# Patient Record
Sex: Female | Born: 1940 | Race: Black or African American | Hispanic: No | Smoking: Never smoker
Health system: Southern US, Community
[De-identification: ages and names within clinical notes are randomized; demographics above are authoritative.]

## PROBLEM LIST (undated history)

## (undated) DIAGNOSIS — C801 Malignant (primary) neoplasm, unspecified: Secondary | ICD-10-CM

## (undated) DIAGNOSIS — H409 Unspecified glaucoma: Secondary | ICD-10-CM

## (undated) DIAGNOSIS — M8580 Other specified disorders of bone density and structure, unspecified site: Secondary | ICD-10-CM

## (undated) DIAGNOSIS — R011 Cardiac murmur, unspecified: Secondary | ICD-10-CM

## (undated) DIAGNOSIS — I6529 Occlusion and stenosis of unspecified carotid artery: Secondary | ICD-10-CM

## (undated) DIAGNOSIS — R519 Headache, unspecified: Secondary | ICD-10-CM

## (undated) DIAGNOSIS — H3530 Unspecified macular degeneration: Secondary | ICD-10-CM

## (undated) DIAGNOSIS — IMO0001 Reserved for inherently not codable concepts without codable children: Secondary | ICD-10-CM

## (undated) DIAGNOSIS — M542 Cervicalgia: Secondary | ICD-10-CM

## (undated) DIAGNOSIS — M1990 Unspecified osteoarthritis, unspecified site: Secondary | ICD-10-CM

## (undated) DIAGNOSIS — E785 Hyperlipidemia, unspecified: Secondary | ICD-10-CM

## (undated) DIAGNOSIS — G459 Transient cerebral ischemic attack, unspecified: Secondary | ICD-10-CM

## (undated) DIAGNOSIS — D869 Sarcoidosis, unspecified: Secondary | ICD-10-CM

## (undated) DIAGNOSIS — I499 Cardiac arrhythmia, unspecified: Secondary | ICD-10-CM

## (undated) DIAGNOSIS — K635 Polyp of colon: Secondary | ICD-10-CM

## (undated) DIAGNOSIS — I341 Nonrheumatic mitral (valve) prolapse: Secondary | ICD-10-CM

## (undated) DIAGNOSIS — E7800 Pure hypercholesterolemia, unspecified: Secondary | ICD-10-CM

## (undated) DIAGNOSIS — R2240 Localized swelling, mass and lump, unspecified lower limb: Secondary | ICD-10-CM

## (undated) DIAGNOSIS — R51 Headache: Secondary | ICD-10-CM

## (undated) DIAGNOSIS — M1710 Unilateral primary osteoarthritis, unspecified knee: Secondary | ICD-10-CM

## (undated) HISTORY — DX: Polyp of colon: K63.5

## (undated) HISTORY — PX: TONSILLECTOMY: SUR1361

## (undated) HISTORY — PX: OTHER SURGICAL HISTORY: SHX169

## (undated) HISTORY — PX: EYE SURGERY: SHX253

## (undated) HISTORY — DX: Transient cerebral ischemic attack, unspecified: G45.9

## (undated) HISTORY — DX: Nonrheumatic mitral (valve) prolapse: I34.1

## (undated) HISTORY — PX: DILATION AND CURETTAGE OF UTERUS: SHX78

## (undated) HISTORY — DX: Unspecified macular degeneration: H35.30

## (undated) HISTORY — PX: ABDOMINAL HYSTERECTOMY: SHX81

## (undated) HISTORY — DX: Malignant (primary) neoplasm, unspecified: C80.1

## (undated) HISTORY — DX: Other specified disorders of bone density and structure, unspecified site: M85.80

## (undated) HISTORY — DX: Sarcoidosis, unspecified: D86.9

## (undated) HISTORY — DX: Cardiac arrhythmia, unspecified: I49.9

---

## 1998-07-07 ENCOUNTER — Ambulatory Visit (HOSPITAL_COMMUNITY): Admission: RE | Admit: 1998-07-07 | Discharge: 1998-07-07 | Payer: Self-pay | Admitting: Endocrinology

## 1998-07-09 ENCOUNTER — Ambulatory Visit (HOSPITAL_COMMUNITY): Admission: RE | Admit: 1998-07-09 | Discharge: 1998-07-09 | Payer: Self-pay | Admitting: Endocrinology

## 1998-07-09 ENCOUNTER — Encounter: Payer: Self-pay | Admitting: Endocrinology

## 1998-11-25 ENCOUNTER — Ambulatory Visit: Admission: RE | Admit: 1998-11-25 | Discharge: 1998-11-25 | Payer: Self-pay | Admitting: Cardiology

## 1999-05-19 ENCOUNTER — Emergency Department (HOSPITAL_COMMUNITY): Admission: EM | Admit: 1999-05-19 | Discharge: 1999-05-19 | Payer: Self-pay | Admitting: Emergency Medicine

## 2000-02-25 ENCOUNTER — Encounter: Admission: RE | Admit: 2000-02-25 | Discharge: 2000-02-25 | Payer: Self-pay | Admitting: Endocrinology

## 2000-02-25 ENCOUNTER — Encounter: Payer: Self-pay | Admitting: Endocrinology

## 2000-03-09 ENCOUNTER — Encounter: Payer: Self-pay | Admitting: Endocrinology

## 2000-03-09 ENCOUNTER — Encounter: Admission: RE | Admit: 2000-03-09 | Discharge: 2000-03-09 | Payer: Self-pay | Admitting: Endocrinology

## 2000-03-13 ENCOUNTER — Encounter: Payer: Self-pay | Admitting: Endocrinology

## 2000-03-13 ENCOUNTER — Inpatient Hospital Stay (HOSPITAL_COMMUNITY): Admission: EM | Admit: 2000-03-13 | Discharge: 2000-03-18 | Payer: Self-pay | Admitting: Emergency Medicine

## 2000-06-13 ENCOUNTER — Emergency Department (HOSPITAL_COMMUNITY): Admission: EM | Admit: 2000-06-13 | Discharge: 2000-06-13 | Payer: Self-pay | Admitting: Emergency Medicine

## 2000-07-06 ENCOUNTER — Encounter: Admission: RE | Admit: 2000-07-06 | Discharge: 2000-07-06 | Payer: Self-pay | Admitting: Endocrinology

## 2000-07-06 ENCOUNTER — Encounter: Payer: Self-pay | Admitting: Endocrinology

## 2000-12-01 ENCOUNTER — Ambulatory Visit: Admission: RE | Admit: 2000-12-01 | Discharge: 2000-12-01 | Payer: Self-pay | Admitting: Internal Medicine

## 2000-12-12 ENCOUNTER — Ambulatory Visit: Admission: RE | Admit: 2000-12-12 | Discharge: 2000-12-12 | Payer: Self-pay | Admitting: Internal Medicine

## 2001-05-21 ENCOUNTER — Emergency Department (HOSPITAL_COMMUNITY): Admission: EM | Admit: 2001-05-21 | Discharge: 2001-05-21 | Payer: Self-pay | Admitting: Emergency Medicine

## 2001-05-21 ENCOUNTER — Encounter: Payer: Self-pay | Admitting: Emergency Medicine

## 2001-09-11 ENCOUNTER — Other Ambulatory Visit: Admission: RE | Admit: 2001-09-11 | Discharge: 2001-09-11 | Payer: Self-pay | Admitting: Gynecology

## 2001-10-09 ENCOUNTER — Emergency Department (HOSPITAL_COMMUNITY): Admission: EM | Admit: 2001-10-09 | Discharge: 2001-10-09 | Payer: Self-pay | Admitting: Emergency Medicine

## 2002-01-04 ENCOUNTER — Ambulatory Visit (HOSPITAL_COMMUNITY): Admission: RE | Admit: 2002-01-04 | Discharge: 2002-01-04 | Payer: Self-pay | Admitting: *Deleted

## 2002-11-09 ENCOUNTER — Encounter: Payer: Self-pay | Admitting: Endocrinology

## 2002-11-09 ENCOUNTER — Ambulatory Visit (HOSPITAL_COMMUNITY): Admission: RE | Admit: 2002-11-09 | Discharge: 2002-11-09 | Payer: Self-pay | Admitting: Endocrinology

## 2003-04-03 ENCOUNTER — Encounter: Payer: Self-pay | Admitting: Endocrinology

## 2003-04-03 ENCOUNTER — Encounter: Admission: RE | Admit: 2003-04-03 | Discharge: 2003-04-03 | Payer: Self-pay | Admitting: Endocrinology

## 2003-10-22 ENCOUNTER — Emergency Department (HOSPITAL_COMMUNITY): Admission: EM | Admit: 2003-10-22 | Discharge: 2003-10-22 | Payer: Self-pay | Admitting: Emergency Medicine

## 2004-04-23 ENCOUNTER — Encounter: Admission: RE | Admit: 2004-04-23 | Discharge: 2004-04-23 | Payer: Self-pay | Admitting: Endocrinology

## 2004-05-02 ENCOUNTER — Emergency Department (HOSPITAL_COMMUNITY): Admission: EM | Admit: 2004-05-02 | Discharge: 2004-05-02 | Payer: Self-pay | Admitting: Emergency Medicine

## 2004-09-25 ENCOUNTER — Ambulatory Visit: Payer: Self-pay | Admitting: Internal Medicine

## 2005-01-07 ENCOUNTER — Ambulatory Visit: Payer: Self-pay | Admitting: Internal Medicine

## 2005-02-10 ENCOUNTER — Ambulatory Visit: Payer: Self-pay | Admitting: Internal Medicine

## 2005-04-12 ENCOUNTER — Emergency Department (HOSPITAL_COMMUNITY): Admission: EM | Admit: 2005-04-12 | Discharge: 2005-04-12 | Payer: Self-pay | Admitting: *Deleted

## 2005-04-13 ENCOUNTER — Ambulatory Visit: Payer: Self-pay | Admitting: Internal Medicine

## 2005-04-28 ENCOUNTER — Ambulatory Visit: Payer: Self-pay | Admitting: Internal Medicine

## 2005-05-27 ENCOUNTER — Ambulatory Visit: Payer: Self-pay | Admitting: Internal Medicine

## 2005-07-08 ENCOUNTER — Ambulatory Visit: Payer: Self-pay | Admitting: Internal Medicine

## 2005-07-21 ENCOUNTER — Ambulatory Visit: Payer: Self-pay | Admitting: Pulmonary Disease

## 2005-07-27 ENCOUNTER — Ambulatory Visit: Payer: Self-pay | Admitting: Internal Medicine

## 2005-07-27 ENCOUNTER — Encounter: Admission: RE | Admit: 2005-07-27 | Discharge: 2005-07-27 | Payer: Self-pay | Admitting: Endocrinology

## 2005-08-10 ENCOUNTER — Ambulatory Visit: Payer: Self-pay | Admitting: Internal Medicine

## 2005-08-30 ENCOUNTER — Ambulatory Visit: Payer: Self-pay | Admitting: Pulmonary Disease

## 2005-09-23 ENCOUNTER — Ambulatory Visit: Payer: Self-pay | Admitting: Internal Medicine

## 2005-12-22 ENCOUNTER — Ambulatory Visit: Payer: Self-pay | Admitting: Internal Medicine

## 2006-01-17 ENCOUNTER — Ambulatory Visit: Payer: Self-pay | Admitting: Internal Medicine

## 2006-02-14 ENCOUNTER — Ambulatory Visit: Payer: Self-pay | Admitting: Internal Medicine

## 2006-03-23 ENCOUNTER — Emergency Department (HOSPITAL_COMMUNITY): Admission: EM | Admit: 2006-03-23 | Discharge: 2006-03-24 | Payer: Self-pay | Admitting: Emergency Medicine

## 2006-03-25 ENCOUNTER — Ambulatory Visit: Payer: Self-pay | Admitting: Internal Medicine

## 2006-04-05 ENCOUNTER — Ambulatory Visit: Payer: Self-pay | Admitting: Internal Medicine

## 2006-07-06 ENCOUNTER — Ambulatory Visit: Payer: Self-pay | Admitting: Critical Care Medicine

## 2006-07-13 ENCOUNTER — Ambulatory Visit: Payer: Self-pay | Admitting: Internal Medicine

## 2006-07-28 ENCOUNTER — Ambulatory Visit (HOSPITAL_COMMUNITY): Admission: RE | Admit: 2006-07-28 | Discharge: 2006-07-29 | Payer: Self-pay | Admitting: Ophthalmology

## 2006-08-31 ENCOUNTER — Ambulatory Visit: Payer: Self-pay | Admitting: Internal Medicine

## 2006-10-11 ENCOUNTER — Ambulatory Visit: Payer: Self-pay | Admitting: Internal Medicine

## 2007-01-23 ENCOUNTER — Ambulatory Visit: Payer: Self-pay | Admitting: Internal Medicine

## 2007-04-24 ENCOUNTER — Ambulatory Visit: Payer: Self-pay | Admitting: Internal Medicine

## 2007-05-15 ENCOUNTER — Ambulatory Visit: Payer: Self-pay | Admitting: Internal Medicine

## 2007-07-04 ENCOUNTER — Emergency Department (HOSPITAL_COMMUNITY): Admission: EM | Admit: 2007-07-04 | Discharge: 2007-07-04 | Payer: Self-pay | Admitting: Emergency Medicine

## 2007-07-21 ENCOUNTER — Ambulatory Visit: Payer: Self-pay | Admitting: Internal Medicine

## 2007-08-22 DIAGNOSIS — R05 Cough: Secondary | ICD-10-CM

## 2007-08-22 DIAGNOSIS — D869 Sarcoidosis, unspecified: Secondary | ICD-10-CM

## 2007-09-14 ENCOUNTER — Telehealth (INDEPENDENT_AMBULATORY_CARE_PROVIDER_SITE_OTHER): Payer: Self-pay | Admitting: *Deleted

## 2007-10-02 ENCOUNTER — Telehealth (INDEPENDENT_AMBULATORY_CARE_PROVIDER_SITE_OTHER): Payer: Self-pay | Admitting: *Deleted

## 2007-10-02 ENCOUNTER — Telehealth (INDEPENDENT_AMBULATORY_CARE_PROVIDER_SITE_OTHER): Payer: Self-pay

## 2007-11-30 ENCOUNTER — Ambulatory Visit: Payer: Self-pay | Admitting: Internal Medicine

## 2008-02-05 ENCOUNTER — Ambulatory Visit: Payer: Self-pay | Admitting: Internal Medicine

## 2008-05-13 ENCOUNTER — Telehealth (INDEPENDENT_AMBULATORY_CARE_PROVIDER_SITE_OTHER): Payer: Self-pay | Admitting: *Deleted

## 2008-08-06 ENCOUNTER — Ambulatory Visit: Payer: Self-pay | Admitting: Internal Medicine

## 2008-08-06 DIAGNOSIS — J984 Other disorders of lung: Secondary | ICD-10-CM

## 2008-11-17 ENCOUNTER — Emergency Department (HOSPITAL_COMMUNITY): Admission: EM | Admit: 2008-11-17 | Discharge: 2008-11-17 | Payer: Self-pay | Admitting: Emergency Medicine

## 2009-01-10 ENCOUNTER — Encounter: Payer: Self-pay | Admitting: Internal Medicine

## 2009-01-10 ENCOUNTER — Ambulatory Visit: Payer: Self-pay | Admitting: Internal Medicine

## 2009-02-05 ENCOUNTER — Ambulatory Visit: Payer: Self-pay | Admitting: Internal Medicine

## 2009-02-26 ENCOUNTER — Emergency Department (HOSPITAL_COMMUNITY): Admission: EM | Admit: 2009-02-26 | Discharge: 2009-02-26 | Payer: Self-pay | Admitting: Emergency Medicine

## 2009-04-04 ENCOUNTER — Ambulatory Visit: Payer: Self-pay | Admitting: Internal Medicine

## 2009-05-08 ENCOUNTER — Ambulatory Visit: Payer: Self-pay | Admitting: Internal Medicine

## 2009-07-20 ENCOUNTER — Ambulatory Visit: Payer: Self-pay | Admitting: Vascular Surgery

## 2009-07-20 ENCOUNTER — Ambulatory Visit: Payer: Self-pay | Admitting: Cardiology

## 2009-07-20 ENCOUNTER — Observation Stay (HOSPITAL_COMMUNITY): Admission: EM | Admit: 2009-07-20 | Discharge: 2009-07-22 | Payer: Self-pay | Admitting: Emergency Medicine

## 2009-07-21 ENCOUNTER — Encounter (INDEPENDENT_AMBULATORY_CARE_PROVIDER_SITE_OTHER): Payer: Self-pay | Admitting: Internal Medicine

## 2009-08-11 ENCOUNTER — Telehealth: Payer: Self-pay | Admitting: Internal Medicine

## 2009-08-18 HISTORY — PX: OTHER SURGICAL HISTORY: SHX169

## 2009-08-19 ENCOUNTER — Ambulatory Visit: Payer: Self-pay | Admitting: Internal Medicine

## 2009-09-02 ENCOUNTER — Ambulatory Visit: Payer: Self-pay | Admitting: Pulmonary Disease

## 2009-09-08 ENCOUNTER — Encounter: Payer: Self-pay | Admitting: Internal Medicine

## 2009-09-08 ENCOUNTER — Ambulatory Visit: Payer: Self-pay | Admitting: Internal Medicine

## 2009-11-07 ENCOUNTER — Ambulatory Visit: Payer: Self-pay | Admitting: Internal Medicine

## 2009-12-26 ENCOUNTER — Encounter: Payer: Self-pay | Admitting: Internal Medicine

## 2009-12-26 ENCOUNTER — Telehealth: Payer: Self-pay | Admitting: Internal Medicine

## 2009-12-30 ENCOUNTER — Ambulatory Visit (HOSPITAL_COMMUNITY): Admission: RE | Admit: 2009-12-30 | Discharge: 2009-12-31 | Payer: Self-pay | Admitting: Ophthalmology

## 2010-01-16 ENCOUNTER — Ambulatory Visit: Payer: Self-pay | Admitting: Internal Medicine

## 2010-01-17 ENCOUNTER — Emergency Department (HOSPITAL_COMMUNITY): Admission: EM | Admit: 2010-01-17 | Discharge: 2010-01-17 | Payer: Self-pay | Admitting: Emergency Medicine

## 2010-05-04 ENCOUNTER — Ambulatory Visit: Payer: Self-pay | Admitting: Internal Medicine

## 2010-05-15 ENCOUNTER — Emergency Department (HOSPITAL_COMMUNITY): Admission: EM | Admit: 2010-05-15 | Discharge: 2010-05-15 | Payer: Self-pay | Admitting: Emergency Medicine

## 2010-05-18 ENCOUNTER — Emergency Department (HOSPITAL_COMMUNITY): Admission: EM | Admit: 2010-05-18 | Discharge: 2010-05-18 | Payer: Self-pay | Admitting: Emergency Medicine

## 2010-05-20 ENCOUNTER — Ambulatory Visit: Payer: Self-pay | Admitting: Internal Medicine

## 2010-05-20 DIAGNOSIS — L272 Dermatitis due to ingested food: Secondary | ICD-10-CM | POA: Insufficient documentation

## 2010-05-21 ENCOUNTER — Encounter: Payer: Self-pay | Admitting: Internal Medicine

## 2010-06-03 ENCOUNTER — Ambulatory Visit: Payer: Self-pay | Admitting: Internal Medicine

## 2010-06-09 ENCOUNTER — Encounter: Payer: Self-pay | Admitting: Internal Medicine

## 2010-08-25 ENCOUNTER — Ambulatory Visit: Payer: Self-pay | Admitting: Internal Medicine

## 2010-09-10 ENCOUNTER — Emergency Department (HOSPITAL_COMMUNITY): Admission: EM | Admit: 2010-09-10 | Discharge: 2010-09-10 | Payer: Self-pay | Admitting: Emergency Medicine

## 2010-11-26 NOTE — Assessment & Plan Note (Signed)
Summary: Pulmonary/ f/u ov doing well   Primary Provider/Referring Provider:  Rosine Abe  CC:  Cough- "doing fine"..  History of Present Illness: 70 yobf  never smoker diagnosed with sarcoidosis with parenchymal and airway involvement in 1983 at [facility].  At one point she also had significant sinus involvement and had remained prednisone dependent since 1983 and stopped without flare  06/2008  August 06, 2008 ov no prednisone x 1 month(inadvertently stopped without adverse effects)  and no symptoms no am nausea or breathing no worse on servent and qvar   January 10, 2009 most of the time when she leaves off serevent can't feel the difference in terms of doe or cough.   rec dc serevent 2 days before PFT's  February 05, 2009 off serevent, using qvar 80 2 two times a day with no increase need for rescue vs while on serevent. rec leave off serevent   September 02, 2009-- follow up and med reivew Doing very well. Cough  resolved. We discussed that she should stay off bisphosphanates d/t potential for GI irritaiton . She will discuss alternative therapy with her primary  We disuccsed her medication and made a med calendar today.   November 07, 2009 2 month followup.  Pt states that she had some increased SOB a few wks ago.  She states that she relates this to the change in weather.  She states that she seems to be doing better now.  Denies any complaints today. did not think to use maxair even though it's listed clearly in action plan to do so for increased sob.   January 16, 2010 3 month followup.  Pt states no complaints today.  Breathing is doing well. no cough.   May 04, 2010 3 month followup.  Pt states that her breathing has been okay as long as she does not stay outside in the hot weather for too long.  She denies cough but c/o hoarsness x 1 wk.    rec consider off biphosphonates  May 20, 2010 Acute visit.  Pt c/o prod cough with yellow to green sputum x 4 days.  She states "coughs all night long".   She started biaxin 500 mg bid x 4 days ago with no improvement.   c/o mouth allergies from foods, lost list of food allergies supplied by [facility] and wants f/u here.    >>steroid taper  June 03, 2010 -Presents for follow up and med review. Seen last visit w/ persistent cough tx w/ steroid taper. Says her cough is improved. Has upcoming visit with Dr. Sharyn Lull to evaluate for possible food allergies. We reveiwed her meds and updated her med calendar. She is feeling better more toward her baseline. She remains off  bisphosphonates.  rec no change rx, follow med calendar  August 25, 2010 ov cc  cough better, breathing ok though not very active.  Pt denies any significant sore throat, dysphagia, itching, sneezing,  nasal congestion or excess secretions,  fever, chills, sweats, unintended wt loss, pleuritic or exertional cp, hempoptysis, change in activity tolerance  orthopnea pnd or leg swelling.  Pt also denies any obvious fluctuation in symptoms with weather or environmental change or other alleviating or aggravating factors.       Current Medications (verified): 1)  Centrum Silver   Tabs (Multiple Vitamins-Minerals) .... Once Daily 2)  Caltrate 600 1500 Mg  Tabs (Calcium Carbonate) .... 2 Tabs By Mouth Once Daily 3)  Qvar 80 Mcg/act  Aers (Beclomethasone Dipropionate) .... Two  Puffs Twice Daily 4)  Prilosec Otc 20 Mg Tbec (Omeprazole Magnesium) .... Take One 30-60 Min Before First Meal of The Day 5)  Klor-Con M20 20 Meq Cr-Tabs (Potassium Chloride Crys Cr) .Marland Kitchen.. 1 Tab  Every Morning and 1 At Bedtime 6)  Vitamin D 1000 Unit Tabs (Cholecalciferol) .Marland Kitchen.. 1 Once Daily 7)  Mucinex Dm 30-600 Mg Xr12h-Tab (Dextromethorphan-Guaifenesin) .Marland Kitchen.. 1 -2 Every 12 Hours As Needed For Cough or Congestion 8)  Maxair Autohaler 200 Mcg/inh Aerb (Pirbuterol Acetate) .... 2 Puffs Every 4 Hours As Needed  Allergies (verified): 1)  ! Asa 2)  ! 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 Lucas Mallow Osteopenia..     - DEXA 09/08/2009  T spine 1.2,   L Fem Neck -1.6,  RFem neck -1.9 Food allergies dx at [facility]      - Referred to [facility] for food allergies May 20, 2010   Vital Signs:  Patient profile:   70 year old female Weight:      158.38 pounds O2 Sat:      94 % on Room air Temp:     97.5 degrees F oral Pulse rate:   91 / minute BP sitting:   132 / 72  (left arm)  Vitals Entered By: Vernie Murders (August 25, 2010 9:07 AM)  O2 Flow:  Room air  Physical Exam  Additional Exam:  anxious but pleasant amb bf nad  wt  160 August 19, 2009 > 170 January 16, 2010 > 162 May 04, 2010 > 158 May 20, 2010 >>157 06/03/10 > 158 August 26, 2010  HEENT mild turbinate edema.  Oropharynx no thrush or excess pnd or cobblestoning.  No JVD or cervical adenopathy. Mild accessory muscle hypertrophy. Trachea midline, nl thryroid. Chest was hyperinflated by percussion with diminished breath sounds and moderate increased exp time without wheeze. Hoover sign positive at mid inspiration. Regular rate and rhythm without murmur gallop or rub or increase P2 or edema.  Abd: no hsm, nl excursion. Ext warm without cyanosis or clubbing.     Impression & Recommendations:  Problem # 1:  SARCOIDOSIS (ICD-135) Parenchymal and airway involvement apparent, doing very well on present rx.   Each maintenance medication was reviewed in detail including most importantly the difference between maintenance and as needed and under what circumstances the prns are to be used. This was done in the context of a  medication calendar review which provided the patient with a user-friendly unambiguous mechanism for medication administration and reconciliation and provides an action plan for all active problems. It is critical that this be shown to every doctor  for modification during the office visit if necessary so the patient can use it as a working document.   Other Orders: Est. Patient Level III (04540)  Patient Instructions: 1)  See calendar for specific medication instructions and bring it back for each and every office visit for every healthcare provider you see.  Without it,  you may not receive the best quality medical care that we feel you deserve.  2)  Return to office in 3 months, sooner if needed  3)  Copy sent to: Kirby Funk

## 2010-11-26 NOTE — Letter (Signed)
Summary: [facility]  [facility]   Imported By: Sherian Rein 05/28/2010 10:10:41  _____________________________________________________________________  External Attachment:    Type:   Image     Comment:   External Document

## 2010-11-26 NOTE — Consult Note (Signed)
Summary: [facility]  [facility]   Imported By: Sherian Rein 06/30/2010 08:28:32  _____________________________________________________________________  External Attachment:    Type:   Image     Comment:   External Document

## 2010-11-26 NOTE — Assessment & Plan Note (Signed)
Summary: Pulmonary/ f/u sarcoidosis   Primary Provider/Referring Provider:  Lynnae Sandhoff   CC:  3 month followup.  Pt states no complaints today.  Breathing is doing well.Lisa [name]  History of Present Illness: 70  yobf  never smoker diagnosed with sarcoidosis with parenchymal and airway involvement in 1983 at [facility].  At one point she also had significant sinus involvement and had remained prednisone dependent since 1983.    August 06, 2008 ov no prednisone x 1 month(inadvertently stopped without adverse effects)  and no symptoms no am nausea or breathing no worse on servent and qvar   January 10, 2009 most of the time when she leaves off serevent can't feel the difference in terms of doe or cough.   rec dc serevent 2 days before PFT's  February 05, 2009 off serevent, using qvar 80 2 two times a day with no increase need for rescue vs while on serevent. rec leave off serevent  April 04, 2009 ov no increase sob off steroids since 9/009 nor nausea, fatigue occular or articular c/o's rash or cough. rec no change in rx  May 08, 2009 here for f/u all smiles, no cough or limiting sob.   August 19, 2009 ov p hosp late september for low bp/ dehydration secondary to dehydrated.3 month followup.  Pt c/o "a cold" x 2 wks.  She has prod cough with greyish yellow sputum and states that "breathing is heavy".  rx with biaxin finished 10/23 some better.   September 02, 2009-- follow up and med reivew Doing very well. Cough  resolved. We discussed that she should stay off bisphosphanates d/t potential for GI irritaiton . She will discuss alternative therapy with her primary  We disuccsed her medication and made a med calendar today. November 07, 2009 2 month followup.  Pt states that she had some increased SOB a few wks ago.  She states that she relates this to the change in weather.  She states that she seems to be doing better now.  Denies any complaints today. did not think to use maxair even though it's listed clearly in  action plan to do so for increased sob.   January 16, 2010 3 month followup.  Pt states no complaints today.  Breathing is doing well. no cough. Pt denies any significant sore throat, dysphagia, itching, sneezing,  nasal congestion or excess secretions,  fever, chills, sweats, unintended wt loss, pleuritic or exertional cp, hempoptysis, change in activity tolerance  orthopnea pnd or leg swelling Pt also denies any obvious fluctuation in symptoms with weather or environmental change or other alleviating or aggravating factors.     Pt denies any increase in rescue therapy over baseline, denies waking up needing it or having early am exacerbations of coughing/wheezing/ or dyspnea   Current Medications (verified): 1)  Centrum Silver   Tabs (Multiple Vitamins-Minerals) .... Once Daily 2)  Caltrate 600 1500 Mg  Tabs (Calcium Carbonate) .... 2 Tabs By Mouth Once Daily 3)  Qvar 80 Mcg/act  Aers (Beclomethasone Dipropionate) .... Two  Puffs Twice Daily 4)  Prilosec Otc 20 Mg Tbec (Omeprazole Magnesium) .... Take One 30-60 Min Before First Meal of The Day 5)  Mucinex Dm 30-600 Mg Xr12h-Tab (Dextromethorphan-Guaifenesin) .Lisa [name].. 1 -2 Every 12 Hours As Needed For Cough or Congestion 6)  Drixoral Cold/allergy 6-120 Mg Xr12h-Tab (Dexbrompheniramine-Pseudoeph) .... As Directed When Needed 7)  Tramadol Hcl 50 Mg  Tabs (Tramadol Hcl) .Lisa [name].. 1 By Mouth Every 4 Hours As Needed For Cough or Severe  Pain 8)  Lidoderm 5 % Ptch (Lidocaine) .... Apply 1 Patch Daily For 12 Hours As Needed Neck/back Pain 9)  Klor-Con M20 20 Meq Cr-Tabs (Potassium Chloride Crys Cr) .... As Directed When Needed 10)  Maxair Autohaler 200 Mcg/inh Aerb (Pirbuterol Acetate) .... 2 Puffs Every 4 Hours As Needed 11)  Vitamin D 1000 Unit Tabs (Cholecalciferol) .Lisa [name].. 1 Once Daily 12)  Alendronate Sodium 70 Mg Tabs (Alendronate Sodium) .Lisa [name].. 1 Every Week  Allergies (verified): 1)  ! Asa 2)  ! 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 Lucas Mallow Osteopenia     - DEXA 09/08/2009  T spine 1.2,   L Fem Neck -1.6,  RFem neck -1.9  Vital Signs:  Patient profile:   70 year old female Weight:      170 pounds O2 Sat:      96 % on Room air Temp:     98.1 degrees F oral Pulse rate:   68 / minute BP sitting:   122 / 68  (left arm)  Vitals Entered By: Vernie Murders (January 16, 2010 9:02 AM)  O2 Flow:  Room air  Physical Exam  Additional Exam:  anxious but pleasant amb bf nad   wt 167 > 160 August 19, 2009 >>170 September 02, 2009 > 168 November 08, 2009 > 170 January 16, 2010  HEENT mild turbinate edema.  Oropharynx no thrush or excess pnd or cobblestoning.  No JVD or cervical adenopathy. Mild accessory muscle hypertrophy. Trachea midline, nl thryroid. Chest was hyperinflated by percussion with diminished breath sounds and moderate increased exp time without wheeze. Hoover sign positive at mid inspiration. Regular rate and rhythm without murmur gallop or rub or increase P2 or edema.  Abd: no hsm, nl excursion. Ext warm without cyanosis or clubbing.     Impression & Recommendations:  Problem # 1:  SARCOIDOSIS (ICD-135) may have airway involvement or primary asthma and needs to follow her written action plan but having trouble keeping up with med calendar provided.  Luckily her meds are quite straightforward.  All goals of asthma met including optimal function and elimination of symptoms with minimum need for rescue therapy. Contingencies discussed today including the rule of two's.    Each  maintenance medication was reviewed in detail including most importantly the difference between maintenance and as needed and under what circumstances the prns are to be used. See instructions for specific recommendations   Medications Added to Medication List This Visit: 1)  Vitamin D 1000 Unit Tabs (Cholecalciferol) .Lisa [name].. 1 once daily 2)  Alendronate Sodium 70 Mg Tabs (Alendronate sodium) .Lisa [name].. 1 every week  Other Orders: Est. Patient Level III (16109)  Patient Instructions: 1)  Return to office in 3 months, sooner if needed

## 2010-11-26 NOTE — Assessment & Plan Note (Signed)
Summary: Pulmonary/ f/u sarcoidosis   Primary Provider/Referring Provider:  Lynnae Sandhoff   CC:  3 month followup.  Pt states that her breathing has been okay as long as she does not stay outside in the hot weather for too long.  She denies cough but c/o hoarsness x 1 wk.  .  History of Present Illness: 58  yobf  never smoker diagnosed with sarcoidosis with parenchymal and airway involvement in 1983 at [facility].  At one point she also had significant sinus involvement and had remained prednisone dependent since 1983.    August 06, 2008 ov no prednisone x 1 month(inadvertently stopped without adverse effects)  and no symptoms no am nausea or breathing no worse on servent and qvar   January 10, 2009 most of the time when she leaves off serevent can't feel the difference in terms of doe or cough.   rec dc serevent 2 days before PFT's  February 05, 2009 off serevent, using qvar 80 2 two times a day with no increase need for rescue vs while on serevent. rec leave off serevent  April 04, 2009 ov no increase sob off steroids since 9/009 nor nausea, fatigue occular or articular c/o's rash or cough. rec no change in rx  May 08, 2009 here for f/u all smiles, no cough or limiting sob.   August 19, 2009 ov p hosp late september for low bp/ dehydration secondary to dehydrated.3 month followup.  Pt c/o "a cold" x 2 wks.  She has prod cough with greyish yellow sputum and states that "breathing is heavy".  rx with biaxin finished 10/23 some better.   September 02, 2009-- follow up and med reivew Doing very well. Cough  resolved. We discussed that she should stay off bisphosphanates d/t potential for GI irritaiton . She will discuss alternative therapy with her primary  We disuccsed her medication and made a med calendar today. November 07, 2009 2 month followup.  Pt states that she had some increased SOB a few wks ago.  She states that she relates this to the change in weather.  She states that she seems to be doing  better now.  Denies any complaints today. did not think to use maxair even though it's listed clearly in action plan to do so for increased sob.   January 16, 2010 3 month followup.  Pt states no complaints today.  Breathing is doing well. no cough.   May 04, 2010 3 month followup.  Pt states that her breathing has been okay as long as she does not stay outside in the hot weather for too long.  She denies cough but c/o hoarsness x 1 wk.   Pt denies any significant sore throat, dysphagia, itching, sneezing,  nasal congestion or excess secretions,  fever, chills, sweats, unintended wt loss, pleuritic or exertional cp, hempoptysis, change in activity tolerance  orthopnea pnd or leg swelling   Current Medications (verified): 1)  Centrum Silver   Tabs (Multiple Vitamins-Minerals) .... Once Daily 2)  Caltrate 600 1500 Mg  Tabs (Calcium Carbonate) .... 2 Tabs By Mouth Once Daily 3)  Qvar 80 Mcg/act  Aers (Beclomethasone Dipropionate) .... Two  Puffs Twice Daily 4)  Prilosec Otc 20 Mg Tbec (Omeprazole Magnesium) .... Take One 30-60 Min Before First Meal of The Day 5)  Mucinex Dm 30-600 Mg Xr12h-Tab (Dextromethorphan-Guaifenesin) .Marland Kitchen.. 1 -2 Every 12 Hours As Needed For Cough or Congestion 6)  Drixoral Cold/allergy 6-120 Mg Xr12h-Tab (Dexbrompheniramine-Pseudoeph) .... As Directed When Needed  7)  Tramadol Hcl 50 Mg  Tabs (Tramadol Hcl) .Marland Kitchen.. 1 By Mouth Every 4 Hours As Needed For Cough or Severe Pain 8)  Lidoderm 5 % Ptch (Lidocaine) .... Apply 1 Patch Daily For 12 Hours As Needed Neck/back Pain 9)  Klor-Con M20 20 Meq Cr-Tabs (Potassium Chloride Crys Cr) .... As Directed When Needed 10)  Maxair Autohaler 200 Mcg/inh Aerb (Pirbuterol Acetate) .... 2 Puffs Every 4 Hours As Needed 11)  Vitamin D 1000 Unit Tabs (Cholecalciferol) .Marland Kitchen.. 1 Once Daily 12)  Alendronate Sodium 70 Mg Tabs (Alendronate Sodium) .Marland Kitchen.. 1 Every Week  Allergies (verified): 1)  ! Asa 2)  ! Valium  Past History:  Past Medical  History: SARCOIDOSIS (ICD-135)     - On prednisone   1983 ([facility]) > off 9/09 ([place])     - PFT's 04/24/07 FEV1 1.62 (72%) ratio 63, no resp to Bronchodilators, DLC0 61% (on prednisone)     - PFT's 02/05/09 FEV1 1.59 (72%) ratio 66  no resp to bronchodilators DLC0 63 (off prednisone)     - HFA technique 100% February 05, 2009 with autohaler MVP syndrome.............................................................................................Marland Kitchen Dr. Lucas Mallow Osteopenia     - DEXA 09/08/2009  T spine 1.2,   L Fem Neck -1.6,  RFem neck -1.9  Vital Signs:  Patient profile:   70 year old female Weight:      162 pounds O2 Sat:      99 % on Room air Temp:     98.1 degrees F oral Pulse rate:   89 / minute BP sitting:   144 / 78  (left arm)  Vitals Entered By: Vernie Murders (May 04, 2010 9:49 AM)  O2 Flow:  Room air  Physical Exam  Additional Exam:  anxious but pleasant amb bf nad   wt  160 August 19, 2009 >>170 September 02, 2009 > 168 November 08, 2009 > 170 January 16, 2010 > 162 May 04, 2010  HEENT mild turbinate edema.  Oropharynx no thrush or excess pnd or cobblestoning.  No JVD or cervical adenopathy. Mild accessory muscle hypertrophy. Trachea midline, nl thryroid. Chest was hyperinflated by percussion with diminished breath sounds and moderate increased exp time without wheeze. Hoover sign positive at mid inspiration. Regular rate and rhythm without murmur gallop or rub or increase P2 or edema.  Abd: no hsm, nl excursion. Ext warm without cyanosis or clubbing.     Impression & Recommendations:  Problem # 1:  SARCOIDOSIS (ICD-135) no  evidence of dz activity at this point with airway control on qvar.  Each maintenance medication was reviewed in detail including most importantly the difference between maintenance prns and under what circumstances the prns are to be used.  In addition, these two groups (for which the patient should keep up with refills) were distinguished from a third group :   meds that are used only short term with the intent to complete a course of therapy and then not refill them.  The med list was then fully reconciled and reorganized to reflect this important distinction.   Medications Added to Medication List This Visit: 1)  Klor-con M20 20 Meq Cr-tabs (Potassium chloride crys cr) .... 2 daily  Other Orders: Est. Patient Level III (16109)  Patient Instructions: 1)  If hoarseness persists or you have worsening respiratory symptoms or need for Maxair  may need to consider the IV substitiute for alendronate = Reclast once yearly 2)  Return to office in 3 months, sooner if needed

## 2010-11-26 NOTE — Progress Notes (Signed)
Summary: form  Phone Note Call from Patient   Caller: Patient Call For: Sheera Illingworth Summary of Call: pt need form filled out and faxed gave to leslie Initial call taken by: Rickard Patience,  December 26, 2009 11:30 AM  Follow-up for Phone Call        gave form to leslie. Carron Curie CMA  December 26, 2009 11:49 AM done Follow-up by: Nyoka Cowden MD,  December 26, 2009 3:02 PM  Additional Follow-up for Phone Call Additional follow up Details #1::        form faxed. placed in scan folder. Carron Curie CMA  December 26, 2009 3:26 PM

## 2010-11-26 NOTE — Assessment & Plan Note (Signed)
Summary: Pulmonary/ f/u sarcoidosis   Primary Provider/Referring Provider:  Lynnae Sandhoff   CC:  2 month followup.  Pt states that she had some increased SOB a few wks ago.  She states that she relates this to the change in weather.  She states that she seems to be doing better now.  Denies any complaints today.Lisa [name]  History of Present Illness: 70 yobfbf  never smoker diagnosed with sarcoidosis with parenchymal and airway involvement in 1983 at [facility].  At one point she also had significant sinus involvement and had remained prednisone dependent since 1983.    August 06, 2008 ov no prednisone x 1 month(inadvertently stopped without adverse effects)  and no symptoms no am nausea or breathing no worse on servent and qvar   January 10, 2009 most of the time when she leaves off serevent can't feel the difference in terms of doe or cough.   rec dc serevent 2 days before PFT's  February 05, 2009 off serevent, using qvar 80 2 two times a day with no increase need for rescue vs while on serevent. rec leave off serevent  April 04, 2009 ov no increase sob off steroids since 9/009 nor nausea, fatigue occular or articular c/o's rash or cough. rec no change in rx  May 08, 2009 here for f/u all smiles, no cough or limiting sob.   August 19, 2009 ov p hosp late september for low bp/ dehydration secondary to dehydrated.3 month followup.  Pt c/o "a cold" x 2 wks.  She has prod cough with greyish yellow sputum and states that "breathing is heavy".  rx with biaxin finished 10/23 some better.   September 02, 2009-- follow up and med reivew Doing very well. Cough  resolved. We discussed that she should stay off bisphosphanates d/t potential for GI irritaiton . She will discuss alternative therapy with her primary  We disuccsed her medication and made a med calendar today. November 07, 2009 2 month followup. Pt states that she had some increased SOB a few wks ago.  Pt states that she had some increased SOB a few wks ago.  She states that she relates this to the change in weather.   She states that she seems to be doing better now.  Denies any complaints today. did not think to use maxair even though it's listed clearly in action plan to do so for increased sob.  Pt denies any significant sore throat, dysphagia, itching, sneezing,  nasal congestion or excess secretions,  fever, chills, sweats, unintended wt loss, pleuritic or exertional cp, hempoptysis, change in activity tolerance  orthopnea pnd or leg swelling   Current Medications (verified): 1)  Centrum Silver   Tabs (Multiple Vitamins-Minerals) .... Once Daily 2)  Caltrate 600 1500 Mg  Tabs (Calcium Carbonate) .... 2 Tabs By Mouth Once Daily 3)  Qvar 80 Mcg/act  Aers (Beclomethasone Dipropionate) .... Two  Puffs Twice Daily 4)  Prilosec Otc 20 Mg Tbec (Omeprazole Magnesium) .... Take One 30-60 Min Before First Meal of The Day 5)  Mucinex Dm 30-600 Mg Xr12h-Tab (Dextromethorphan-Guaifenesin) .Lisa [name].. 1 -2 Every 12 Hours As Needed For Cough or Congestion 6)  Drixoral Cold/allergy 6-120 Mg Xr12h-Tab (Dexbrompheniramine-Pseudoeph) .... As Directed When Needed 7)  Tramadol Hcl 50 Mg  Tabs (Tramadol Hcl) .Lisa [name].. 1 By Mouth Every 4 Hours As Needed For Cough or Severe Pain 8)  Lidoderm 5 % Ptch (Lidocaine) .... Apply 1 Patch Daily For 12 Hours As Needed Neck/back Pain 9)  Klor-Con M20 20 Meq Cr-Tabs (Potassium Chloride Crys Cr) .... As Directed When Needed  Allergies (verified): 1)  ! Asa 2)  ! Valium 3)  Valium 4)  Asa  Past History:  Past Medical History: SARCOIDOSIS (ICD-135)     - On prednisone   1983 ([facility]) > off 9/09 ([facility])     - PFT's 04/24/07 FEV1 1.62 (72%) ratio 63, no resp to Bronchodilators, DLC0 61% (on prednisone)     - PFT's 02/05/09 FEV1 1.59 (72%) ratio 66  no resp to bronchodilators DLC0 63 (off prednisone)     - HFA technique 100% February 05, 2009 with autohaler MVP syndrome.........................................................................................Lisa [name] Dr. Lucas Mallow Osteopenia     - DEXA 09/08/2009  T  spine 1.2,   L Fem Neck -1.6,  RFem neck -1.9  Vital Signs:  Patient profile:   70 year old female Weight:      168 pounds O2 Sat:      99 % on Room air Temp:     97.2 degrees F oral Pulse rate:   80 / minute BP sitting:   140 / 80  (left arm)  Vitals Entered By: Vernie Murders (November 07, 2009 4:09 PM)  O2 Flow:  Room air  Physical Exam  Additional Exam:  anxious but pleasant amb bf nad  wt 167 > 160 August 19, 2009 >>170 September 02, 2009 > 168 November 08, 2009  HEENT mild turbinate edema.  Oropharynx no thrush or excess pnd or cobblestoning.  No JVD or cervical adenopathy. Mild accessory muscle hypertrophy. Trachea midline, nl thryroid. Chest was hyperinflated by percussion with diminished breath sounds and moderate increased exp time without wheeze. Hoover sign positive at mid inspiration. Regular rate and rhythm without murmur gallop or rub or increase P2 or edema.  Abd: no hsm, nl excursion. Ext warm without cyanosis or clubbing.     Impression & Recommendations:  Problem # 1:  SARCOIDOSIS (ICD-135) may have airway involvement or primary asthma and needs to follow her written action plan.  I emphasized to the patient that just like Dorothy in [employer], she is already wearing "ruby slippers" she can click anytime she wants:  the answer to all her recurrent symptoms  is already  literally at her fingertips:   all she has to do is refer to the medicine calendar we provided her  and her problems  are each addressed in a user-friendly format, reading from left to right for each symptoms she's likely to develop between office visits. .  See instructions for specific recommendations   Medications Added to Medication List This Visit: 1)  Maxair Autohaler 200 Mcg/inh Aerb (Pirbuterol acetate) .... 2 puffs every 4 hours as needed  Complete Medication List: 1)  Centrum Silver Tabs (Multiple vitamins-minerals) .... Once daily 2)  Caltrate 600 1500 Mg Tabs (Calcium carbonate)  .... 2 tabs by mouth once daily 3)  Qvar 80 Mcg/act Aers (Beclomethasone dipropionate) .... Two  puffs twice daily 4)  Prilosec Otc 20 Mg Tbec (Omeprazole magnesium) .... Take one 30-60 min before first meal of the day 5)  Mucinex Dm 30-600 Mg Xr12h-tab (Dextromethorphan-guaifenesin) .Lisa [name].. 1 -2 every 12 hours as needed for cough or congestion 6)  Drixoral Cold/allergy 6-120 Mg Xr12h-tab (Dexbrompheniramine-pseudoeph) .... As directed when needed 7)  Tramadol Hcl 50 Mg Tabs (Tramadol hcl) .Lisa [name].. 1 by mouth every 4 hours as needed for cough or severe pain 8)  Lidoderm 5 % Ptch (Lidocaine) .... Apply 1 patch daily for 12 hours as needed neck/back pain 9)  Klor-con M20 20 Meq Cr-tabs (Potassium chloride  crys cr) .... As directed when needed 10)  Maxair Autohaler 200 Mcg/inh Aerb (Pirbuterol acetate) .... 2 puffs every 4 hours as needed  Other Orders: Est. Patient Level III (16109)  Patient Instructions: 1)  See calendar for specific medication instructions and bring it back for each and every office visit for every healthcare provider you see.  Without it,  you may not receive the best quality medical care that we feel you deserve.  2)  Return to office in 3 months, sooner if needed  Prescriptions: QVAR 80 MCG/ACT  AERS (BECLOMETHASONE DIPROPIONATE) Two  puffs twice daily  #3 x 3   Entered and Authorized by:   Nyoka Cowden MD   Signed by:   Nyoka Cowden MD on 11/07/2009   Method used:   Print then Give to Patient   RxID:   [id number]

## 2010-11-26 NOTE — Assessment & Plan Note (Signed)
Summary: NP follow up - med calendar   Primary Provider/Referring Provider:  Lynnae Sandhoff   CC:  est med calendar - pt brought all meds with her today.  no new complaints..  History of Present Illness: 70 yobf  never smoker diagnosed with sarcoidosis with parenchymal and airway involvement in 1983 at [facility].  At one point she also had significant sinus involvement and had remained prednisone dependent since 1983 and stopped without flare  06/2008  August 06, 2008 ov no prednisone x 1 month(inadvertently stopped without adverse effects)  and no symptoms no am nausea or breathing no worse on servent and qvar   January 10, 2009 most of the time when she leaves off serevent can't feel the difference in terms of doe or cough.   rec dc serevent 2 days before PFT's  February 05, 2009 off serevent, using qvar 80 2 two times a day with no increase need for rescue vs while on serevent. rec leave off serevent   September 02, 2009-- follow up and med reivew Doing very well. Cough  resolved. We discussed that she should stay off bisphosphanates d/t potential for GI irritaiton . She will discuss alternative therapy with her primary  We disuccsed her medication and made a med calendar today.   November 07, 2009 2 month followup.  Pt states that she had some increased SOB a few wks ago.  She states that she relates this to the change in weather.  She states that she seems to be doing better now.  Denies any complaints today. did not think to use maxair even though it's listed clearly in action plan to do so for increased sob.   January 16, 2010 3 month followup.  Pt states no complaints today.  Breathing is doing well. no cough.   May 04, 2010 3 month followup.  Pt states that her breathing has been okay as long as she does not stay outside in the hot weather for too long.  She denies cough but c/o hoarsness x 1 wk.    rec consider off biphosphonates  May 20, 2010 Acute visit.  Pt c/o prod cough with yellow to green  sputum x 4 days.  She states "coughs all night long".  She started biaxin 500 mg bid x 4 days ago with no improvement.   c/o mouth allergies from foods, lost list of food allergies supplied by [facility] and wants f/u here.    >>steroid taper  June 03, 2010 -Presents for follow up and med review. Seen last visit w/ persistent cough tx w/ steroid taper. Says her cough is improved. Has upcoming visit with Dr. Sharyn Lull to evaluate for possible food allergies. We reveiwed her meds and updated her med calendar. She is feeling better more toward her baseline. She remains of bisphosphonates. Denies chest pain, orthopnea, hemoptysis, fever, n/v/d, edema, headache.    Medications Prior to Update: 1)  Centrum Silver   Tabs (Multiple Vitamins-Minerals) .... Once Daily 2)  Caltrate 600 1500 Mg  Tabs (Calcium Carbonate) .... 2 Tabs By Mouth Once Daily 3)  Qvar 80 Mcg/act  Aers (Beclomethasone Dipropionate) .... Two  Puffs Twice Daily 4)  Prilosec Otc 20 Mg Tbec (Omeprazole Magnesium) .... Take One 30-60 Min Before First Meal of The Day 5)  Klor-Con M20 20 Meq Cr-Tabs (Potassium Chloride Crys Cr) .... 2 Daily 6)  Vitamin D 1000 Unit Tabs (Cholecalciferol) .Marland Kitchen.. 1 Once Daily 7)  Mucinex Dm 30-600 Mg Xr12h-Tab (Dextromethorphan-Guaifenesin) .Marland Kitchen.. 1 -2 Every  12 Hours As Needed For Cough or Congestion 8)  Maxair Autohaler 200 Mcg/inh Aerb (Pirbuterol Acetate) .... 2 Puffs Every 4 Hours As Needed 9)  Alendronate Sodium 70 Mg Tabs (Alendronate Sodium) .... Try Off This 7/27 Thru 11/1 10)  Drixoral Cold/allergy 6-120 Mg Xr12h-Tab (Dexbrompheniramine-Pseudoeph) .... As Directed When Needed 11)  Tramadol Hcl 50 Mg  Tabs (Tramadol Hcl) .Marland Kitchen.. 1 By Mouth Every 4 Hours As Needed For Cough or Severe Pain 12)  Lidoderm 5 % Ptch (Lidocaine) .... Apply 1 Patch Daily For 12 Hours As Needed Neck/back Pain 13)  Prednisone 10 Mg  Tabs (Prednisone) .... 4 Each Am X 2days, 2x2days, 1x2days and Stop  Current Medications (verified): 1)   Centrum Silver   Tabs (Multiple Vitamins-Minerals) .... Once Daily 2)  Caltrate 600 1500 Mg  Tabs (Calcium Carbonate) .... 2 Tabs By Mouth Once Daily 3)  Qvar 80 Mcg/act  Aers (Beclomethasone Dipropionate) .... Two  Puffs Twice Daily 4)  Prilosec Otc 20 Mg Tbec (Omeprazole Magnesium) .... Take One 30-60 Min Before First Meal of The Day 5)  Klor-Con M20 20 Meq Cr-Tabs (Potassium Chloride Crys Cr) .Marland Kitchen.. 1 Tab  Every Morning and 1 At Bedtime 6)  Vitamin D 1000 Unit Tabs (Cholecalciferol) .Marland Kitchen.. 1 Once Daily 7)  Mucinex Dm 30-600 Mg Xr12h-Tab (Dextromethorphan-Guaifenesin) .Marland Kitchen.. 1 -2 Every 12 Hours As Needed For Cough or Congestion 8)  Maxair Autohaler 200 Mcg/inh Aerb (Pirbuterol Acetate) .... 2 Puffs Every 4 Hours As Needed  Allergies (verified): 1)  ! Asa 2)  ! Valium  Past History:  Past Medical History: Last updated: 05/20/2010 SARCOIDOSIS (ICD-135)     - On prednisone   1983 ([facility]) > off 9/09 ([place])     - PFT's 04/24/07 FEV1 1.62 (72%) ratio 63, no resp to Bronchodilators, DLC0 61% (on prednisone)     - PFT's 02/05/09 FEV1 1.59 (72%) ratio 66  no resp to bronchodilators DLC0 63 (off prednisone)     - HFA technique 100% February 05, 2009 with autohaler MVP syndrome..............................................................................................Marland Kitchen Dr. Lucas Mallow Osteopenia     - DEXA 09/08/2009  T spine 1.2,   L Fem Neck -1.6,  RFem neck -1.9 Food allergies dx at [facility]      - Referred to [facility] for food allergies May 20, 2010   Past Surgical History: Last updated: 08/19/2009 status post hysterectomy with ovaries intact Cataract surgery- left eye 08/18/09  Family History: Last updated: 06/03/2010 neg resp dz, sarcoid  rheumatism - mother cancer - brother with prostate  Social History: Last updated: 06/03/2010 never smoker widow 1 child no alcohol retired from [employer]  Risk Factors: Smoking Status: never (11/30/2007)  Family History: neg resp dz,  sarcoid  rheumatism - mother cancer - brother with prostate  Social History: never smoker widow 1 child no alcohol retired from [employer]  Review of Systems      See HPI  Vital Signs:  Patient profile:   70 year old female Height:      67 inches Weight:      157.38 pounds BMI:     24.74 O2 Sat:      97 % on Room air Pulse rate:   64 / minute BP sitting:   140 / 78  (left arm) Cuff size:   regular  Vitals Entered By: Boone Master CNA/MA (June 03, 2010 9:05 AM)  O2 Flow:  Room air CC: est med calendar - pt brought all meds with her today.  no new complaints. Is Patient Diabetic? No  Comments Medications reviewed with patient Daytime contact number verified with patient. Boone Master CNA/MA  June 03, 2010 9:05 AM    Physical Exam  Additional Exam:  anxious but pleasant amb bf nad who failed to answer a single question asked in a straightforward manner, tending to go off on tangents or answer questions with ambiguous medical terms or diagnoses and seemed  somewhat perplexed/ hesitant  when asked the same question more than once for clarification.  wt  160 August 19, 2009 >>170 September 02, 2009 > 168 November 08, 2009 > 170 January 16, 2010 > 162 May 04, 2010 > 158 May 20, 2010 >>157 06/03/10 HEENT mild turbinate edema.  Oropharynx no thrush or excess pnd or cobblestoning.  No JVD or cervical adenopathy. Mild accessory muscle hypertrophy. Trachea midline, nl thryroid. Chest was hyperinflated by percussion with diminished breath sounds and moderate increased exp time without wheeze. Hoover sign positive at mid inspiration. Regular rate and rhythm without murmur gallop or rub or increase P2 or edema.  Abd: no hsm, nl excursion. Ext warm without cyanosis or clubbing.     Impression & Recommendations:  Problem # 1:  SARCOIDOSIS (ICD-135) Flare recently w/ upper airway cough now improved w/ steroid taper.   Problem # 2:  Hx of COUGH (ICD-786.2)  Improved w/ steroids, GERD  prevention.  Meds reviewed with pt education and computerized med calendar   Orders: Est. Patient Level III (04540)  Medications Added to Medication List This Visit: 1)  Klor-con M20 20 Meq Cr-tabs (Potassium chloride crys cr) .Marland Kitchen.. 1 tab  every morning and 1 at bedtime  Complete Medication List: 1)  Centrum Silver Tabs (Multiple vitamins-minerals) .... Once daily 2)  Caltrate 600 1500 Mg Tabs (Calcium carbonate) .... 2 tabs by mouth once daily 3)  Qvar 80 Mcg/act Aers (Beclomethasone dipropionate) .... Two  puffs twice daily 4)  Prilosec Otc 20 Mg Tbec (Omeprazole magnesium) .... Take one 30-60 min before first meal of the day 5)  Klor-con M20 20 Meq Cr-tabs (Potassium chloride crys cr) .Marland Kitchen.. 1 tab  every morning and 1 at bedtime 6)  Vitamin D 1000 Unit Tabs (Cholecalciferol) .Marland Kitchen.. 1 once daily 7)  Mucinex Dm 30-600 Mg Xr12h-tab (Dextromethorphan-guaifenesin) .Marland Kitchen.. 1 -2 every 12 hours as needed for cough or congestion 8)  Maxair Autohaler 200 Mcg/inh Aerb (Pirbuterol acetate) .... 2 puffs every 4 hours as needed  Patient Instructions: 1)  Continue on same regimen.  2)  Follow med calendar closely and bring to each visit.  3)  follow up Dr. Sherene Sires in 3 months  and as needed

## 2010-11-26 NOTE — Letter (Signed)
Summary: Medical Clearance / [facility]  Medical Clearance / [facility]   Imported By: Lennie Odor 12/30/2009 14:22:01  _____________________________________________________________________  External Attachment:    Type:   Image     Comment:   External Document

## 2010-11-26 NOTE — Assessment & Plan Note (Signed)
Summary: Pulmonary/ acute ext ov   Primary Provider/Referring Provider:  Lynnae Sandhoff   CC:  Acute visit.  Pt c/o prod cough with yellow to green sputum x 4 days.  She states "coughs all night long".  She started biaxin 500 mg bid x 4 days ago with no improvement.  .  History of Present Illness: 55 yobf  never smoker diagnosed with sarcoidosis with parenchymal and airway involvement in 1983 at [facility].  At one point she also had significant sinus involvement and had remained prednisone dependent since 1983 and stopped without flare  06/2008  August 06, 2008 ov no prednisone x 1 month(inadvertently stopped without adverse effects)  and no symptoms no am nausea or breathing no worse on servent and qvar   January 10, 2009 most of the time when she leaves off serevent can't feel the difference in terms of doe or cough.   rec dc serevent 2 days before PFT's  February 05, 2009 off serevent, using qvar 80 2 two times a day with no increase need for rescue vs while on serevent. rec leave off serevent   September 02, 2009-- follow up and med reivew Doing very well. Cough  resolved. We discussed that she should stay off bisphosphanates d/t potential for GI irritaiton . She will discuss alternative therapy with her primary  We disuccsed her medication and made a med calendar today.   May 20, 2010 Acute visit.  Pt c/o prod cough with yellow to green sputum x 4 days.  She states "coughs all night long".  She started biaxin 500 mg bid x 4 days ago with no improvement.   c/o mouth allergies from foods, lost list of food allergies supplied by [facility] and wants f/u here.  Pt denies any significant sore throat, dysphagia, itching, sneezing,  nasal congestion or excess/purulent nasal secretions,  fever, chills, sweats, unintended wt loss, pleuritic or exertional cp, hempoptysis, change in activity tolerance  orthopnea pnd or leg swelling. Pt also denies any obvious fluctuation in symptoms with weather or environmental change  or other alleviating or aggravating factors.     November 07, 2009 2 month followup.  Pt states that she had some increased SOB a few wks ago.  She states that she relates this to the change in weather.  She states that she seems to be doing better now.  Denies any complaints today. did not think to use maxair even though it's listed clearly in action plan to do so for increased sob.   January 16, 2010 3 month followup.  Pt states no complaints today.  Breathing is doing well. no cough.   May 04, 2010 3 month followup.  Pt states that her breathing has been okay as long as she does not stay outside in the hot weather for too long.  She denies cough but c/o hoarsness x 1 wk.    rec consider off biphosphonates  May 20, 2010 ov   Current Medications (verified): 1)  Centrum Silver   Tabs (Multiple Vitamins-Minerals) .... Once Daily 2)  Caltrate 600 1500 Mg  Tabs (Calcium Carbonate) .... 2 Tabs By Mouth Once Daily 3)  Qvar 80 Mcg/act  Aers (Beclomethasone Dipropionate) .... Two  Puffs Twice Daily 4)  Prilosec Otc 20 Mg Tbec (Omeprazole Magnesium) .... Take One 30-60 Min Before First Meal of The Day 5)  Vitamin D 1000 Unit Tabs (Cholecalciferol) .Marland Kitchen.. 1 Once Daily 6)  Alendronate Sodium 70 Mg Tabs (Alendronate Sodium) .Marland Kitchen.. 1 Every Week 7)  Klor-Con M20 20 Meq Cr-Tabs (Potassium Chloride Crys Cr) .... 2 Daily 8)  Mucinex Dm 30-600 Mg Xr12h-Tab (Dextromethorphan-Guaifenesin) .Marland Kitchen.. 1 -2 Every 12 Hours As Needed For Cough or Congestion 9)  Drixoral Cold/allergy 6-120 Mg Xr12h-Tab (Dexbrompheniramine-Pseudoeph) .... As Directed When Needed 10)  Tramadol Hcl 50 Mg  Tabs (Tramadol Hcl) .Marland Kitchen.. 1 By Mouth Every 4 Hours As Needed For Cough or Severe Pain 11)  Lidoderm 5 % Ptch (Lidocaine) .... Apply 1 Patch Daily For 12 Hours As Needed Neck/back Pain 12)  Maxair Autohaler 200 Mcg/inh Aerb (Pirbuterol Acetate) .... 2 Puffs Every 4 Hours As Needed  Allergies (verified): 1)  ! Asa 2)  ! Valium  Past  History:  Past Medical History: SARCOIDOSIS (ICD-135)     - On prednisone   1983 ([facility]) > off 9/09 ([place])     - PFT's 04/24/07 FEV1 1.62 (72%) ratio 63, no resp to Bronchodilators, DLC0 61% (on prednisone)     - PFT's 02/05/09 FEV1 1.59 (72%) ratio 66  no resp to bronchodilators DLC0 63 (off prednisone)     - HFA technique 100% February 05, 2009 with autohaler MVP syndrome..............................................................................................Marland Kitchen Dr. Lucas Mallow Osteopenia     - DEXA 09/08/2009  T spine 1.2,   L Fem Neck -1.6,  RFem neck -1.9 Food allergies dx at [facility]      - Referred to [facility] for food allergies May 20, 2010   Vital Signs:  Patient profile:   70 year old female Weight:      158 pounds O2 Sat:      95 % on Room air Temp:     98.3 degrees F oral Pulse rate:   87 / minute BP sitting:   110 / 80  (left arm)  Vitals Entered By: Vernie Murders (May 20, 2010 11:37 AM)  O2 Flow:  Room air  Physical Exam  Additional Exam:  anxious but pleasant amb bf nad who failed to answer a single question asked in a straightforward manner, tending to go off on tangents or answer questions with ambiguous medical terms or diagnoses and seemed  somewhat perplexed/ hesitant  when asked the same question more than once for clarification.  wt  160 August 19, 2009 >>170 September 02, 2009 > 168 November 08, 2009 > 170 January 16, 2010 > 162 May 04, 2010 > 158 May 20, 2010  HEENT mild turbinate edema.  Oropharynx no thrush or excess pnd or cobblestoning. Mild mucositis changes bucchal mucosa.  No JVD or cervical adenopathy. Mild accessory muscle hypertrophy. Trachea midline, nl thryroid. Chest was hyperinflated by percussion with diminished breath sounds and moderate increased exp time without wheeze. Hoover sign positive at mid inspiration. Regular rate and rhythm without murmur gallop or rub or increase P2 or edema.  Abd: no hsm, nl excursion. Ext warm without cyanosis or clubbing.      Impression & Recommendations:  Problem # 1:  URI, ACUTE (ICD-465.9)  Her updated medication list for this problem includes:    Mucinex Dm 30-600 Mg Xr12h-tab (Dextromethorphan-guaifenesin) .Marland Kitchen... 1 -2 every 12 hours as needed for cough or congestion    Drixoral Cold/allergy 6-120 Mg Xr12h-tab (Dexbrompheniramine-pseudoeph) .Marland Kitchen... As directed when needed   Add short course prednisone (x6days) and f/u if not back to basline  Orders: Est. Patient Level IV (81191)  Problem # 2:  SARCOIDOSIS (ICD-135) fixed airflow obstruction by previous pft's likely the result of chronic airways involvment from sarcoidosis but no def flare off systemic steroids since 2009 while  maintained on qvar  - in fact many of her symptoms are more c/w   Upper airway cough syndrome, so named because it's frequently impossible to sort out how much is  CR/sinusitis with freq throat clearing (which can be related to primary GERD)   vs  causing  secondary extra esophageal GERD from wide swings in gastric pressure that occur with throat clearing, promoting self use of mint and menthol lozenges that reduce the lower esophageal sphincter tone and exacerbate the problem further. These are the same pts who not infrequently have failed to tolerate ace inhibitors,  dry powder inhalers or biphosphonates or report having reflux symptoms that don't respond to standard doses of PPI   For now stop biphosphonates and regroup after repeat med reconciliation process successfully  Problem # 3:  ALLERGY, FOOD (ICD-693.1)  Orders: Misc. Referral (Misc. Ref) Est. Patient Level IV (04540)  Was followed at [facility], requests f/u here   Each maintenance medication was reviewed in detail including most importantly the difference between maintenance and as needed and under what circumstances the prns are to be used. med reconciliation has been a major issue here.  To keep things simple, I have asked the patient to first separate medicines that are  perceived as maintenance, that is to be taken daily "no matter what", from those medicines that are taken on only on an as-needed basis and I have given the patient examples of both, and then return to see our NP to generate a  detailed  medication calendar which should be followed until the next physician sees the patient and updates it.   Once we're sure that we're all reading from the same page in terms of medication admiistration, she needs to be scheduled to follow up with me   Medications Added to Medication List This Visit: 1)  Alendronate Sodium 70 Mg Tabs (Alendronate sodium) .... Try off this 7/27 thru 11/1 2)  Prednisone 10 Mg Tabs (Prednisone) .... 4 each am x 2days, 2x2days, 1x2days and stop  Patient Instructions: 1)  Prednisone 4 each am x 2days, 2x2days, 1x2days and stop  2)  See Tammy NP w/in 2 weeks with all your medications, even over the counter meds, separated in two separate bags, the ones you take no matter what vs the ones you stop once you feel better and take only as needed.  She will generate for you a new user friendly medication calendar that will put [place] all on the same page re: your medication use.   3)  See Patient Care Coordinator before leaving for food allergy eval  Prescriptions: PREDNISONE 10 MG  TABS (PREDNISONE) 4 each am x 2days, 2x2days, 1x2days and stop  #14 x 0   Entered and Authorized by:   Nyoka Cowden MD   Signed by:   Nyoka Cowden MD on 05/20/2010   Method used:   Electronically to        [facility] # [phone]* (retail)       [address]       [place], [place]  [address]       Ph: [phone]       Fax: [phone]   RxID:   [id number]   Appended Document: Pulmonary/ acute ext ov cleared for Shoulder Surgery  Appended Document: Pulmonary/ acute ext ov called spoke with patient to inform her that MW has cleared her for shoulder surgery.  per pt this has not been scheduled yet.  will fax note and MW's  append to [facility].

## 2010-12-07 ENCOUNTER — Ambulatory Visit (INDEPENDENT_AMBULATORY_CARE_PROVIDER_SITE_OTHER)
Admission: RE | Admit: 2010-12-07 | Discharge: 2010-12-07 | Disposition: A | Discharge status: Home / Self Care | Payer: Medicare Other | Source: Ambulatory Visit | Attending: Internal Medicine | Admitting: Internal Medicine

## 2010-12-07 ENCOUNTER — Encounter: Payer: Self-pay | Admitting: Internal Medicine

## 2010-12-07 ENCOUNTER — Ambulatory Visit (INDEPENDENT_AMBULATORY_CARE_PROVIDER_SITE_OTHER): Payer: Medicare Other | Admitting: Internal Medicine

## 2010-12-07 ENCOUNTER — Other Ambulatory Visit: Payer: Self-pay | Admitting: Internal Medicine

## 2010-12-07 DIAGNOSIS — D869 Sarcoidosis, unspecified: Secondary | ICD-10-CM

## 2010-12-16 NOTE — Assessment & Plan Note (Signed)
Summary: Pulmonary/ ext ov for new sob with walking sats = ok x 3 laps   Primary Provider/Referring Provider:  Rosine Abe  CC:  DOE x 2 months.  History of Present Illness: 49 yobf  never smoker diagnosed with sarcoidosis with parenchymal and airway involvement in 1983 at [facility].  At one point she also had significant sinus involvement and had remained prednisone dependent since 1983 and stopped without flare  06/2008  August 06, 2008 ov no prednisone x 1 month(inadvertently stopped without adverse effects)  and no symptoms no am nausea or breathing no worse on servent and qvar   January 10, 2009 most of the time when she leaves off serevent can't feel the difference in terms of doe or cough.   rec dc serevent 2 days before PFT's  February 05, 2009 off serevent, using qvar 80 2 two times a day with no increase need for rescue vs while on serevent. rec leave off serevent   September 02, 2009-- follow up and med reivew Doing very well. Cough  resolved. We discussed that she should stay off bisphosphanates d/t potential for GI irritaiton . She will discuss alternative therapy with her primary  We disuccsed her medication and made a med calendar today.   November 07, 2009 2 month followup.  Pt states that she had some increased SOB a few wks ago.  She states that she relates this to the change in weather.  She states that she seems to be doing better now.  Denies any complaints today. did not think to use maxair even though it's listed clearly in action plan to do so for increased sob.   January 16, 2010 3 month followup.  Pt states no complaints today.  Breathing is doing well. no cough.   May 04, 2010 3 month followup.  Pt states that her breathing has been okay as long as she does not stay outside in the hot weather for too long.  She denies cough but c/o hoarsness x 1 wk.    rec consider off biphosphonates  May 20, 2010 Acute visit.  Pt c/o prod cough with yellow to green sputum x 4 days.  She states  "coughs all night long".  She started biaxin 500 mg bid x 4 days ago with no improvement.   c/o mouth allergies from foods, lost list of food allergies supplied by [facility] and wants f/u here.    >>steroid taper  June 03, 2010 -Presents for follow up and med review. Seen last visit w/ persistent cough tx w/ steroid taper. Says her cough is improved. Has upcoming visit with Dr. Sharyn Lull to evaluate for possible food allergies. We reveiwed her meds and updated her med calendar. She is feeling better more toward her baseline. She remains off  bisphosphonates.  rec no change rx, follow med calendar  August 25, 2010 ov cc  cough better, breathing ok though not very active.  no change in rx  December 07, 2010 ov cc sob x fast walk x sev hundred feet. cough is ok, has not tried saba to see if helps cough because finds avoiding activities that provoke it or resting afterwards does fine. no noct spells. Pt denies any significant sore throat, dysphagia, itching, sneezing,  nasal congestion or excess secretions,  fever, chills, sweats, unintended wt loss, pleuritic or exertional cp, hempoptysis, variability  in activity tolerance  orthopnea pnd or leg swelling      Current Medications (verified): 1)  Centrum Silver  Tabs (Multiple Vitamins-Minerals) .... Once Daily 2)  Caltrate 600 1500 Mg  Tabs (Calcium Carbonate) .... 2 Tabs By Mouth Once Daily 3)  Qvar 80 Mcg/act  Aers (Beclomethasone Dipropionate) .... Two  Puffs Twice Daily 4)  Prilosec Otc 20 Mg Tbec (Omeprazole Magnesium) .... Take One 30-60 Min Before First Meal of The Day 5)  Klor-Con M20 20 Meq Cr-Tabs (Potassium Chloride Crys Cr) .Marland Kitchen.. 1 Tab  Every Morning and 1 At Bedtime 6)  Vitamin D 1000 Unit Tabs (Cholecalciferol) .Marland Kitchen.. 1 Once Daily 7)  Mucinex Dm 30-600 Mg Xr12h-Tab (Dextromethorphan-Guaifenesin) .Marland Kitchen.. 1 -2 Every 12 Hours As Needed For Cough or Congestion 8)  Maxair Autohaler 200 Mcg/inh Aerb (Pirbuterol Acetate) .... 2 Puffs Every 4 Hours As  Needed  Allergies (verified): 1)  ! Asa 2)  ! Valium  Vital Signs:  Patient profile:   70 year old female Weight:      155 pounds O2 Sat:      97 % on Room air Temp:     98.4 degrees F oral Pulse rate:   83 / minute BP sitting:   122 / 68  (left arm)  Vitals Entered By: Vernie Murders (December 07, 2010 10:51 AM)  O2 Flow:  Room air  Serial Vital Signs/Assessments:  Comments: 11:14 AM Ambulatory Pulse Oximetry  Resting; HR__80___    02 Sat__98%ra___  Lap1 (185 feet)   HR__100___   02 Sat__93%ra___ Lap2 (185 feet)   HR__119___   02 Sat___95%ra__    Lap3 (185 feet)   HR__133___   02 Sat__92%ra___  _x__Test Completed without Difficulty ___Test Stopped due to:   By: Vernie Murders    Physical Exam  Additional Exam:  anxious but pleasant amb bf nad  wt  160 August 19, 2009  > 158 August 26, 2010 > 156 December 07, 2010  HEENT mild turbinate edema.  Oropharynx no thrush or excess pnd or cobblestoning.  No JVD or cervical adenopathy. Mild accessory muscle hypertrophy. Trachea midline, nl thryroid. Chest was hyperinflated by percussion with diminished breath sounds and moderate increased exp time without wheeze. Hoover sign positive at mid inspiration. Regular rate and rhythm without murmur gallop or rub or increase P2 or edema.  Abd: no hsm, nl excursion. Ext warm without cyanosis or clubbing.     Impression & Recommendations:  Problem # 1:  SARCOIDOSIS (ICD-135) The goal with a chronic steroid dependent illness is always arriving at the lowest effective dose that controls the disease/symptoms and not accepting a set "formula" which is based on statistics that don't take into accound individual variability or the natural hx of the dz in every individual patient, which may well vary over time. still no evidence of active dz, keep off prednisone.  I spent extra time with the patient today explaining optimal mdi  technique.  This improved from  75-90% p coaching   Each  maintenance medication was reviewed in detail including most importantly the difference between maintenance and as needed and under what circumstances the prns are to be used.  To keep things simple, I have asked the patient to first separate medicines that are perceived as maintenance, that is to be taken daily "no matter what", from those medicines that are taken on only on an as-needed basis and I have given the patient examples of both, and then return to see our NP to generate a  detailed  medication calendar which should be followed until the next physician sees the patient and updates it.  Once we're sure that we're all reading from the same page in terms of medication admiistration, she needs to be scheduled to follow up with me with pft's if not better.  Medications Added to Medication List This Visit: 1)  Qvar 80 Mcg/act Aers (Beclomethasone dipropionate) .... 2 puffs first thing  in am and 2 puffs again in pm about 12 hours later 2)  Klor-con M20 20 Meq Cr-tabs (Potassium chloride crys cr) .Marland Kitchen.. 1 twice daily with meals 3)  Maxair Autohaler 200 Mcg/inh Aerb (Pirbuterol acetate) .... 2 puffs every 4 hours as needed  Other Orders: T-2 View CXR (71020TC) Est. Patient Level III (98119) Pulse Oximetry, Ambulatory ([address])  Patient Instructions: 1)  Try using maxair before desired activity to see if makes your breathing easier 2)  See Tammy NP w/in 4 weeks with all your medications, even over the counter meds, separated in two separate bags, the ones you take no matter what vs the ones you stop once you feel better and take only as needed.  She will generate for you a new user friendly medication calendar that will put [place] all on the same page re: your medication use.  Prescriptions: QVAR 80 MCG/ACT  AERS (BECLOMETHASONE DIPROPIONATE) 2 puffs first thing  in am and 2 puffs again in pm about 12 hours later  #1 x 11   Entered and Authorized by:   Nyoka Cowden MD   Signed by:   Nyoka Cowden MD  on 12/07/2010   Method used:   Print then Give to Patient   RxID:   [id number] MAXAIR AUTOHALER 200 MCG/INH AERB (PIRBUTEROL ACETATE) 2 puffs every 4 hours as needed  #1 x 11   Entered and Authorized by:   Nyoka Cowden MD   Signed by:   Nyoka Cowden MD on 12/07/2010   Method used:   Print then Give to Patient   RxID:   [id number]

## 2011-01-04 ENCOUNTER — Encounter (INDEPENDENT_AMBULATORY_CARE_PROVIDER_SITE_OTHER): Payer: Medicare Other | Admitting: Adult Health

## 2011-01-04 ENCOUNTER — Encounter: Payer: Self-pay | Admitting: Adult Health

## 2011-01-04 DIAGNOSIS — D869 Sarcoidosis, unspecified: Secondary | ICD-10-CM

## 2011-01-05 LAB — URINALYSIS, ROUTINE W REFLEX MICROSCOPIC
Bilirubin Urine: NEGATIVE
Glucose, UA: NEGATIVE mg/dL
Hgb urine dipstick: NEGATIVE
Ketones, ur: NEGATIVE mg/dL
Nitrite: NEGATIVE
Protein, ur: NEGATIVE mg/dL
Specific Gravity, Urine: 1.019 (ref 1.005–1.030)
Urobilinogen, UA: 0.2 mg/dL (ref 0.0–1.0)
pH: 7.5 (ref 5.0–8.0)

## 2011-01-05 LAB — POCT I-STAT, CHEM 8
BUN: 21 mg/dL (ref 6–23)
Calcium, Ion: 1.02 mmol/L — ABNORMAL LOW (ref 1.12–1.32)
Chloride: 104 mEq/L (ref 96–112)
Creatinine, Ser: 0.8 mg/dL (ref 0.4–1.2)
Glucose, Bld: 82 mg/dL (ref 70–99)
HCT: 41 % (ref 36.0–46.0)
Hemoglobin: 13.9 g/dL (ref 12.0–15.0)
Potassium: 5 mEq/L (ref 3.5–5.1)
Sodium: 137 mEq/L (ref 135–145)
TCO2: 30 mmol/L (ref 0–100)

## 2011-01-05 LAB — CBC
HCT: 37.3 % (ref 36.0–46.0)
Hemoglobin: 12.6 g/dL (ref 12.0–15.0)
MCH: 31.9 pg (ref 26.0–34.0)
MCHC: 33.8 g/dL (ref 30.0–36.0)
MCV: 94.6 fL (ref 78.0–100.0)
Platelets: 321 10*3/uL (ref 150–400)
RBC: 3.94 MIL/uL (ref 3.87–5.11)
RDW: 13.9 % (ref 11.5–15.5)
WBC: 6.6 10*3/uL (ref 4.0–10.5)

## 2011-01-05 LAB — DIFFERENTIAL
Basophils Absolute: 0 10*3/uL (ref 0.0–0.1)
Basophils Relative: 1 % (ref 0–1)
Eosinophils Absolute: 0 10*3/uL (ref 0.0–0.7)
Eosinophils Relative: 0 % (ref 0–5)
Lymphocytes Relative: 16 % (ref 12–46)
Lymphs Abs: 1 10*3/uL (ref 0.7–4.0)
Monocytes Absolute: 0.6 10*3/uL (ref 0.1–1.0)
Monocytes Relative: 9 % (ref 3–12)
Neutro Abs: 4.9 10*3/uL (ref 1.7–7.7)
Neutrophils Relative %: 74 % (ref 43–77)

## 2011-01-09 LAB — URINALYSIS, ROUTINE W REFLEX MICROSCOPIC
Glucose, UA: NEGATIVE mg/dL
Hgb urine dipstick: NEGATIVE
Ketones, ur: NEGATIVE mg/dL
Protein, ur: NEGATIVE mg/dL
Protein, ur: NEGATIVE mg/dL
Urobilinogen, UA: 0.2 mg/dL (ref 0.0–1.0)
Urobilinogen, UA: 0.2 mg/dL (ref 0.0–1.0)

## 2011-01-09 LAB — BASIC METABOLIC PANEL
BUN: 6 mg/dL (ref 6–23)
BUN: 9 mg/dL (ref 6–23)
CO2: 27 mEq/L (ref 19–32)
CO2: 30 mEq/L (ref 19–32)
Calcium: 9.1 mg/dL (ref 8.4–10.5)
Calcium: 9.4 mg/dL (ref 8.4–10.5)
Chloride: 101 mEq/L (ref 96–112)
Creatinine, Ser: 0.64 mg/dL (ref 0.4–1.2)
Creatinine, Ser: 0.65 mg/dL (ref 0.4–1.2)
GFR calc Af Amer: >60 mL/min (ref >60)
GFR calc non Af Amer: >60 mL/min (ref >60)
Glucose, Bld: 105 mg/dL — ABNORMAL HIGH (ref 70–99)
Glucose, Bld: 112 mg/dL — ABNORMAL HIGH (ref 70–99)
Potassium: 3.5 mEq/L (ref 3.5–5.1)
Sodium: 137 mEq/L (ref 135–145)

## 2011-01-09 LAB — URINE MICROSCOPIC-ADD ON

## 2011-01-09 LAB — CBC
HCT: 38.9 % (ref 36.0–46.0)
Hemoglobin: 13.2 g/dL (ref 12.0–15.0)
MCH: 32 pg (ref 26.0–34.0)
MCH: 32.2 pg (ref 26.0–34.0)
MCHC: 34 g/dL (ref 30.0–36.0)
MCHC: 34 g/dL (ref 30.0–36.0)
MCV: 94.3 fL (ref 78.0–100.0)
Platelets: 396 10*3/uL (ref 150–400)
Platelets: 459 10*3/uL — ABNORMAL HIGH (ref 150–400)
RBC: 4.13 MIL/uL (ref 3.87–5.11)
RDW: 13.9 % (ref 11.5–15.5)
WBC: 10.3 10*3/uL (ref 4.0–10.5)

## 2011-01-09 LAB — DIFFERENTIAL
Basophils Relative: 0 % (ref 0–1)
Eosinophils Absolute: 0 10*3/uL (ref 0.0–0.7)
Eosinophils Absolute: 0 10*3/uL (ref 0.0–0.7)
Lymphs Abs: 0.8 10*3/uL (ref 0.7–4.0)
Neutrophils Relative %: 81 % — ABNORMAL HIGH (ref 43–77)
Neutrophils Relative %: 82 % — ABNORMAL HIGH (ref 43–77)

## 2011-01-12 NOTE — Assessment & Plan Note (Signed)
Summary: NP follow up - med calendar    Vital Signs:  Patient profile:   70 year old female Height:      67 inches Weight:      154.25 pounds BMI:     24.25 O2 Sat:      96 % on Room air Temp:     99.8 degrees F oral Pulse rate:   74 / minute BP sitting:   122 / 70  (right arm) Cuff size:   regular  Vitals Entered By: Boone Master CNA/MA (January 04, 2011 9:02 AM)  O2 Flow:  Room air  Primary Provider/Referring Provider:  Rosine Abe  CC:  est med calendar - pt brought all meds with her today.  no new complaints..  History of Present Illness: 67 yobf  never smoker diagnosed with sarcoidosis with parenchymal and airway involvement in 1983 at [facility].  At one point she also had significant sinus involvement and had remained prednisone dependent since 1983 and stopped without flare  06/2008  August 06, 2008 ov no prednisone x 1 month(inadvertently stopped without adverse effects)  and no symptoms no am nausea or breathing no worse on servent and qvar   January 10, 2009 most of the time when she leaves off serevent can't feel the difference in terms of doe or cough.   rec dc serevent 2 days before PFT's  February 05, 2009 off serevent, using qvar 80 2 two times a day with no increase need for rescue vs while on serevent. rec leave off serevent   September 02, 2009-- follow up and med reivew Doing very well. Cough  resolved. We discussed that she should stay off bisphosphanates d/t potential for GI irritaiton . She will discuss alternative therapy with her primary  We disuccsed her medication and made a med calendar today.   November 07, 2009 2 month followup.  Pt states that she had some increased SOB a few wks ago.  She states that she relates this to the change in weather.  She states that she seems to be doing better now.  Denies any complaints today. did not think to use maxair even though it's listed clearly in action plan to do so for increased sob.   January 16, 2010 3 month followup.  Pt  states no complaints today.  Breathing is doing well. no cough.   May 04, 2010 3 month followup.  Pt states that her breathing has been okay as long as she does not stay outside in the hot weather for too long.  She denies cough but c/o hoarsness x 1 wk.    rec consider off biphosphonates  May 20, 2010 Acute visit.  Pt c/o prod cough with yellow to green sputum x 4 days.  She states "coughs all night long".  She started biaxin 500 mg bid x 4 days ago with no improvement.   c/o mouth allergies from foods, lost list of food allergies supplied by [facility] and wants f/u here.    >>steroid taper  June 03, 2010 -Presents for follow up and med review. Seen last visit w/ persistent cough tx w/ steroid taper. Says her cough is improved. Has upcoming visit with Dr. Sharyn Lull to evaluate for possible food allergies. We reveiwed her meds and updated her med calendar. She is feeling better more toward her baseline. She remains off  bisphosphonates.  rec no change rx, follow med calendar  August 25, 2010 ov cc  cough better, breathing ok though not  very active.  no change in rx   December 07, 2010 ov cc sob x fast walk x sev hundred feet. cough is ok, has not tried saba to see if helps cough because finds avoiding activities that provoke it or resting afterwards does fine. no noct spells. >>cxr -chronic changes  January 04, 2011 --Returns for follow up and med review. Doing well. Does have some DOE , did not try Maxair as recommended last ov, but does not feel this is a problem. Eating healthy. Does not feel she needs Prilosec. Denies gerd, or cough. Updated her med calendar. Denies chest pain, orthopnea, hemoptysis, fever, n/v/d, edema, headache.   CC: est med calendar - pt brought all meds with her today.  no new complaints. Is Patient Diabetic? No Comments Medications reviewed with patient Daytime contact number verified with patient. Boone Master CNA/MA  January 04, 2011 9:02 AM    Medications Prior to  Update: 1)  Centrum Silver   Tabs (Multiple Vitamins-Minerals) .... Once Daily 2)  Caltrate 600 1500 Mg  Tabs (Calcium Carbonate) .... 2 Tabs By Mouth Once Daily 3)  Qvar 80 Mcg/act  Aers (Beclomethasone Dipropionate) .... 2 Puffs First Thing  in Am and 2 Puffs Again in Pm About 12 Hours Later 4)  Prilosec Otc 20 Mg Tbec (Omeprazole Magnesium) .... Take One 30-60 Min Before First Meal of The Day 5)  Klor-Con M20 20 Meq Cr-Tabs (Potassium Chloride Crys Cr) .Marland Kitchen.. 1 Twice Daily With Meals 6)  Vitamin D 1000 Unit Tabs (Cholecalciferol) .Marland Kitchen.. 1 Once Daily 7)  Mucinex Dm 30-600 Mg Xr12h-Tab (Dextromethorphan-Guaifenesin) .Marland Kitchen.. 1 -2 Every 12 Hours As Needed For Cough or Congestion 8)  Maxair Autohaler 200 Mcg/inh Aerb (Pirbuterol Acetate) .... 2 Puffs Every 4 Hours As Needed  Allergies (verified): 1)  ! Asa 2)  ! Valium  Patient Instructions: 1)  Follow med calendar closely and bring to each visit.  2)  follow up Dr. Sherene Sires in 3-4 months and as needed    Review of Systems      See HPI   Physical Exam  Additional Exam:  anxious but pleasant amb bf nad  wt  160 August 19, 2009  > 158 August 26, 2010 > 156 December 07, 2010 >154 January 04, 2011  HEENT mild turbinate edema.  Oropharynx no thrush or excess pnd or cobblestoning.  No JVD or cervical adenopathy. Mild accessory muscle hypertrophy. Trachea midline, nl thryroid. Chest was hyperinflated by percussion with diminished breath sounds and moderate increased exp time without wheeze. . Regular rate and rhythm without murmur gallop or rub or increase P2 or edema.  Abd: no hsm, nl excursion. Ext warm without cyanosis or clubbing.     Impression & Recommendations:  Problem # 1:  SARCOIDOSIS (ICD-135) no active issues.  cxr last ov with chronic changes.  preserved lung fxn on last spirometry.  reamins off steroids.  Meds reviewed with pt education and computerized med calendar completed/adjusted.  no cough or gerd, d/c prilosec    follow up  3-4 months and as needed   Other Orders: Est. Patient Level III (60454)   Past History:  Past Surgical History: Last updated: 08/19/2009 status post hysterectomy with ovaries intact Cataract surgery- left eye 08/18/09  Family History: Last updated: 06/03/2010 neg resp dz, sarcoid  rheumatism - mother cancer - brother with prostate  Social History: Last updated: 06/03/2010 never smoker widow 1 child no alcohol retired from [employer]  Risk Factors: Smoking Status: never (11/30/2007)  Past Medical History: SARCOIDOSIS (ICD-135)     - On prednisone   1983 ([facility]) > off 9/09 ([facility])     - PFT's 04/24/07 FEV1 1.62 (72%) ratio 63, no resp to Bronchodilators, DLC0 61% (on prednisone)     - PFT's 02/05/09 FEV1 1.59 (72%) ratio 66  no resp to bronchodilators DLC0 63 (off prednisone)     - HFA technique 100% February 05, 2009 with autohaler MVP syndrome...............................................................................................Marland Kitchen Dr. Lucas Mallow Osteopenia..     - DEXA 09/08/2009  T spine 1.2,   L Fem Neck -1.6,  RFem neck -1.9 Food allergies dx at [facility]      - Referred to [facility] for food allergies May 20, 2010  COMPLEX MED REGIMEN --Meds reviewed with pt education and computerized med calendar adjusted January 04, 2011    Orders Added: 1)  Est. Patient Level III [16109]

## 2011-01-18 LAB — CBC
MCV: 95.4 fL (ref 78.0–100.0)
Platelets: 303 10*3/uL (ref 150–400)
RDW: 13.8 % (ref 11.5–15.5)
WBC: 6.8 10*3/uL (ref 4.0–10.5)

## 2011-01-29 LAB — CBC
Hemoglobin: 12 g/dL (ref 12.0–15.0)
MCHC: 33.2 g/dL (ref 30.0–36.0)
MCHC: 33.5 g/dL (ref 30.0–36.0)
Platelets: 315 10*3/uL (ref 150–400)
Platelets: 355 10*3/uL (ref 150–400)
RBC: 3.77 MIL/uL — ABNORMAL LOW (ref 3.87–5.11)
RBC: 3.85 MIL/uL — ABNORMAL LOW (ref 3.87–5.11)
RDW: 13.5 % (ref 11.5–15.5)
RDW: 13.6 % (ref 11.5–15.5)
WBC: 10.1 10*3/uL (ref 4.0–10.5)

## 2011-01-29 LAB — COMPREHENSIVE METABOLIC PANEL
ALT: 15 U/L (ref 0–35)
AST: 15 U/L (ref 0–37)
Alkaline Phosphatase: 67 U/L (ref 39–117)
CO2: 30 mEq/L (ref 19–32)
Calcium: 8.1 mg/dL — ABNORMAL LOW (ref 8.4–10.5)
GFR calc Af Amer: >60 mL/min (ref >60)
Glucose, Bld: 93 mg/dL (ref 70–99)
Potassium: 3.9 mEq/L (ref 3.5–5.1)
Sodium: 140 mEq/L (ref 135–145)
Total Protein: 6.3 g/dL (ref 6.0–8.3)

## 2011-01-29 LAB — CORTISOL: Cortisol, Plasma: 14.2 ug/dL

## 2011-01-29 LAB — BASIC METABOLIC PANEL
BUN: 15 mg/dL (ref 6–23)
BUN: 9 mg/dL (ref 6–23)
CO2: 34 mEq/L — ABNORMAL HIGH (ref 19–32)
Calcium: 8.7 mg/dL (ref 8.4–10.5)
Calcium: 9.4 mg/dL (ref 8.4–10.5)
Creatinine, Ser: 0.65 mg/dL (ref 0.4–1.2)
Creatinine, Ser: 0.78 mg/dL (ref 0.4–1.2)
GFR calc Af Amer: >60 mL/min (ref >60)
GFR calc non Af Amer: >60 mL/min (ref >60)
GFR calc non Af Amer: >60 mL/min (ref >60)
Glucose, Bld: 114 mg/dL — ABNORMAL HIGH (ref 70–99)
Sodium: 140 mEq/L (ref 135–145)

## 2011-01-29 LAB — CK TOTAL AND CKMB (NOT AT ARMC)
CK, MB: 1.4 ng/mL (ref 0.3–4.0)
Relative Index: INVALID (ref 0.0–2.5)

## 2011-01-29 LAB — CARDIAC PANEL(CRET KIN+CKTOT+MB+TROPI)
CK, MB: 1.2 ng/mL (ref 0.3–4.0)
CK, MB: 1.3 ng/mL (ref 0.3–4.0)
Relative Index: INVALID (ref 0.0–2.5)
Total CK: 69 U/L (ref 7–177)
Total CK: 70 U/L (ref 7–177)

## 2011-01-29 LAB — DIFFERENTIAL
Basophils Absolute: 0 10*3/uL (ref 0.0–0.1)
Basophils Relative: 0 % (ref 0–1)
Lymphocytes Relative: 11 % — ABNORMAL LOW (ref 12–46)
Neutro Abs: 6.8 10*3/uL (ref 1.7–7.7)
Neutrophils Relative %: 80 % — ABNORMAL HIGH (ref 43–77)

## 2011-01-29 LAB — URINALYSIS, ROUTINE W REFLEX MICROSCOPIC
Bilirubin Urine: NEGATIVE
Nitrite: NEGATIVE
Protein, ur: NEGATIVE mg/dL
Specific Gravity, Urine: 1.013 (ref 1.005–1.030)
Urobilinogen, UA: 0.2 mg/dL (ref 0.0–1.0)

## 2011-01-29 LAB — TROPONIN I: Troponin I: <0.01 ng/mL (ref 0.00–0.06)

## 2011-01-29 LAB — PREALBUMIN: Prealbumin: 14.4 mg/dL — ABNORMAL LOW (ref 18.0–45.0)

## 2011-01-29 LAB — FOLATE: Folate: 14.8 ng/mL

## 2011-01-29 LAB — GLUCOSE, CAPILLARY: Glucose-Capillary: 133 mg/dL — ABNORMAL HIGH (ref 70–99)

## 2011-01-29 LAB — MAGNESIUM: Magnesium: 1.8 mg/dL (ref 1.5–2.5)

## 2011-03-09 NOTE — Assessment & Plan Note (Signed)
[facility]                             PULMONARY OFFICE NOTE   NAME:Lisa [name], Lisa [name]                       MRN:          [id number]  DATE:04/24/2007                            DOB:          [birth year]    HISTORY:  A 70 year old black female with sarcoid dating back to 1983  with parenchymal and airway involvement and prednisone dependent  chronically since her original diagnoses. She has tapered prednisone  down to 5 mg tablets one alternating with one half daily. She has used  an usual cycle of antibiotics for frequent colds, purulent sputum, and  chest congestion for which she uses Ketek cycles and has done so  without any adverse affect to date. She denies any fevers, chills,  sweats, unintended weight loss, orthopnea, PND, or leg swelling,  myalgias, arthralgias, or ocular complaints. I reviewed all her  medications with her in detail in the column dated April 24, 2007,  correct as listed.   On physical examination, she is a pleasant ambulatory black female who  does not appear cushingoid. She is afebrile, stable vital signs.  HEENT: Unremarkable. Pharynx clear.  LUNG FIELDS: Reveal slightly diminished breath sounds but no wheezing.  There is a regular rate and rhythm without murmur, gallop, or rub.  ABDOMEN: Soft, benign.  EXTREMITIES: Warm without calf tenderness, cyanosis, clubbing, or edema.   PFTs were performed and compared to 6 months ago and are unchanged with  evidence of mild air flow obstruction.   IMPRESSION:  Sarcoid with parenchymal and mild air flow obstruction  without reversibility.   Sarcoid with parenchymal and airway involvement now maintained on Qvar  80 two puffs b.i.d. plus Serevent discus one puff b.i.d. with excellent  control, and at this level should be able to taper down to 2.5 mg a day  which is near physiologic. She is very anxious about reducing the  prednisone to this level but I believe given the excellent  control of  her symptoms with Qvar and penetration to the target tissue, she should  be able to tolerate this fine.   Follow up can be every 3 months, sooner if p.r.n.     Charlaine Dalton. Sherene Sires, MD, [facility]  Electronically Signed    MBW/MedQ  DD: 04/24/2007  DT: 04/24/2007  Job #: [id number]   cc:   Brooke Bonito, M.D.

## 2011-03-09 NOTE — Assessment & Plan Note (Signed)
[facility]                             PULMONARY OFFICE NOTE   NAME:Lisa [name], Lisa [name]                       MRN:          [id number]  DATE:07/21/2007                            DOB:          [birth year]    PULMONARY/FOLLOWUP OFFICE VISIT   HISTORY:  A 70 year old black female with long-standing steroid-  dependent sarcoid with apparent airway involvement maintained now at 5  mg per day with occasional exacerbations of purulent tracheobronchitis,  for which she has previously been on Ketek, but now takes Biaxin.  She  states she has good control of her symptoms on a maintenance program,  which includes 5 mg prednisone per day and Serevent 1 puff b.i.d., as  well as Qvar 80 two puffs b.i.d.  She denies any ocular or articular  complaints, fevers, chills, sweats, excessive coughing, or rash.   For full inventory of medications, please see face sheet dated July 21, 2007.   PHYSICAL EXAMINATION:  She is a thin, ambulatory black female in no  acute distress.  VITAL SIGNS:  Stable vital signs.  HEENT:  Unremarkable.  Oropharynx is clear.  LUNGS:  The lung fields reveal diminished breath sounds bilaterally  without wheezing.  CARDIAC:  Regular rate and rhythm without murmur, gallop, or rub.  ABDOMEN:  Soft and benign.  EXTREMITIES:  Warm without calf tenderness, cyanosis, clubbing, or  edema.   IMPRESSION:  Steroid-dependent sarcoid with airway and parenchymal  involvement in the past.  Not actually sure we are still treating  active sarcoid here, but since she has been on prednisone since 1983,  she most likely has enough adrenal insufficiency to warrant chronic  steroid therapy for the rest of her life anyway.   I have, therefore, recommended no change in therapy.  Did review her  regimen with her in writing, and recommended a flu shot today, which she  received.  Followup will be every 3 months, sooner if needed.     Charlaine Dalton. Sherene Sires, MD,  [facility]  Electronically Signed    MBW/MedQ  DD: 07/21/2007  DT: 07/21/2007  Job #: [id number]   cc:   Brooke Bonito, M.D.

## 2011-03-09 NOTE — Assessment & Plan Note (Signed)
[facility]                             PULMONARY OFFICE NOTE   NAME:[name], [name]                       MRN:          [id number]  DATE:05/15/2007                            DOB:          Jun 09, 1941    HISTORY OF PRESENT ILLNESS:  This is a very complicated 70 year old  black female who has been chronically steroid dependent for sarcoid  since the 1980s but with minimum evidence of activity when I saw her on  June 30 and recommended she try 2.5 mg daily.  Within two weeks, she  noticed increasing dyspnea with activity, associated with sweating,  especially if she does not walk in cool weather.  Three days ago, she  noticed acute worsening cough with yellow sputum production and as  instructed has started Ketek cycle which has been effective in the past  and well tolerated.  Presently, she denies any nausea, pleuritic pain,  purulent sputum, fever, chills, sweats, articular complaints.  She  denies any nocturnal symptoms.   MEDICATIONS:  For full inventory of medications, please see column dated  May 15, 2007.   PHYSICAL EXAMINATION:  GENERAL:  She is an anxious but ambulatory black  female in no acute distress.  VITAL SIGNS:  Stable.  HEENT:  Unremarkable.  Pharynx is clear.  LUNGS:  Lung fields revealed diminished breath sounds,  but there is no  wheezing.  HEART:  Regular rhythm without murmurs, gallops, rubs.  No increase in  P2.  ABDOMEN:  Soft, benign. without palpable organomegaly, masses or  tenderness.  EXTREMITIES:  Warm without calf tenderness, cyanosis, clubbing or edema.   STUDIES:  Room air saturation 98% on room air.   IMPRESSION:  1. Worsening dyspnea associated over the last several days with      purulent tracheal bronchitis, already addressed with Ketek.  There      are several concerns here.  One is that the patient has somewhat of      a psychological dependency on prednisone and this may be causing      her to be more  anxious as the does is reduced which in term makes      her short of breath with activity.  I had hoped that by giving      prednisone in the form of Qvar 80 two puffs b.i.d., that we would      be able to taper the systemic dose to more of a physiologic dose.      However, at this point, having been on the medication for so long,      there is probably not much difference between the 2.5 and 5.  So, I      am going to recommend she resume the 5 mg daily.  2. I note also that some of these symptoms have occurred since she      eliminated Zegerid from her regimen.  Since her symptoms are so      atypical, they very well could represent occult reflux and reviewed      with her a diet with the option to  add Prilosec back one daily,      taken 30 minutes before breakfast (over-the-counter) if her      symptoms do not improve on the prednisone daily.  3. Finally, she appears to be tolerating the Ketek well, and she uses      cycles of this for purulent tracheal bronchitis effectively, so I      am going to ask her to continue this.   Extended discussion with this patient lasted 15-25 minutes.     Charlaine Dalton. Sherene Sires, MD, [facility]  Electronically Signed    MBW/MedQ  DD: 05/15/2007  DT: 05/15/2007  Job #: [id number]   cc:   Brooke Bonito, M.D.

## 2011-03-12 NOTE — Discharge Summary (Signed)
[facility]  Patient:    Lisa [name], Lisa [name]                       MRN: [id number] Adm. Date:  [id number] Disc. Date: [id number] Attending:  Avie Echevaria CC:         Bernadene Person, M.D.                           Discharge Summary  FINAL DIAGNOSES: 1. Acute tracheobronchitis with possible right lower lobe pneumonia in the    setting of sinusitis, acute and chronic. 2. Steroid-dependent sarcoid, with both airway and parenchymal involvement. 3. Osteoporosis.  HISTORY:  Please see dictated H&P.  This is a very nice 70 year old black female, never smoker, who has been steroid dependent for many year, initially followed at [facility] and most recently by me in the office with course complicated by osteoporosis.  She had worsened for two weeks prior to admission with worsening sinus congestion and over the last 48 hours prior to admission, had low-grade fever.  HOSPITAL COURSE:  On admission by Dr. Barbaraann Share, she had evidence of possible early right lower lobe pneumonia.  Workup revealed evidence of right maxillary sinusitis and she proved to have refractory wheezing which required high-dose IV steroids.  At the time of discharge, however, she has now been on five days of Levaquin and has no purulent sputum, much improved cough mechanics with a flutter valve and is ambulating on the floor without dyspnea.  She has not required oxygen, has maintained saturations throughout the hospitalization and no fever documented throughout the hospitalization.  She is therefore felt to be in improved condition for hospital discharge on the following medications:  DISCHARGE MEDICATIONS: 1. Premarin 1.25 mg q.d. 2. Prilosec 40 mg b.i.d. before meals. 3. Serevent 1 and Pulmicort 4 twice daily (or Advair 500/50 mcg, which ever    she has available one twice daily). 4. Guaifenesin DM two b.i.d. 5. Afrin 12-hour nasal spray for five more days, followed by Nasarel two  puffs    b.i.d.  She should stop the Afrin after five days and continue the Nasarel    two puffs twice daily. 6. Levaquin 500 mg per day for another five days, to complete 10 days of    therapy. 7. Fosamax 70 mg weekly. 8. Caltrate one b.i.d. 9. Prednisone 40 mg per day, to be tapered down to 10 mg per day by the time    she returns to my office in seven days.  DIET:  Her diet is to be regular.  ACTIVITY:  Activity is to be increased as tolerated. DD:  03/18/00 TD:  03/21/00 Job: [id number]

## 2011-03-12 NOTE — H&P (Signed)
[facility]  Patient:    Lisa [name], Lisa [name]                       MRN: [id number] Adm. Date:  [id number] Attending:  Avie Echevaria CC:         Charlaine Dalton. Sherene Sires, M.D. [facility]                         History and Physical  HISTORY OF PRESENT ILLNESS:  The patient is a 70 year old black female with sarcoidosis and asthma, who is followed by Dr. Sherene Sires, who presents with a two to three week history of increasing congestion, purulent mucus now for the last two weeks, and also increasing shortness of breath.  She has also had sharp chest pain associated with coughing.  She was apparently seen last week in the office by Dr. Sherene Sires, and her asthma medicine was changed to _____. Despite this, she has had worsening pulmonary symptoms and presented to the emergency room, where she continued to have dyspnea and a lot of congestion despite multiple nebulizer treatments as well as Solu-Medrol.  Of note, the patient was on 5 mg of prednisone a day chronically.  PAST MEDICAL HISTORY:  Sarcoidosis, history of asthma.  She denies any hypertension, diabetes, or heart disease.  ALLERGIES:  ASPIRIN, VALIUM.  MEDICATIONS:  _____ of unknown strength one puff twice a day, Fosamax q.d., prednisone 5 mg q.d., Maxzide of unknown dose q.d., Premarin of unknown dose per day, Humibid LA two p.o. b.i.d., multiple vitamins of unknown dose.  SOCIAL HISTORY:  She is accompanied by a family member tonight.  She has no history of tobacco use.  She does not drink alcohol.  She does not work outside the home currently.  She did, however, work for [employer] for 32 years.  FAMILY HISTORY:  Noncontributory.  REVIEW OF SYSTEMS:  As per history of present illness.  PHYSICAL EXAMINATION:  GENERAL:  She is a well-developed black female who clearly feels ill but is in no respiratory distress.  She is congested with a prominent cough and rattling.  VITAL SIGNS:  Blood pressure is 120/65,  pulse 111, respiratory rate 28, temperature 99.3.  HEENT:  Pupils equal, round, reactive to light and accommodation, extraocular muscles were intact.  Ears had no discharge.  Oropharynx was clear.  NECK:  Supple without JVD or lymphadenopathy.  There is no palpable thyromegaly.  CHEST:  Diffuse crackles and rhonchi throughout but no true wheezes.  There was very prominent pseudo-wheezing heard over her laryngeal area.  CARDIAC:  Tachycardic but regular rhythm with no murmurs, rubs, or gallops.  ABDOMEN:  Soft, nontender, with good bowel sounds.  _____/BREAST EXAM:  Not done, not indicated.  EXTREMITIES:  Lower extremities revealed trace edema with good pulses distally.  There is no calf tenderness.  NEUROLOGIC:  She is alert and oriented x 3 with no obvious motor deficits.  LABORATORY DATA:  Chest x-ray shows probable chronic scarring throughout both lung fields with apical volume loss and cavitation.  I would wonder whether she may have a right lower lobe infiltrate, but again it is very difficult to tell with no films for comparison.  EKG shows a normal sinus rhythm with potentially very subtle changes inferiorly and laterally.  Her CBC does show a white count of 31,000 with a hemoglobin of 12.4 and platelet count 328,000. Her electrolytes were pending at the time of dictation.  IMPRESSION:  Probable pneumonia versus tracheobronchitis superimposed over asthma and sarcoidosis.  Very difficult to tell from the chest x-ray with no comparison whether she has bronchial pneumonia or not.  Certainly she does have low-grade fever, an elevated white blood cell count, and purulent mucus with increasing congestion and respiratory distress.  I think at this point in time because of her multiple pulmonary conditions and the fact that she failed ER treatment, that she should be admitted for further care.  The patient is agreeable to this.  PLAN: 1. Admit for IV antibiotics,  bronchodilators, and steroids. 2. Follow up cultures and various laboratory tests. 3. Will notify Dr. Sherene Sires of the patients admission on Monday morning. DD:  03/13/00 TD:  03/14/00 Job: [id number]

## 2011-03-12 NOTE — Procedures (Signed)
[facility]  Patient:    Lisa [name], Lisa [name] Visit Number: [id number] MRN: [id number]          Service Type: END Location: ENDO Attending Physician:  Sabino Gasser Dictated by:   Sabino Gasser, M.D. Proc. Date: 01/04/02 Admit Date:  01/04/2002                             Procedure Report  PROCEDURE:  Colonoscopy.  ANESTHESIA:  Demerol 20, Versed 2 mg.  DESCRIPTION OF PROCEDURE:  With the patient mildly sedated in the left lateral decubitus position, the Olympus videoscopic colonoscope was inserted into the rectum and passed under direct vision to the cecum, identified by the ileocecal valve and appendiceal orifice.  The cecum unable to be totally cleared because of vegetable material.  We did roll the patient to view as much of the cecum as possible, but no gross lesions were seen.  From this point, the colonoscope was slowly withdrawn, taking circumferential views of the entire colonic mucosa visualized, until we reached the rectum which appeared normal on direct and retroflexed view.  The colonoscope was straightened and withdrawn.  The patients vital signs and pulse oximeter remained stable.  The patient tolerated the procedure well without apparent complications.  FINDINGS:  Essentially negative colonoscopic examination to the cecum.  Two grams of ampicillin, 80 mg of gentamicin were also given for both procedures.  PLAN:  I will have patient follow up with me as an outpatient to determine if further if necessary evaluation. Dictated by:   Sabino Gasser, M.D. Attending Physician:  Sabino Gasser DD:  01/04/02 TD:  01/04/02 Job: [id number]

## 2011-03-12 NOTE — Op Note (Signed)
Lisa [name], Lisa [name]                ACCOUNT NO.:  [id number]   MEDICAL RECORD NO.:  [id number]          PATIENT TYPE:  OIB   LOCATION:  5733                         FACILITY:  [facility]   PHYSICIAN:  John D. Ashley Royalty, M.D. DATE OF BIRTH:  02/24/1941   DATE OF PROCEDURE:  07/28/2006  DATE OF DISCHARGE:  07/29/2006                                 OPERATIVE REPORT   ADMISSION DIAGNOSIS:  Preretinal fibrosis and retinal break, right eye.   PROCEDURES:  Pars plana vitrectomy, membrane peel, intravitreal Kenalog  injection, retinal photocoagulation around retinal break in the right eye.   SURGEON:  Beulah Gandy. Ashley Royalty, M.D.   ASSISTANT:  Bryan Lemma. Lundquist, P.A.   ANESTHESIA:  General.   DETAILS:  Usual prep and drape.  The indirect ophthalmoscope laser moved  into place, 529 burns were placed around the retinal periphery with a power  of 440 milliwatts, 1000 microns each and 0.1 seconds each.  The retinal  break was surrounded at 4 o'clock region.  Attention was carried to the pars  plana area for peritomies at 8, 10 and 2 o'clock.  A 5 mm infusion port at 8  o'clock.  Contact lens ring at 6 and 12 o'clock.  The cutter and the light  pipe were placed at 10 and 2 o'clock, respectively.  Pars plana vitrectomy  with removal of vitreous membranes was performed.  Provisc was placed on the  corneal surface and the flat contact lens placed.  The macular region was  inspected and found to be thrown into folds.  The vitrectomy was carried  into the far periphery where all vitreous was removed down to the vitreous  base.  A piece of retina which had come from the retinal break at 4 o'clock  was removed.  The magnifying contact lens was placed and attention was  carried to the macular region.  The 27-gauge Eagle pick, the diamond-dusted  membrane scraper, the lighted pick and vitreous forceps were used to engage  the membrane and peel it from its attachment to the macular region.  Silicone tip suction  line was used to remove particles from this region.  The entire macula was uncovered from the membrane.  The membrane was removed  from the eye.  The periphery was inspected and no breaks were seen.  The  intravitreal Kenalog 0.1 mL was injected into the vitreous cavity.  The  instruments were removed from the eye.  A 9-0 nylon was used to close the  sclerotomy sites.  They were tested and found to be tight.  The conjunctiva  was closed with wet-field cautery.  Polymyxin and gentamicin were irrigated  into tenon's space.  Atropine solution was applied.  Marcaine was injected  around the globe for postop pain.  TobraDex ophthalmic ointment, a patch and  shield were placed.  Decadron 10 mg was injected to the lower  subconjunctival space.  The closing pressure was 10 with a Baer keratometer.  Complications none.  Duration 1 hour.  The patient was awakened and taken to  recovery in satisfactory condition.  Beulah Gandy. Ashley Royalty, M.D.  Electronically Signed     JDM/MEDQ  D:  07/28/2006  T:  07/29/2006  Job:  [id number]

## 2011-03-12 NOTE — Assessment & Plan Note (Signed)
[facility]                               PULMONARY OFFICE NOTE   NAME:[name], Lisa MCNEW                       MRN:          [id number]  DATE:07/13/2006                            DOB:          June 01, 1941    HISTORY:  A 70 year old black female with steroid-dependent sarcoid since  1983 having tapered prednisone down to 5 mg alternating with 2.5 mg per day  with her main complaint being one of chest congestion with minimal mucus  production.  No fever or chills, sweats, orthopnea, PND, or leg swelling.  She denies any overt signs of reflux symptoms, ocular or articular  complaints.   She is scheduled for eye surgery within the next several weeks and has  already been cleared for surgery from Dr. Marylen Ponto perspective.   PHYSICAL EXAMINATION:  She is a pleasant ambulatory black female in no acute  distress.  VITAL SIGNS:  Stable with no change in weight, 172 pounds.  HEENT:  Unremarkable.  Oropharynx clear.  LUNGS:  Fields reveal diminished breath sounds bilaterally.  No wheezing.  CARDIAC:  Regular rate and rhythm without murmur, gallop, or rub.  ABDOMEN:  Soft, benign.  EXTREMITIES:  Warm without calf tenderness, cyanosis, clubbing, edema.   Hemoglobin saturation 97% on room air.   IMPRESSION:  Sarcoid steroid dependent with no evidence of active  interstitial or airways inflammation on her present complex medical regimen  that was reviewed with her in detail and writing in the form of a medication  calendar.   After surgery I might be tempted to try to reduce the floor of prednisone  down to 1/2 tablet daily, but would ask her to come back for a set of PFTs  and a chest x-ray first.                                   Casimiro Needle B. Sherene Sires, MD, [facility]   MBW/MedQ  DD:  07/13/2006  DT:  07/14/2006  Job #:  [id number]   cc:   Brooke Bonito, M.D.

## 2011-03-12 NOTE — Assessment & Plan Note (Signed)
[facility]                             PULMONARY OFFICE NOTE   NAME:Calandro, DOMENIQUE QUEST                       MRN:          [id number]  DATE:01/23/2007                            DOB:          1941-05-20    HISTORY:  A 70 year old black female with steroid-dependent sarcoid  since 1983 who has now tapered down to prednisone 5 mg one-half daily  while maintaining on Qvar 80 two puffs b.i.d., Serevent DPI two puffs  b.i.d., and generally doing quite well.  She denies any exertional chest  pain, dyspnea, orthopnea, PND or leg swelling, fevers, chills, sweats,  ocular or articular complaints.   For full inventory of medication, please see face sheet column dated  January 23, 2007, noting the patient now carries her own medication  calendar that correlates nicely with our medication face sheet since she  uses the same format as the original calendar.   She states she is taking virtually no p.r.n.'s at this point.   PHYSICAL EXAMINATION:  GENERAL:  She is an anxious but quite pleasant  ambulatory black female in no acute distress.  VITAL SIGNS:  She is afebrile with normal vital signs.  HEENT:  Unremarkable, oropharynx clear.  LUNG FIELDS:  Reveal diminished breath sounds bilaterally, no wheezing.  HEART:  There is regular rate and rhythm without murmur, gallop or rub.  ABDOMEN:  Soft, benign.  EXTREMITIES:  Warm without calf tenderness, cyanosis, clubbing, or  edema.   IMPRESSION:  No evidence of active parenchymal, systemic or airway  sarcoid on her present regimen consisting of basically physiologic doses  of prednisone at 5 mg one-half daily, Qvar 80 two puffs b.i.d. to  control airways inflammation, and Serevent two puffs b.i.d. to control  the bronchospastic component.   By trial and error we have found that she cannot taper off the Serevent  so I have asked her to continue it, reasoning that the prednisone should  cover the possibility of Serevent  tachyphylaxis from developing and  because she is going to be on systemic steroids the rest of her life,  having been on them for 25 years now.   Followup will be every 3 months, sooner if needed.  On her next visit, a  set of PFTs and chest x-ray are due.     Charlaine Dalton. Sherene Sires, MD, [facility]  Electronically Signed    MBW/MedQ  DD: 01/23/2007  DT: 01/23/2007  Job #: [id number]

## 2011-03-12 NOTE — Assessment & Plan Note (Signed)
[facility]                               PULMONARY OFFICE NOTE   NAME:[name], [name]                       MRN:          [id number]  DATE:07/06/2006                            DOB:          08-11-1941    HISTORY OF PRESENT ILLNESS:  The patient is a 70 year old African American  female patient of Dr. 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  The patient presents today for a two week onset of nasal congestion,  post nasal drip and cough.  The patient has taken a Ketac pack with the last  dose being today.  She denies any chest pain, shortness of breath,  orthopnea, PND or leg swelling.  The patient does report she has been having  some visual difficulties recently and is being evaluated by her  ophthalmologist.   PHYSICAL EXAMINATION:  GENERAL:  The patient is a pleasant female in no  acute distress.  VITAL SIGNS:  She is afebrile with stable vital signs.  O2 saturation is 97%  on room air.  HEENT:  Nasal mucosa __________  NECK:  Supple without adenopathy.  LUNGS:  Sounds reveal few expiratory wheezes.  CARDIAC:  Regular rate and rhythm.  ABDOMEN:  Soft, nontender.  EXTREMITIES:  Warm without any edema.   IMPRESSION/PLAN:  Acute tracheal bronchitis flare.  The patient has been  given a prednisone pack over the next week.  Add in Mucinex DM b.i.d.  Patient to finish Ketac as schedule.  Patient will return back with Dr. Sherene Sires  in two weeks or sooner if needed.                                   Rubye Oaks, NP                                Charlaine Dalton. Sherene Sires, MD, [facility]   TP/MedQ  DD:  07/07/2006  DT:  07/08/2006  Job #:  [id number]

## 2011-03-12 NOTE — Procedures (Signed)
[facility]  Patient:    Lisa [name], Lisa [name] Visit Number: [id number] MRN: [id number]          Service Type: END Location: ENDO Attending Physician:  Sabino Gasser Dictated by:   Sabino Gasser, M.D. Proc. Date: 01/04/02 Admit Date:  01/04/2002                             Procedure Report  PROCEDURE:  Upper endoscopy.  INDICATION:  GERD, weight loss.  ANESTHESIA:  Demerol 50, Versed 5 mg.  DESCRIPTION OF PROCEDURE:  With patient mildly sedated in the left lateral decubitus position, the Olympus videoscopic endoscope was inserted into the mouth and passed under direct vision through the esophagus, which appeared normal, into the stomach; fundus, body, antrum, duodenal bulb, and second portion of duodenum all appeared normal.  From this point, the endoscope was slowly withdrawn, taking circumferential views of the entire duodenal mucosa until the endoscope then pulled back into the stomach and placed in retroflexion to view the stomach from below.  The endoscope was then straightened and withdrawn, taking circumferential views of the entire gastric and esophageal mucosa.  The patients vital signs and pulse oximeter remained stable.  The patient tolerated the procedure well without apparent complications.  FINDINGS:  Negative examination.  PLAN:  Proceed to colonoscopy. Dictated by:   Sabino Gasser, M.D. Attending Physician:  Sabino Gasser DD:  01/04/02 TD:  01/04/02 Job: [id number]

## 2011-08-05 NOTE — Progress Notes (Signed)
Subjective:     Patient ID: Lisa [name], female   DOB: [birth year], 70 y.o.   MRN: [id number]  HPI  28 yobf never smoker diagnosed with sarcoidosis with parenchymal and airway involvement in 1983 at [facility]. At one point she also had significant sinus involvement and had remained prednisone dependent since 1983 and finally  Stopped daily rx  without flare 06/2008   August 06, 2008 ov no prednisone x 1 month(inadvertently stopped without adverse effects) and no symptoms no am nausea or breathing no worse on servent and qvar    February 05, 2009 off serevent, using qvar 80 2 two times a day with no increase need for rescue vs while on serevent. rec leave off serevent    May 04, 2010 3 month followup. Pt states that her breathing has been okay as long as she does not stay outside in the hot weather for too long. She denies cough but c/o hoarsness x 1 wk. rec consider off biphosphonates   May 20, 2010 Acute visit. Pt c/o prod cough with yellow to green sputum x 4 days. She states "coughs all night long". She started biaxin 500 mg bid x 4 days ago with no improvement. c/o mouth allergies from foods, lost list of food allergies supplied by [facility] and wants f/u here.    June 03, 2010 -Presents for follow up and med review. Seen last visit w/ persistent cough tx w/ steroid taper. Says her cough is improved. Has upcoming visit with Dr. Sharyn Lull to evaluate for possible food allergies. We reveiwed her meds and updated her med calendar. She is feeling better more toward her baseline. She remains off bisphosphonates. rec no change rx, follow med calendar    08/06/11 Avonelle Viveros/ov no longer following med calendar  cc sob only  x steps, flat surface slt slower but goes anywhere she wants. No cough.   Sleeping ok without nocturnal  or early am exacerbation  of respiratory  c/o's or need for noct saba. Also denies any obvious fluctuation of symptoms with weather or environmental changes or other aggravating or alleviating  factors except as outlined above    Pt denies any significant sore throat, dysphagia, itching, sneezing, nasal congestion or excess secretions, fever, chills, sweats, unintended wt loss, pleuritic or exertional cp, hempoptysis, change in activity tolerance orthopnea pnd or leg swelling.   Allergies 1) ! Asa  2) ! Valium   Past Medical History:  SARCOIDOSIS (ICD-135)  - On prednisone 1983 ([facility]) > off 9/09 ([facility])  - PFT's 04/24/07 FEV1 1.62 (72%) ratio 63, no resp to Bronchodilators, DLC0 61% (on prednisone)  - PFT's 02/05/09 FEV1 1.59 (72%) ratio 66 no resp to bronchodilators DLC0 63 (off prednisone)  - HFA technique 100% February 05, 2009 with autohaler,  100% 08/06/2011  MVP syndrome...............................................................................................Marland Kitchen Dr. Lucas Mallow  Osteopenia..  - DEXA 09/08/2009 T spine 1.2, L Fem Neck -1.6, RFem neck -1.9  Food allergies dx at [facility]  - Referred to [facility] for food allergies May 20, 2010     Review of Systems     Objective:   Physical Exam  anxious but pleasant amb bf nad  wt 160 August 19, 2009 > 170 January 16, 2010 > 162 May 04, 2010 >> 158 August 26, 2010 > 165 08/06/2011  HEENT mild turbinate edema. Oropharynx no thrush or excess pnd or cobblestoning. No JVD or cervical adenopathy. Mild accessory muscle hypertrophy. Trachea midline, nl thryroid. Chest was hyperinflated by percussion with diminished breath sounds and moderate increased  exp time without wheeze. Hoover sign positive at mid inspiration. Regular rate and rhythm without murmur gallop or rub or increase P2 or edema. Abd: no hsm, nl excursion. Ext warm without cyanosis or clubbing.     Assessment:         Plan:

## 2011-08-06 ENCOUNTER — Ambulatory Visit (INDEPENDENT_AMBULATORY_CARE_PROVIDER_SITE_OTHER): Payer: Medicare Other | Admitting: Internal Medicine

## 2011-08-06 ENCOUNTER — Encounter: Payer: Self-pay | Admitting: Internal Medicine

## 2011-08-06 VITALS — BP 114/62 | HR 67 | Temp 98.1°F | Ht 67.0 in | Wt 165.2 lb

## 2011-08-06 DIAGNOSIS — D869 Sarcoidosis, unspecified: Secondary | ICD-10-CM

## 2011-08-06 DIAGNOSIS — Z23 Encounter for immunization: Secondary | ICD-10-CM

## 2011-08-06 MED ORDER — BECLOMETHASONE DIPROPIONATE 80 MCG/ACT IN AERS: 2.0000 | INHALATION_SPRAY | Freq: Two times a day (BID) | RESPIRATORY_TRACT | Status: DC

## 2011-08-06 NOTE — Assessment & Plan Note (Signed)
The goal with a chronic steroid dependent illness is always arriving at the lowest effective dose that controls the disease/symptoms and not accepting a set "formula" which is based on statistics or guidelines that don't always take into account patient  variability or the natural hx of the dz in every individual patient, which may well vary over time.  For now therefore I recommend the patient maintain  Off systemic steroids and just maintained on xolair  The proper method of use, as well as anticipated side effects, of this metered-dose inhaler are discussed and demonstrated to the patient. Imporved to 100% with coaching.

## 2011-08-06 NOTE — Patient Instructions (Signed)
Return in 6 months for follow up CXR

## 2011-08-07 ENCOUNTER — Encounter: Payer: Self-pay | Admitting: Internal Medicine

## 2011-08-09 ENCOUNTER — Other Ambulatory Visit: Payer: Self-pay | Admitting: *Deleted

## 2011-08-09 MED ORDER — BECLOMETHASONE DIPROPIONATE 80 MCG/ACT IN AERS: 2.0000 | INHALATION_SPRAY | Freq: Two times a day (BID) | RESPIRATORY_TRACT | Status: DC

## 2011-09-27 ENCOUNTER — Ambulatory Visit (INDEPENDENT_AMBULATORY_CARE_PROVIDER_SITE_OTHER): Payer: Medicare Other | Admitting: Ophthalmology

## 2011-09-27 DIAGNOSIS — H3530 Unspecified macular degeneration: Secondary | ICD-10-CM

## 2011-09-27 DIAGNOSIS — H35379 Puckering of macula, unspecified eye: Secondary | ICD-10-CM

## 2011-11-14 ENCOUNTER — Other Ambulatory Visit: Payer: Self-pay

## 2011-11-14 ENCOUNTER — Emergency Department (HOSPITAL_COMMUNITY): Payer: Medicare Other

## 2011-11-14 ENCOUNTER — Encounter (HOSPITAL_COMMUNITY): Payer: Self-pay | Admitting: Emergency Medicine

## 2011-11-14 ENCOUNTER — Emergency Department (HOSPITAL_COMMUNITY)
Admission: EM | Admit: 2011-11-14 | Discharge: 2011-11-14 | Disposition: A | Discharge status: Home / Self Care | Payer: Medicare Other | Attending: Emergency Medicine | Admitting: Emergency Medicine

## 2011-11-14 DIAGNOSIS — R079 Chest pain, unspecified: Secondary | ICD-10-CM | POA: Insufficient documentation

## 2011-11-14 DIAGNOSIS — D869 Sarcoidosis, unspecified: Secondary | ICD-10-CM | POA: Insufficient documentation

## 2011-11-14 DIAGNOSIS — R062 Wheezing: Secondary | ICD-10-CM | POA: Insufficient documentation

## 2011-11-14 DIAGNOSIS — M940 Chondrocostal junction syndrome [Tietze]: Secondary | ICD-10-CM | POA: Insufficient documentation

## 2011-11-14 LAB — BASIC METABOLIC PANEL
Chloride: 98 mEq/L (ref 96–112)
GFR calc Af Amer: >90 mL/min (ref >90)
GFR calc non Af Amer: 88 mL/min — ABNORMAL LOW (ref >90)
Potassium: 4.1 mEq/L (ref 3.5–5.1)
Sodium: 137 mEq/L (ref 135–145)

## 2011-11-14 LAB — CBC
Hemoglobin: 12.3 g/dL (ref 12.0–15.0)
MCHC: 33.3 g/dL (ref 30.0–36.0)
Platelets: 310 10*3/uL (ref 150–400)
RDW: 13 % (ref 11.5–15.5)

## 2011-11-14 LAB — DIFFERENTIAL
Basophils Absolute: 0.1 10*3/uL (ref 0.0–0.1)
Basophils Relative: 1 % (ref 0–1)
Eosinophils Absolute: 0.1 10*3/uL (ref 0.0–0.7)
Monocytes Relative: 12 % (ref 3–12)
Neutro Abs: 3.8 10*3/uL (ref 1.7–7.7)
Neutrophils Relative %: 66 % (ref 43–77)

## 2011-11-14 LAB — POCT I-STAT TROPONIN I: Troponin i, poc: 0 ng/mL (ref 0.00–0.08)

## 2011-11-14 LAB — D-DIMER, QUANTITATIVE: D-Dimer, Quant: 0.23 ug/mL-FEU (ref 0.00–0.48)

## 2011-11-14 MED ORDER — ALBUTEROL SULFATE HFA 108 (90 BASE) MCG/ACT IN AERS
2.0000 | INHALATION_SPRAY | RESPIRATORY_TRACT | Status: DC | PRN
  Administered 2011-11-14: 2 via RESPIRATORY_TRACT
  Filled 2011-11-14: qty 6.7

## 2011-11-14 MED ORDER — NAPROXEN 500 MG PO TABS: 500.0000 mg | ORAL_TABLET | Freq: Two times a day (BID) | ORAL | Status: DC

## 2011-11-14 MED ORDER — OXYCODONE-ACETAMINOPHEN 5-325 MG PO TABS
2.0000 | ORAL_TABLET | Freq: Once | ORAL | Status: AC
  Administered 2011-11-14: 2 via ORAL
  Filled 2011-11-14: qty 2

## 2011-11-14 MED ORDER — OXYCODONE-ACETAMINOPHEN 5-325 MG PO TABS: 1.0000 | ORAL_TABLET | Freq: Four times a day (QID) | ORAL | Status: AC | PRN

## 2011-11-14 MED ORDER — IBUPROFEN 800 MG PO TABS
800.0000 mg | ORAL_TABLET | Freq: Once | ORAL | Status: AC
  Administered 2011-11-14: 800 mg via ORAL
  Filled 2011-11-14: qty 1

## 2011-11-14 NOTE — ED Provider Notes (Signed)
History     CSN: [id number]  Arrival date & time 11/14/11  1418   First MD Initiated Contact with Patient 11/14/11 1532      Chief Complaint  Patient presents with  . Chest Pain  . Arm Pain    (Consider location/radiation/quality/duration/timing/severity/associated sxs/prior treatment) HPI Comments: Has had uri for the past week.  Pain is worse with movement and has been constant  Patient is a 71 y.o. female presenting with chest pain. The history is provided by the patient. No language interpreter was used.  Chest Pain The chest pain began 5 - 7 days ago. Chest pain occurs constantly. The chest pain is worsening. The pain is associated with breathing. The severity of the pain is moderate. The quality of the pain is described as aching. The pain does not radiate. Chest pain is worsened by deep breathing. Primary symptoms include wheezing. Pertinent negatives for primary symptoms include no fever, no fatigue, no cough, no palpitations, no nausea, no vomiting and no dizziness.  Pertinent negatives for associated symptoms include no diaphoresis, no lower extremity edema, no near-syncope, no numbness, no orthopnea, no paroxysmal nocturnal dyspnea and no weakness. She tried nothing for the symptoms. Risk factors include being elderly.     Past Medical History  Diagnosis Date  . Sarcoidosis   . Asthma     Past Surgical History  Procedure Date  . Hysterectomy with ovaries intact   . Cataract surgery 08/18/09    left eye    History reviewed. No pertinent family history.  History  Substance Use Topics  . Smoking status: Never Smoker   . Smokeless tobacco: Not on file  . Alcohol Use: No    OB History    Grav Para Term Preterm Abortions TAB SAB Ect Mult Living                  Review of Systems  Constitutional: Negative for fever, diaphoresis, activity change, appetite change and fatigue.  HENT: Positive for rhinorrhea. Negative for congestion, sore throat, neck pain and  neck stiffness.   Respiratory: Positive for wheezing. Negative for cough.   Cardiovascular: Negative for palpitations, orthopnea and near-syncope.  Gastrointestinal: Negative for nausea and vomiting.  Genitourinary: Negative for dysuria, urgency, frequency and flank pain.  Musculoskeletal: Negative for myalgias, back pain and arthralgias.  Neurological: Negative for dizziness, weakness, light-headedness and numbness.  All other systems reviewed and are negative.    Allergies  Aspirin and Diazepam  Home Medications   Current Outpatient Rx  Name Route Sig Dispense Refill  . BECLOMETHASONE DIPROPIONATE 80 MCG/ACT IN AERS Inhalation Inhale 2 puffs into the lungs 2 (two) times daily. 3 Inhaler 3  . VITAMIN D 1000 UNITS PO TABS Oral Take 1,000 Units by mouth daily.      Marland Kitchen DM-GUAIFENESIN ER 30-600 MG PO TB12 Oral Take 2 tablets by mouth every 12 (twelve) hours.      . CENTRUM SILVER PO Oral Take 1 tablet by mouth daily.      Marland Kitchen OMEPRAZOLE 20 MG PO CPDR Oral Take 20 mg by mouth daily. 30 minutes prior to the first meal of the day     . POTASSIUM CHLORIDE CRYS ER 20 MEQ PO TBCR Oral Take 20 mEq by mouth 2 (two) times daily. With meals      . NAPROXEN 500 MG PO TABS Oral Take 1 tablet (500 mg total) by mouth 2 (two) times daily. 30 tablet 0  . OXYCODONE-ACETAMINOPHEN 5-325 MG PO TABS Oral  Take 1-2 tablets by mouth every 6 (six) hours as needed for pain. 20 tablet 0    BP 120/78  Pulse 79  Temp(Src) 98.8 F (37.1 C) (Oral)  Resp 21  Wt 155 lb (70.308 kg)  SpO2 100%  Physical Exam  Nursing note and vitals reviewed. Constitutional: She is oriented to person, place, and time. She appears well-developed and well-nourished.       Appears uncomfortable  HENT:  Head: Normocephalic and atraumatic.  Mouth/Throat: Oropharynx is clear and moist. No oropharyngeal exudate.  Eyes: Conjunctivae and EOM are normal. Pupils are equal, round, and reactive to light.  Neck: Normal range of motion. Neck  supple.  Cardiovascular: Normal rate, regular rhythm, normal heart sounds and intact distal pulses.  Exam reveals no gallop and no friction rub.   No murmur heard. Pulmonary/Chest: Effort normal. No respiratory distress. She has wheezes. She exhibits tenderness.  Abdominal: Soft. Bowel sounds are normal. There is no tenderness.  Musculoskeletal: Normal range of motion. She exhibits no tenderness.  Neurological: She is alert and oriented to person, place, and time. No cranial nerve deficit.  Skin: Skin is warm and dry.    ED Course  Procedures (including critical care time)   Date: 11/14/2011  Rate: 68  Rhythm: normal sinus rhythm  QRS Axis: normal  Intervals: normal  ST/T Wave abnormalities: normal  Conduction Disutrbances:none  Narrative Interpretation:   Old EKG Reviewed: none available  Labs Reviewed  BASIC METABOLIC PANEL - Abnormal; Notable for the following:    GFR calc non Af Amer 88 (*)    All other components within normal limits  CBC  DIFFERENTIAL  POCT I-STAT TROPONIN I  D-DIMER, QUANTITATIVE  I-STAT TROPONIN I   Dg Chest 2 View  11/14/2011  *RADIOLOGY REPORT*  Clinical Data: Chest pain  CHEST - 2 VIEW  Comparison: 12/07/2010  Findings: Calcified lymph nodes are again noted.  Heart size is at the limits of normal.  Trace right pleural thickening or effusion persists.  Bilateral upper lobe scarring and granulomas are again noted.  No new focal pulmonary mass opacity.  No acute osseous finding.  IMPRESSION: No change in evidence of granulomatous disease without focal acute finding.  Original Report Authenticated By: Harrel Lemon, M.D.     1. Costochondritis, acute       MDM  Imaging negative. D-dimer is negative patient low risk for pulmonary embolus. Patient is a constant chest pain for 1 week. A single troponin was performed and negative. I feel this is adequate to rule out ACS as the cause of her symptoms. I feel her symptoms are secondary to  costochondritis. She received Naprosyn and Percocet in emergency department with a negative chest x-ray. These findings the patient at this time she'll be discharged home with instructions to followup with her primary care physician.        Dayton Bailiff, MD 11/14/11 [phone]

## 2011-11-14 NOTE — ED Notes (Signed)
States that she has right chest wall pain that is reproduced with palpation and when raising her right arm. Denies injury or lifting anything. States that she has had this pain for 1 week.

## 2011-11-15 ENCOUNTER — Telehealth: Payer: Self-pay | Admitting: Internal Medicine

## 2011-11-15 ENCOUNTER — Ambulatory Visit (INDEPENDENT_AMBULATORY_CARE_PROVIDER_SITE_OTHER): Payer: Medicare Other | Admitting: Emergency Medicine

## 2011-11-15 ENCOUNTER — Encounter: Payer: Self-pay | Admitting: Emergency Medicine

## 2011-11-15 DIAGNOSIS — J069 Acute upper respiratory infection, unspecified: Secondary | ICD-10-CM | POA: Insufficient documentation

## 2011-11-15 DIAGNOSIS — D869 Sarcoidosis, unspecified: Secondary | ICD-10-CM

## 2011-11-15 NOTE — Assessment & Plan Note (Signed)
-   discussed expected duration and that she should call us if cough or wheeze evolve

## 2011-11-15 NOTE — Assessment & Plan Note (Signed)
CXR stable. Do not believe she is flaring with this URI.  - stop the scheduled SABA, use prn

## 2011-11-15 NOTE — Progress Notes (Signed)
  Subjective:    Patient ID: Lisa [name], female    DOB: 09-06-1941, 71 y.o.   MRN: [id number]  HPI 84 yobf never smoker diagnosed with sarcoidosis with parenchymal and airway involvement in 1983 at [facility]. At one point she also had significant sinus involvement and had remained prednisone dependent since 1983 and finally 2009. Followed by Dr Sherene Sires for this as well as episodic persistent cough. She has been maintained on QVAR bid. She developed URI symptoms, clear rhinorrhea, developed some R upper chest pain, some lower flank pain. She hasn't developed cough. She had CXR that showed no new findings. She was given percocet with improvement, now her biggest complaint is the rhinorrhea. Has been using Mucinex D for a week. They also gave her ventolin to take q4h.      Review of Systems  Constitutional: Negative.  Negative for fever and unexpected weight change.  HENT: Negative.  Negative for ear pain, nosebleeds, congestion, sore throat, rhinorrhea, sneezing, trouble swallowing, dental problem, postnasal drip and sinus pressure.   Eyes: Negative.  Negative for redness and itching.  Respiratory: Positive for wheezing. Negative for cough, chest tightness and shortness of breath.   Cardiovascular: Positive for chest pain. Negative for palpitations and leg swelling.  Gastrointestinal: Negative.  Negative for nausea and vomiting.  Genitourinary: Negative.  Negative for dysuria.  Musculoskeletal: Negative.  Negative for joint swelling.  Skin: Negative.  Negative for rash.  Neurological: Negative.  Negative for headaches.  Hematological: Negative.  Does not bruise/bleed easily.  Psychiatric/Behavioral: Negative.  Negative for dysphoric mood. The patient is not nervous/anxious.    CXR 11/14/11:  Comparison: 12/07/2010  Findings: Calcified lymph nodes are again noted. Heart size is at  the limits of normal. Trace right pleural thickening or effusion  persists. Bilateral upper lobe scarring and  granulomas are again  noted. No new focal pulmonary mass opacity. No acute osseous  finding.  IMPRESSION:  No change in evidence of granulomatous disease without focal acute  finding.      Objective:   Physical Exam  Gen: Pleasant, well-nourished, in no distress,  normal affect  ENT: No lesions,  mouth clear,  oropharynx clear, no postnasal drip  Neck: No JVD, no TMG, no carotid bruits  Lungs: No use of accessory muscles, no dullness to percussion, clear without rales or rhonchi  Cardiovascular: RRR, heart sounds normal, no murmur or gallops, no peripheral edema  Musculoskeletal: No deformities, no cyanosis or clubbing  Neuro: alert, non focal  Skin: Warm, no lesions or rashes     Assessment & Plan:  Sarcoidosis CXR stable. Do not believe she is flaring with this URI.  - stop the scheduled SABA, use prn  URI (upper respiratory infection) - discussed expected duration and that she should call us if cough or wheeze evolve

## 2011-11-15 NOTE — Patient Instructions (Signed)
Stop using Ventolin on a schedule. He may use this 2 puffs up to every 4 hours if you hear wheezing or have shortness of breath Try taking vitamin C, Echinacea, decongestants including chlorpheniramine or brompheniramine (in the ingredients).  Call our office if you develop wheezing or cough Follow up with Dr. Sherene Sires as already scheduled

## 2011-11-15 NOTE — Telephone Encounter (Signed)
Note: i called pt back and offered her an appt w/ RB this afternoon at 2. She will take this appt. Lisa [name]

## 2012-02-14 ENCOUNTER — Encounter: Payer: Self-pay | Admitting: Internal Medicine

## 2012-02-14 ENCOUNTER — Ambulatory Visit (INDEPENDENT_AMBULATORY_CARE_PROVIDER_SITE_OTHER): Payer: Medicare Other | Admitting: Internal Medicine

## 2012-02-14 DIAGNOSIS — D869 Sarcoidosis, unspecified: Secondary | ICD-10-CM

## 2012-02-14 DIAGNOSIS — J45909 Unspecified asthma, uncomplicated: Secondary | ICD-10-CM

## 2012-02-14 DIAGNOSIS — J4530 Mild persistent asthma, uncomplicated: Secondary | ICD-10-CM | POA: Insufficient documentation

## 2012-02-14 MED ORDER — PIRBUTEROL ACETATE 200 MCG/INH IN AERB: 2.0000 | INHALATION_SPRAY | Freq: Four times a day (QID) | RESPIRATORY_TRACT | Status: DC

## 2012-02-14 NOTE — Progress Notes (Signed)
Subjective:     Patient ID: Lisa [name], female   DOB: [birth year], 71 y.o.   MRN: [id number]  HPI  72 yobf never smoker diagnosed with sarcoidosis with parenchymal and airway involvement in 1983 at [facility]. At one point she also had significant sinus involvement and had remained prednisone dependent since 1983 and finally  Stopped daily rx  without flare 06/2008   August 06, 2008 ov no prednisone x 1 month(inadvertently stopped without adverse effects) and no symptoms no am nausea or breathing no worse on servent and qvar    February 05, 2009 off serevent, using qvar 80 2 two times a day with no increase need for rescue vs while on serevent. rec leave off serevent    May 04, 2010 3 month followup. Pt states that her breathing has been okay as long as she does not stay outside in the hot weather for too long. She denies cough but c/o hoarsness x 1 wk. rec consider off biphosphonates   May 20, 2010 Acute visit. Pt c/o prod cough with yellow to green sputum x 4 days. She states "coughs all night long". She started biaxin 500 mg bid x 4 days ago with no improvement. c/o mouth allergies from foods, lost list of food allergies supplied by [facility] and wants f/u here.    June 03, 2010 -Presents for follow up and med review. Seen last visit w/ persistent cough tx w/ steroid taper. Says her cough is improved. Has upcoming visit with Dr. Sharyn Lull to evaluate for possible food allergies. We reveiwed her meds and updated her med calendar. She is feeling better more toward her baseline. She remains off bisphosphonates. rec no change rx, follow med calendar    08/06/11 Lisa [name]/ov no longer following med calendar  cc sob only  x steps, flat surface slt slower but goes anywhere she wants. No cough.  rec no change rx  11/15/11 Lisa [name] OV > overusing ventolin, asked to just take q 4 h prn and use otc's for pnds   02/14/2012 f/u ov/Lisa [name] cc all better, rarely needing maxair, given ventolin in ER ? Why if already had maxair,  misunderstood instructions though has med calendar she say she showed the er staff.   No cough ,  Breathing better p drinking cold but rarely limiting sob with exertion.  Sleeping ok without nocturnal  or early am exacerbation  of respiratory  c/o's or need for noct saba. Also denies any obvious fluctuation of symptoms with weather or environmental changes or other aggravating or alleviating factors except as outlined above    ROS  At present neg for  any significant sore throat, dysphagia, dental problems, itching, sneezing,  nasal congestion or excess/ purulent secretions, ear ache,   fever, chills, sweats, unintended wt loss, pleuritic or exertional cp, hemoptysis, palpitations, orthopnea pnd or leg swelling.  Also denies presyncope, palpitations, heartburn, abdominal pain, anorexia, nausea, vomiting, diarrhea  or change in bowel or urinary habits, change in stools or urine, dysuria,hematuria,  rash, arthralgias, visual complaints, headache, numbness weakness or ataxia or problems with walking or coordination. No noted change in mood/affect or memory.        Allergies 1) ! Asa  2) ! Valium   Past Medical History:  SARCOIDOSIS (ICD-135)  - On prednisone 1983 ([facility]) > off 9/09 (Lisa [name])  - PFT's 04/24/07 FEV1 1.62 (72%) ratio 63, no resp to Bronchodilators, DLC0 61% (on prednisone)  - PFT's 02/05/09 FEV1 1.59 (72%) ratio 66 no resp to bronchodilators DLC0  63 (off prednisone)  - HFA technique 100% February 05, 2009 with autohaler,  100% 08/06/2011  MVP syndrome...............................................................................................Marland Kitchen Dr. Lucas Mallow  Osteopenia..  - DEXA 09/08/2009 T spine 1.2, L Fem Neck -1.6, RFem neck -1.9  Food allergies dx at [facility]  - Referred to [facility] for food allergies May 20, 2010         Objective:   Physical Exam  anxious but pleasant amb bf nad  wt 160 August 19, 2009 > 170 January 16, 2010 > 165 08/06/2011 > 02/14/2012  173 HEENT mild turbinate  edema. Oropharynx no thrush or excess pnd or cobblestoning. No JVD or cervical adenopathy. Mild accessory muscle hypertrophy. Trachea midline, nl thryroid. Chest was hyperinflated by percussion with diminished breath sounds and moderate increased exp time without wheeze. Hoover sign positive at mid inspiration. Regular rate and rhythm without murmur gallop or rub or increase P2 or edema. Abd: no hsm, nl excursion. Ext warm without cyanosis or clubbing.   11/14/11 No change in evidence of granulomatous disease without focal acute  finding.     Assessment:         Plan:

## 2012-02-14 NOTE — Assessment & Plan Note (Signed)
CXR  from 11/14/11 > no evidence active sarcoid

## 2012-02-14 NOTE — Assessment & Plan Note (Addendum)
-   PFT's 02/05/09  FEV1  1.59 (72%) ratio 66 and DLCO 63 corrects to 109  I had an extended discussion with the patient today lasting 15 to 20 minutes of a 25 minute visit on the following issues:    All goals of chronic asthma control met including optimal (though not nl) function and elimination of symptoms with minimal need for rescue therapy.  Contingencies discussed in full including contacting this office immediately if not controlling the symptoms using the rule of two's.      Each maintenance medication was reviewed in detail including most importantly the difference between maintenance and as needed and under what circumstances the prns are to be used. This was done in the context of a medication calendar review which provided the patient with a user-friendly unambiguous mechanism for medication administration and reconciliation and provides an action plan for all active problems. It is critical that this be shown to every doctor  for modification during the office visit if necessary so the patient can use it as a working document.      In this context it's not clear why she was given ventolin in the ER and understood she should take it every 4 hours when she already had prn maxair listed on the calendar and in her purse.  I apologized for the confusion and corrected our mar to reflect this recommendation.

## 2012-02-14 NOTE — Patient Instructions (Addendum)
See calendar for specific medication instructions and bring it back for each and every office visit for every healthcare provider you see.  Without it,  you may not receive the best quality medical care that we feel you deserve.  You will note that the calendar groups together  your maintenance  medications that are timed at particular times of the day.  Think of this as your checklist for what your doctor has instructed you to do until your next evaluation to see what benefit  there is  to staying on a consistent group of medications intended to keep you well.  The other group at the bottom is entirely up to you to use as you see fit  for specific symptoms that may arise between visits that require you to treat them on an as needed basis.  Think of this as your action plan or "what if" list.   Separating the top medications from the bottom group is fundamental to providing you adequate care going forward.    Only use your maxair as a rescue medication to be used if you can't catch your breath by resting or doing a relaxed purse lip breathing pattern. The less you use it, the better it will work when you need it.  If you find you start needing the maxair more for any reason, contact me ASAP for evaluation.  Please schedule a follow up visit in 3 months but call sooner if needed   Add: Needs pft's on return

## 2012-02-17 DIAGNOSIS — M1991 Primary osteoarthritis, unspecified site: Secondary | ICD-10-CM | POA: Insufficient documentation

## 2012-05-17 ENCOUNTER — Ambulatory Visit: Payer: Medicare Other | Admitting: Internal Medicine

## 2012-06-07 ENCOUNTER — Ambulatory Visit: Payer: Medicare Other | Admitting: Internal Medicine

## 2012-06-21 ENCOUNTER — Telehealth: Payer: Self-pay | Admitting: Internal Medicine

## 2012-06-21 NOTE — Telephone Encounter (Signed)
This is a dr. Sherene Sires pt and will give to leslie to hold for pt appt on 06/28/12

## 2012-06-21 NOTE — Telephone Encounter (Signed)
Spoke with patient-aware to keep her OV with MW on 06-28-12; she can discuss her breathing changes then. Aware that she doesn't qualify from lung perspective.

## 2012-06-21 NOTE — Telephone Encounter (Signed)
Does not qualify from a lung perspective, if breathing's not good enough to walk across a parking lot,  Ov needed to figure out why

## 2012-06-27 ENCOUNTER — Ambulatory Visit (INDEPENDENT_AMBULATORY_CARE_PROVIDER_SITE_OTHER): Payer: Medicare Other | Admitting: Ophthalmology

## 2012-06-28 ENCOUNTER — Ambulatory Visit (INDEPENDENT_AMBULATORY_CARE_PROVIDER_SITE_OTHER): Payer: Medicare Other | Admitting: Internal Medicine

## 2012-06-28 ENCOUNTER — Other Ambulatory Visit: Payer: Self-pay | Admitting: Internal Medicine

## 2012-06-28 ENCOUNTER — Encounter: Payer: Self-pay | Admitting: Internal Medicine

## 2012-06-28 VITALS — BP 130/70 | HR 83 | Temp 98.1°F | Ht 73.0 in | Wt 164.0 lb

## 2012-06-28 DIAGNOSIS — G459 Transient cerebral ischemic attack, unspecified: Secondary | ICD-10-CM

## 2012-06-28 DIAGNOSIS — J45909 Unspecified asthma, uncomplicated: Secondary | ICD-10-CM

## 2012-06-28 DIAGNOSIS — D869 Sarcoidosis, unspecified: Secondary | ICD-10-CM

## 2012-06-28 LAB — PULMONARY FUNCTION TEST

## 2012-06-28 MED ORDER — PIRBUTEROL ACETATE 200 MCG/INH IN AERB: INHALATION_SPRAY | RESPIRATORY_TRACT | Status: DC

## 2012-06-28 MED ORDER — MOMETASONE FURO-FORMOTEROL FUM 200-5 MCG/ACT IN AERO: INHALATION_SPRAY | RESPIRATORY_TRACT | Status: DC

## 2012-06-28 NOTE — Assessment & Plan Note (Addendum)
-   PFT's 02/05/09  FEV1  1.59 (72%) ratio 66 and DLCO 63 corrects to 109    - PFT's 06/28/2012 FEV1  1.30 (61%) ratio 59 and DLCO 62 corrects to 113  Moderate airflow obstruction with partial reversibility is likely due to airway involvement with sarcoid so rec change qvar 80 to dulera 200 2bid x 3 months to see if helps ex tolerance.    Each maintenance medication was reviewed in detail including most importantly the difference between maintenance and as needed and under what circumstances the prns are to be used.  Please see instructions for details which were reviewed in writing and the patient given a copy.

## 2012-06-28 NOTE — Progress Notes (Signed)
Subjective:     Patient ID: Lisa [name], female   DOB: 10-May-1941    MRN: [id number]  HPI  12 yobf never smoker diagnosed with sarcoidosis with parenchymal and airway involvement in 1983 at [facility]. At one point she also had significant sinus involvement and had remained prednisone dependent since 1983 and finally  Stopped daily rx  without flare 06/2008   August 06, 2008 ov no prednisone x 1 month(inadvertently stopped without adverse effects) and no symptoms no am nausea or breathing no worse on servent and qvar    February 05, 2009 off serevent, using qvar 80 2 two times a day with no increase need for rescue vs while on serevent. rec leave off serevent    May 04, 2010 3 month followup. Pt states that her breathing has been okay as long as she does not stay outside in the hot weather for too long. She denies cough but c/o hoarsness x 1 wk. rec consider off biphosphonates   May 20, 2010 Acute visit. Pt c/o prod cough with yellow to green sputum x 4 days. She states "coughs all night long". She started biaxin 500 mg bid x 4 days ago with no improvement. c/o mouth allergies from foods, lost list of food allergies supplied by [facility] and wants f/u here.    June 03, 2010 -Presents for follow up and med review. Seen last visit w/ persistent cough tx w/ steroid taper. Says her cough is improved. Has upcoming visit with Dr. Sharyn Lull to evaluate for possible food allergies. We reveiwed her meds and updated her med calendar. She is feeling better more toward her baseline. She remains off bisphosphonates. rec no change rx, follow med calendar    08/06/11 [facility]/ov no longer following med calendar  cc sob only  x steps, flat surface slt slower but goes anywhere she wants. No cough.  rec no change rx  11/15/11 [place] OV > overusing ventolin, asked to just take q 4 h prn and use otc's for pnds   02/14/2012 f/u ov/[facility] cc all better, rarely needing maxair, given ventolin in ER ? Why if already had maxair,  misunderstood instructions though has med calendar she say she showed the er staff.   No cough ,  Breathing better p drinking cold but rarely limiting sob with exertion. rec See calendar for specific medication instructions and bring it back Only use your maxair as a rescue medication       06/28/2012 f/u ov/[facility] no calendar cc doe x across the parking lot and requesting hc parking on qvar 2 bid- No obvious daytime variabilty or assoc chronic cough or cp or chest tightness, subjective wheeze overt sinus or hb symptoms. No unusual exp hx    Sleeping ok without nocturnal  or early am exacerbation  of respiratory  c/o's or need for noct saba. Also denies any obvious fluctuation of symptoms with weather or environmental changes or other aggravating or alleviating factors except as outlined above    ROS  At present neg for  any significant sore throat, dysphagia, dental problems, itching, sneezing,  nasal congestion or excess/ purulent secretions, ear ache,   fever, chills, sweats, unintended wt loss, pleuritic or exertional cp, hemoptysis, palpitations, orthopnea pnd or leg swelling.  Also denies presyncope, palpitations, heartburn, abdominal pain, anorexia, nausea, vomiting, diarrhea  or change in bowel or urinary habits, change in stools or urine, dysuria,hematuria,  rash, arthralgias, visual complaints, headache, numbness weakness or ataxia x 10 min 9/1 resolved p taking  asa or problems with walking or coordination. No noted change in mood/affect or memory.        Allergies 1) ! Asa  2) ! Valium   Past Medical History:  SARCOIDOSIS (ICD-135)  - On prednisone 1983 ([facility]) > off 06/2008 ([facility])  - PFT's 04/24/07 FEV1 1.62 (72%) ratio 63, no resp to Bronchodilators, DLC0 61% (on prednisone)  - PFT's 02/05/09 FEV1 1.59 (72%) ratio 66 no resp to bronchodilators DLC0 63 (off prednisone)  - HFA technique 100% February 05, 2009 with autohaler,  100% 08/06/2011  MVP  syndrome...............................................................................................Marland Kitchen Dr. Lucas Mallow  Osteopenia..  - DEXA 09/08/2009 T spine 1.2, L Fem Neck -1.6, RFem neck -1.9  Food allergies dx at [facility]  - Referred to [facility] for food allergies May 20, 2010         Objective:   Physical Exam  anxious but pleasant amb bf nad  wt 160 August 19, 2009 > 170 January 16, 2010 > 165 08/06/2011 > 02/14/2012  173 >  06/28/2012 164  HEENT mild turbinate edema. Oropharynx no thrush or excess pnd or cobblestoning. No JVD or cervical adenopathy. Mild accessory muscle hypertrophy. Trachea midline, nl thryroid. Chest was hyperinflated by percussion with diminished breath sounds and moderate increased exp time without wheeze. Hoover sign positive at mid inspiration. Regular rate and rhythm without murmur gallop or rub or increase P2 or edema. Abd: no hsm, nl excursion. Ext warm without cyanosis or clubbing.   11/14/11 No change in evidence of granulomatous disease without focal acute  finding.     Assessment:         Plan:

## 2012-06-28 NOTE — Assessment & Plan Note (Addendum)
-   On prednisone 1983 ([facility]) > off 06/2008 [facility])   The goal with a chronic steroid dependent illness is always arriving at the lowest effective dose that controls the disease/symptoms and not accepting a set "formula" which is based on statistics or guidelines that don't always take into account patient  variability or the natural hx of the dz in every individual patient, which may well vary over time.  For now therefore I recommend the patient maintain  Off systemic rx because other than reversible airways dz there is no evidence of dz activity at this point>  rx with topicals only

## 2012-06-28 NOTE — Patient Instructions (Addendum)
Stop qvar  Start dulera 200 Take 2 puffs first thing in am and then another 2 puffs about 12 hours later.   Only use your albuterol (maxair) as a rescue medication to be used if you can't catch your breath by resting or doing a relaxed purse lip breathing pattern. The less you use it, the better it will work when you need it.   Please schedule a follow up visit in 3 months but call sooner if needed

## 2012-06-28 NOTE — Progress Notes (Signed)
PFT done today. 

## 2012-07-03 ENCOUNTER — Other Ambulatory Visit: Payer: Medicare Other

## 2012-07-05 ENCOUNTER — Ambulatory Visit (INDEPENDENT_AMBULATORY_CARE_PROVIDER_SITE_OTHER): Payer: Medicare Other | Admitting: Ophthalmology

## 2012-07-05 DIAGNOSIS — H33309 Unspecified retinal break, unspecified eye: Secondary | ICD-10-CM

## 2012-07-05 DIAGNOSIS — H43819 Vitreous degeneration, unspecified eye: Secondary | ICD-10-CM

## 2012-07-05 DIAGNOSIS — H3530 Unspecified macular degeneration: Secondary | ICD-10-CM

## 2012-07-05 DIAGNOSIS — H35379 Puckering of macula, unspecified eye: Secondary | ICD-10-CM

## 2012-07-06 ENCOUNTER — Other Ambulatory Visit: Payer: Self-pay | Admitting: Internal Medicine

## 2012-07-06 DIAGNOSIS — G459 Transient cerebral ischemic attack, unspecified: Secondary | ICD-10-CM

## 2012-07-12 ENCOUNTER — Ambulatory Visit
Admission: RE | Admit: 2012-07-12 | Discharge: 2012-07-12 | Disposition: A | Discharge status: Home / Self Care | Payer: Medicare Other | Source: Ambulatory Visit | Attending: Internal Medicine | Admitting: Internal Medicine

## 2012-07-12 DIAGNOSIS — G459 Transient cerebral ischemic attack, unspecified: Secondary | ICD-10-CM

## 2012-07-20 ENCOUNTER — Encounter (HOSPITAL_COMMUNITY): Payer: Self-pay | Admitting: *Deleted

## 2012-07-20 ENCOUNTER — Emergency Department (HOSPITAL_COMMUNITY)
Admission: EM | Admit: 2012-07-20 | Discharge: 2012-07-20 | Disposition: A | Discharge status: Home / Self Care | Payer: Medicare Other | Attending: Emergency Medicine | Admitting: Emergency Medicine

## 2012-07-20 ENCOUNTER — Encounter: Payer: Self-pay | Admitting: Internal Medicine

## 2012-07-20 DIAGNOSIS — J45909 Unspecified asthma, uncomplicated: Secondary | ICD-10-CM | POA: Insufficient documentation

## 2012-07-20 DIAGNOSIS — S139XXA Sprain of joints and ligaments of unspecified parts of neck, initial encounter: Secondary | ICD-10-CM | POA: Insufficient documentation

## 2012-07-20 DIAGNOSIS — D869 Sarcoidosis, unspecified: Secondary | ICD-10-CM | POA: Insufficient documentation

## 2012-07-20 DIAGNOSIS — S161XXA Strain of muscle, fascia and tendon at neck level, initial encounter: Secondary | ICD-10-CM

## 2012-07-20 DIAGNOSIS — X58XXXA Exposure to other specified factors, initial encounter: Secondary | ICD-10-CM | POA: Insufficient documentation

## 2012-07-20 MED ORDER — KETOROLAC TROMETHAMINE 30 MG/ML IJ SOLN
30.0000 mg | Freq: Once | INTRAMUSCULAR | Status: AC
  Administered 2012-07-20: 30 mg via INTRAMUSCULAR
  Filled 2012-07-20: qty 1

## 2012-07-20 NOTE — ED Notes (Signed)
Pt to ED c/o cervical neck pain that radiates to R shoulder.  Pain was accompanied with diaphoresis.  Pain is chronic, but increased today.  Pt is d/t have an MRI tomorrow at 10 am before her surgical appointment. Neuro intact.

## 2012-07-20 NOTE — ED Provider Notes (Signed)
History     CSN: [id number]  Arrival date & time 07/20/12  1953   None     Chief Complaint  Patient presents with  . Neck Pain    (Consider location/radiation/quality/duration/timing/severity/associated sxs/prior treatment) HPI History provided by pt and prior chart.  Pt has had non-traumatic pain in her posterior neck w/ radiation into posterior scalp and right shoulder x several months.  Pain has recently increased and when it is at its worst, it is associated w/ diaphoresis.  2 weeks ago, the pain was severe, she became very diaphoretic and then her left leg gave out on her.  This evening, while lying in bed, the pain was severe, she became diaphoretic and was afraid her leg would give out on her again.  She therefore came to ED for further evaluation.  Denies extremity weakness/paresthesias, fever and loss of control of bladder/bowels.  Denies dysarthria, dysphagia, vision changes and ataxia.  Denies CP/SOB.  Per prior chart, pt had an MRI/MRA of the brain on 9/14 that was negative.  She is scheduled to f/u with NS tomorrow for MRI of cervical spine.    Past Medical History  Diagnosis Date  . Sarcoidosis   . Asthma     Past Surgical History  Procedure Date  . Hysterectomy with ovaries intact   . Cataract surgery 08/18/09    left eye    No family history on file.  History  Substance Use Topics  . Smoking status: Never Smoker   . Smokeless tobacco: Not on file  . Alcohol Use: No    OB History    Grav Para Term Preterm Abortions TAB SAB Ect Mult Living                  Review of Systems  All other systems reviewed and are negative.    Allergies  Aspirin and Diazepam  Home Medications   Current Outpatient Rx  Name Route Sig Dispense Refill  . ASPIRIN EC 81 MG PO TBEC Oral Take 81 mg by mouth daily.    Marland Kitchen VITAMIN D 1000 UNITS PO TABS Oral Take 1,000 Units by mouth daily.     . MOMETASONE FURO-FORMOTEROL FUM 200-5 MCG/ACT IN AERO Inhalation Inhale 2 puffs into  the lungs every 12 (twelve) hours.    . ADULT MULTIVITAMIN W/MINERALS CH Oral Take 1 tablet by mouth daily. Centrum Silver    . OVER THE COUNTER MEDICATION Oral Take 2 tablets by mouth every morning. Vitamin for eyes    . POTASSIUM CHLORIDE CRYS ER 20 MEQ PO TBCR Oral Take 20 mEq by mouth daily. With meals       BP 140/76  Pulse 71  Temp 98.2 F (36.8 C) (Oral)  Resp 18  SpO2 98%  Physical Exam  Nursing note and vitals reviewed. Constitutional: She is oriented to person, place, and time. She appears well-developed and well-nourished. No distress.  HENT:  Head: Normocephalic and atraumatic.  Eyes:       Normal appearance  Neck: Normal range of motion.  Cardiovascular: Normal rate, regular rhythm and intact distal pulses.   Pulmonary/Chest: Effort normal and breath sounds normal.  Musculoskeletal: Normal range of motion.       Mild tenderness at approx C6-C7 as well as entire right trap.  Full active ROM of neck and RUE but aggravates right posterior neck pain.    Neurological: She is alert and oriented to person, place, and time. She has normal reflexes. She displays no tremor. No  sensory deficit. She displays a negative Romberg sign. Coordination and gait normal.       CN 3-12 intact.  5/5 and equal upper and lower extremity strength.  No past pointing.  No pronator drift.  No nystagmus.   Skin: Skin is warm and dry. No rash noted.  Psychiatric: She has a normal mood and affect. Her behavior is normal.    ED Course  Procedures (including critical care time)  Labs Reviewed - No data to display No results found.   1. Cervical strain       MDM  71yo F presents w/ acute on chronic right posterior neck pain.  On exam, afebrile, mild lower cervical spine tenderness, but no worse than entire right trap, full ROM neck, no extremity weakness or NV deficits.  Suspect cervical strain and less likely, cervical radiculopathy.  Pt received IM toradol and I recommended rest and ice/heat  and NSAIDs.  She will f/u with her PCP if sx have not started to improve in 5-7 days.  Also referred to [facility]. Return precautions discussed.         Otilio Miu, [place] 07/21/12 [phone]

## 2012-07-21 ENCOUNTER — Other Ambulatory Visit (INDEPENDENT_AMBULATORY_CARE_PROVIDER_SITE_OTHER): Payer: Medicare Other | Admitting: Vascular Surgery

## 2012-07-21 ENCOUNTER — Ambulatory Visit (INDEPENDENT_AMBULATORY_CARE_PROVIDER_SITE_OTHER): Payer: Medicare Other | Admitting: Vascular Surgery

## 2012-07-21 ENCOUNTER — Encounter: Payer: Self-pay | Admitting: Vascular Surgery

## 2012-07-21 VITALS — BP 142/67 | HR 60 | Resp 16 | Ht 67.0 in | Wt 168.0 lb

## 2012-07-21 DIAGNOSIS — I6529 Occlusion and stenosis of unspecified carotid artery: Secondary | ICD-10-CM

## 2012-07-21 DIAGNOSIS — I6523 Occlusion and stenosis of bilateral carotid arteries: Secondary | ICD-10-CM | POA: Insufficient documentation

## 2012-07-21 DIAGNOSIS — G459 Transient cerebral ischemic attack, unspecified: Secondary | ICD-10-CM

## 2012-07-21 DIAGNOSIS — I658 Occlusion and stenosis of other precerebral arteries: Secondary | ICD-10-CM
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
## 2012-07-21 NOTE — ED Provider Notes (Signed)
Medical screening examination/treatment/procedure(s) were performed by non-physician practitioner and as supervising physician I was immediately available for consultation/collaboration.   Jayni Prescher, MD 07/21/12 2306 

## 2012-07-27 ENCOUNTER — Other Ambulatory Visit: Payer: Self-pay

## 2012-07-27 DIAGNOSIS — I6529 Occlusion and stenosis of unspecified carotid artery: Secondary | ICD-10-CM

## 2012-08-25 DIAGNOSIS — E7800 Pure hypercholesterolemia, unspecified: Secondary | ICD-10-CM | POA: Insufficient documentation

## 2012-08-25 DIAGNOSIS — H3530 Unspecified macular degeneration: Secondary | ICD-10-CM | POA: Insufficient documentation

## 2012-08-25 DIAGNOSIS — J45909 Unspecified asthma, uncomplicated: Secondary | ICD-10-CM | POA: Insufficient documentation

## 2012-10-02 ENCOUNTER — Encounter: Payer: Self-pay | Admitting: Internal Medicine

## 2012-10-02 ENCOUNTER — Ambulatory Visit (INDEPENDENT_AMBULATORY_CARE_PROVIDER_SITE_OTHER): Payer: Medicare Other | Admitting: Internal Medicine

## 2012-10-02 DIAGNOSIS — J45909 Unspecified asthma, uncomplicated: Secondary | ICD-10-CM

## 2012-10-02 DIAGNOSIS — D869 Sarcoidosis, unspecified: Secondary | ICD-10-CM

## 2012-10-02 MED ORDER — MOMETASONE FURO-FORMOTEROL FUM 100-5 MCG/ACT IN AERO: INHALATION_SPRAY | RESPIRATORY_TRACT | Status: DC

## 2012-10-02 MED ORDER — BECLOMETHASONE DIPROPIONATE 80 MCG/ACT IN AERS: INHALATION_SPRAY | RESPIRATORY_TRACT | Status: DC

## 2012-10-02 NOTE — Assessment & Plan Note (Signed)
-   PFT's 02/05/09  FEV1  1.59 (72%) ratio 66 and DLCO 63 corrects to 109    - hFa 90%10/02/2012     - PFT's 06/28/2012 FEV1  1.30 (61%) ratio 59 and DLCO 62 corrects to 113    - changed to dulera 200 2bid 06/28/2012 > throat irritation > rechallenge with 100 2 puffs qam   Has fixed component likely related to longterm sarcoid so rec trial of dulera 100  2 qm/ qvar 80 2 pm to see if can help with daytime doe s exac upper airway symptoms from ics  The proper method of use, as well as anticipated side effects, of a metered-dose inhaler are discussed and demonstrated to the patient. Improved effectiveness after extensive coaching during this visit to a level of approximately  90%

## 2012-10-02 NOTE — Patient Instructions (Addendum)
Change qvar to 80 take 2 pffs each pm  Start duler 100 2 puffss each am  Please schedule a follow up office visit in 6 weeks, call sooner if needed in the [facility]

## 2012-10-02 NOTE — Progress Notes (Signed)
Subjective:     Patient ID: Lisa [name], female   DOB: [birth year]    MRN: [id number]  HPI  10 yobf never smoker diagnosed with sarcoidosis with parenchymal and airway involvement in 1983 at [facility]. At one point she also had significant sinus involvement and had remained prednisone dependent since 1983 and finally  Stopped daily rx  without flare 06/2008   August 06, 2008 ov no prednisone x 1 month(inadvertently stopped without adverse effects) and no symptoms no am nausea or breathing no worse on servent and qvar    February 05, 2009 off serevent, using qvar 80 2 two times a day with no increase need for rescue vs while on serevent. rec leave off serevent    May 04, 2010 3 month followup. Pt states that her breathing has been okay as long as she does not stay outside in the hot weather for too long. She denies cough but c/o hoarsness x 1 wk. rec consider off biphosphonates   May 20, 2010 Acute visit. Pt c/o prod cough with yellow to green sputum x 4 days. She states "coughs all night long". She started biaxin 500 mg bid x 4 days ago with no improvement. c/o mouth allergies from foods, lost list of food allergies supplied by [facility] and wants f/u here.    June 03, 2010 -Presents for follow up and med review. Seen last visit w/ persistent cough tx w/ steroid taper. Says her cough is improved. Has upcoming visit with Dr. Sharyn Lull to evaluate for possible food allergies. We reveiwed her meds and updated her med calendar. She is feeling better more toward her baseline. She remains off bisphosphonates. rec no change rx, follow med calendar    08/06/11 Lisa [name]/ov no longer following med calendar  cc sob only  x steps, flat surface slt slower but goes anywhere she wants. No cough.  rec no change rx  11/15/11 Lisa [name] OV > overusing ventolin, asked to just take q 4 h prn and use otc's for pnds   02/14/2012 f/u ov/Lisa [name] cc all better, rarely needing maxair, given ventolin in ER ? Why if already had maxair,  misunderstood instructions though has med calendar she say she showed the er staff.   No cough ,  Breathing better p drinking cold but rarely limiting sob with exertion. rec See calendar for specific medication instructions and bring it back Only use your maxair as a rescue medication       06/28/2012 f/u ov/Lisa [name] no calendar cc doe x across the parking lot and requesting hc parking on qvar 2 bid- rec Stop qvar Start dulera 200 Take 2 puffs first thing in am and then another 2 puffs about 12 hours later.  Only use your albuterol (maxair)   10/02/2012 f/u ov/Lisa [name] cc breathing better but throat worse p one month of dulera 200 2 bid so changed back to qvar and lost some ground in terms of sob but throat better.  No obvious daytime variabilty or assoc chronic cough or cp or chest tightness, subjective wheeze overt sinus or hb symptoms. No unusual exp hx    Sleeping ok without nocturnal  or early am exacerbation  of respiratory  c/o's or need for noct saba. Also denies any obvious fluctuation of symptoms with weather or environmental changes or other aggravating or alleviating factors except as outlined above    ROS  The following are not active complaints unless bolded sore throat, dysphagia, dental problems, itching, sneezing,  nasal congestion or excess/ purulent  secretions, ear ache,   fever, chills, sweats, unintended wt loss, pleuritic or exertional cp, hemoptysis,  orthopnea pnd or leg swelling, presyncope, palpitations, heartburn, abdominal pain, anorexia, nausea, vomiting, diarrhea  or change in bowel or urinary habits, change in stools or urine, dysuria,hematuria,  rash, arthralgias, visual complaints, headache, numbness weakness or ataxia or problems with walking or coordination,  change in mood/affect or memory.            Allergies 1) ! Asa  2) ! Valium   Past Medical History:  SARCOIDOSIS (ICD-135)  - On prednisone 1983 ([facility]) > off 06/2008 (Lisa [name])  - PFT's 04/24/07 FEV1 1.62  (72%) ratio 63, no resp to Bronchodilators, DLC0 61% (on prednisone)  - PFT's 02/05/09 FEV1 1.59 (72%) ratio 66 no resp to bronchodilators DLC0 63 (off prednisone)  - HFA technique 100% February 05, 2009 with autohaler,  100% 08/06/2011  MVP syndrome...............................................................................................Marland Kitchen Dr. Lucas [name]  Osteopenia..  - DEXA 09/08/2009 T spine 1.2, L Fem Neck -1.6, RFem neck -1.9  Food allergies dx at [facility]  - Referred to [facility] for food allergies May 20, 2010         Objective:   Physical Exam  anxious but pleasant amb bf nad   wt 160 August 19, 2009 > 170 January 16, 2010 > 165 08/06/2011 > 02/14/2012  173 >  06/28/2012 164  > 174  10/02/2012   HEENT mild turbinate edema. Oropharynx no thrush or excess pnd or cobblestoning. No JVD or cervical adenopathy. Mild accessory muscle hypertrophy. Trachea midline, nl thryroid. Chest was hyperinflated by percussion with diminished breath sounds and moderate increased exp time without wheeze. Hoover sign positive at mid inspiration. Regular rate and rhythm without murmur gallop or rub or increase P2 or edema. Abd: no hsm, nl excursion. Ext warm without cyanosis or clubbing.   11/14/11 No change in evidence of granulomatous disease without focal acute  finding.     Assessment:         Plan:

## 2012-10-02 NOTE — Assessment & Plan Note (Signed)
-   On prednisone 1983 ([facility]) > off 06/2008 (Marlee Trentman)   No evidence of active sarcoidosis, pulmonary or systemic

## 2012-10-26 DIAGNOSIS — Z7689 Persons encountering health services in other specified circumstances: Secondary | ICD-10-CM | POA: Insufficient documentation

## 2012-11-06 ENCOUNTER — Ambulatory Visit (INDEPENDENT_AMBULATORY_CARE_PROVIDER_SITE_OTHER): Payer: Medicare Other | Admitting: Internal Medicine

## 2012-11-06 ENCOUNTER — Encounter: Payer: Self-pay | Admitting: Internal Medicine

## 2012-11-06 ENCOUNTER — Other Ambulatory Visit: Payer: Self-pay | Admitting: Internal Medicine

## 2012-11-06 VITALS — BP 144/70 | HR 62 | Temp 98.3°F | Ht 67.0 in | Wt 173.0 lb

## 2012-11-06 DIAGNOSIS — J45909 Unspecified asthma, uncomplicated: Secondary | ICD-10-CM

## 2012-11-06 MED ORDER — BECLOMETHASONE DIPROPIONATE 80 MCG/ACT IN AERS: 2.0000 | INHALATION_SPRAY | Freq: Two times a day (BID) | RESPIRATORY_TRACT | Status: DC

## 2012-11-06 NOTE — Patient Instructions (Addendum)
For cough or congestion use mucinex or mucinex dm max dose 1200 mg every 12 hours as needed   Please schedule a follow up visit in 3 months but call sooner if needed

## 2012-11-06 NOTE — Assessment & Plan Note (Addendum)
-   PFT's 02/05/09  FEV1  1.59 (72%) ratio 66 and DLCO 63 corrects to 109    - hFa 100%10/02/2012     - PFT's 06/28/2012 FEV1  1.30 (61%) ratio 59 and DLCO 62 corrects to 113    - changed to dulera 200 2bid 06/28/2012 > throat irritation > rechallenge with 100 2 puffs qam 10/02/12  > could not tolerate ? Suggesting a component of upper airway cough syndrome masquerading as asthma or asthamatic bronchitis and better on qvar likely related to less deposition in the upper airway.  Regardless, all goals of chronic asthma control met including optimal function and elimination of symptoms with minimal need for rescue therapy.  Contingencies discussed in full including contacting this office immediately if not controlling the symptoms using the rule of two's.       Each maintenance medication was reviewed in detail including most importantly the difference between maintenance and as needed and under what circumstances the prns are to be used.  Please see instructions for details which were reviewed in writing and the patient given a copy.

## 2012-11-06 NOTE — Progress Notes (Signed)
Subjective:     Patient ID: Lisa [name], female   DOB: Jan 16, 1941    MRN: [id number]  HPI  60 yobf never smoker diagnosed with sarcoidosis with parenchymal and airway involvement in 1983 at [facility]. At one point she also had significant sinus involvement and had remained prednisone dependent since 1983 and finally  Stopped daily rx  without flare 06/2008   August 06, 2008 ov no prednisone x 1 month(inadvertently stopped without adverse effects) and no symptoms no am nausea or breathing no worse on servent and qvar    February 05, 2009 off serevent, using qvar 80 2 two times a day with no increase need for rescue vs while on serevent. rec leave off serevent    May 04, 2010 3 month followup. Pt states that her breathing has been okay as long as she does not stay outside in the hot weather for too long. She denies cough but c/o hoarsness x 1 wk. rec consider off biphosphonates   May 20, 2010 Acute visit. Pt c/o prod cough with yellow to green sputum x 4 days. She states "coughs all night long". She started biaxin 500 mg bid x 4 days ago with no improvement. c/o mouth allergies from foods, lost list of food allergies supplied by [facility] and wants f/u here.    June 03, 2010 -Presents for follow up and med review. Seen last visit w/ persistent cough tx w/ steroid taper. Says her cough is improved. Has upcoming visit with Dr. Sharyn Lull to evaluate for possible food allergies. We reveiwed her meds and updated her med calendar. She is feeling better more toward her baseline. She remains off bisphosphonates. rec no change rx, follow med calendar    08/06/11 Wallice Granville/ov no longer following med calendar  cc sob only  x steps, flat surface slt slower but goes anywhere she wants. No cough.  rec no change rx  11/15/11 [place] OV > overusing ventolin, asked to just take q 4 h prn and use otc's for pnds   02/14/2012 f/u ov/Ginnette Gates cc all better, rarely needing maxair, given ventolin in ER ? Why if already had maxair,  misunderstood instructions though has med calendar she say she showed the er staff.   No cough ,  Breathing better p drinking cold but rarely limiting sob with exertion. rec See calendar for specific medication instructions and bring it back Only use your maxair as a rescue medication       06/28/2012 f/u ov/Ayan Heffington no calendar cc doe x across the parking lot and requesting hc parking on qvar 2 bid- rec Stop qvar Start dulera 200 Take 2 puffs first thing in am and then another 2 puffs about 12 hours later.  Only use your albuterol (maxair) as rescue   10/02/2012 f/u ov/Jodilyn Giese cc breathing better but throat worse p one month of dulera 200 2 bid so changed back to qvar and lost some ground in terms of sob but throat better. rec Change qvar to 80 take 2 pffs each pm Start dulera 100 2 puffss each am  11/06/2012 f/u ov/Jearlean Demauro cc sore throat on dulera, better p stopped it and changed to  qvar 80 2bid.  Has some developed also persistent variable daytime sense of chest congestion but no purulent sputum. No need for daytime saba   No obvious daytime variabilty or assoc  cp or chest tightness, subjective wheeze overt sinus or hb symptoms. No unusual exp hx .  Sleeping ok without nocturnal  or early am exacerbation  of respiratory  c/o's or need for noct saba. Also denies any obvious fluctuation of symptoms with weather or environmental changes or other aggravating or alleviating factors except as outlined above    ROS  The following are not active complaints unless bolded sore throat, dysphagia, dental problems, itching, sneezing,  nasal congestion or excess/ purulent secretions, ear ache,   fever, chills, sweats, unintended wt loss, pleuritic or exertional cp, hemoptysis,  orthopnea pnd or leg swelling, presyncope, palpitations, heartburn, abdominal pain, anorexia, nausea, vomiting, diarrhea  or change in bowel or urinary habits, change in stools or urine, dysuria,hematuria,  rash, arthralgias, visual  complaints, headache, numbness weakness or ataxia or problems with walking or coordination,  change in mood/affect or memory.            Allergies 1) ! Asa  2) ! Valium   Past Medical History:  SARCOIDOSIS (ICD-135)  - On prednisone 1983 ([facility]) > off 06/2008 (Giordana Weinheimer)  - PFT's 04/24/07 FEV1 1.62 (72%) ratio 63, no resp to Bronchodilators, DLC0 61% (on prednisone)  - PFT's 02/05/09 FEV1 1.59 (72%) ratio 66 no resp to bronchodilators DLC0 63 (off prednisone)  - HFA technique 100% February 05, 2009 with autohaler,  100% 08/06/2011  MVP syndrome...............................................................................................Marland Kitchen Dr. Lucas Mallow  Osteopenia..  - DEXA 09/08/2009 T spine 1.2, L Fem Neck -1.6, R Fem neck -1.9  Food allergies dx at [facility]  - Referred to [facility] for food allergies May 20, 2010         Objective:   Physical Exam  anxious but pleasant amb bf nad   wt 160 August 19, 2009 > 170 January 16, 2010 > 165 08/06/2011 > 02/14/2012  173 >  06/28/2012 164  > 174  10/02/2012 > 11/06/2012  173  HEENT mild turbinate edema. Oropharynx no thrush or excess pnd or cobblestoning. No JVD or cervical adenopathy. Mild accessory muscle hypertrophy. Trachea midline, nl thryroid. Chest was hyperinflated by percussion with diminished breath sounds and moderate increased exp time without wheeze. Hoover sign positive at mid inspiration. Regular rate and rhythm without murmur gallop or rub or increase P2 or edema. Abd: no hsm, nl excursion. Ext warm without cyanosis or clubbing.   11/14/11 No change in evidence of granulomatous disease without focal acute  finding.     Assessment:         Plan:

## 2012-11-07 ENCOUNTER — Telehealth: Payer: Self-pay | Admitting: Internal Medicine

## 2012-11-07 NOTE — Telephone Encounter (Signed)
Spoke with pharmacy clarified directions for the qvar Nothing further needed.

## 2012-12-04 ENCOUNTER — Ambulatory Visit: Payer: Medicare Other | Admitting: Internal Medicine

## 2013-03-07 DIAGNOSIS — R011 Cardiac murmur, unspecified: Secondary | ICD-10-CM | POA: Insufficient documentation

## 2013-03-30 DIAGNOSIS — I6529 Occlusion and stenosis of unspecified carotid artery: Secondary | ICD-10-CM | POA: Insufficient documentation

## 2013-04-04 ENCOUNTER — Ambulatory Visit (INDEPENDENT_AMBULATORY_CARE_PROVIDER_SITE_OTHER): Payer: Medicare Other | Admitting: Ophthalmology

## 2013-04-04 DIAGNOSIS — H3530 Unspecified macular degeneration: Secondary | ICD-10-CM

## 2013-04-04 DIAGNOSIS — H33309 Unspecified retinal break, unspecified eye: Secondary | ICD-10-CM

## 2013-04-04 DIAGNOSIS — H43819 Vitreous degeneration, unspecified eye: Secondary | ICD-10-CM

## 2013-04-04 DIAGNOSIS — H35379 Puckering of macula, unspecified eye: Secondary | ICD-10-CM

## 2013-05-22 DIAGNOSIS — K635 Polyp of colon: Secondary | ICD-10-CM

## 2013-05-22 HISTORY — PX: COLONOSCOPY: SHX174

## 2013-05-22 HISTORY — DX: Polyp of colon: K63.5

## 2013-06-05 DIAGNOSIS — R2240 Localized swelling, mass and lump, unspecified lower limb: Secondary | ICD-10-CM | POA: Insufficient documentation

## 2013-06-05 DIAGNOSIS — M19079 Primary osteoarthritis, unspecified ankle and foot: Secondary | ICD-10-CM | POA: Insufficient documentation

## 2013-07-04 DIAGNOSIS — IMO0002 Reserved for concepts with insufficient information to code with codable children: Secondary | ICD-10-CM | POA: Insufficient documentation

## 2013-07-04 DIAGNOSIS — H40019 Open angle with borderline findings, low risk, unspecified eye: Secondary | ICD-10-CM | POA: Insufficient documentation

## 2013-12-26 ENCOUNTER — Ambulatory Visit (INDEPENDENT_AMBULATORY_CARE_PROVIDER_SITE_OTHER): Payer: Medicare Other | Admitting: Internal Medicine

## 2013-12-26 ENCOUNTER — Encounter: Payer: Self-pay | Admitting: Internal Medicine

## 2013-12-26 VITALS — BP 116/80 | HR 60 | Temp 98.6°F | Ht 67.0 in | Wt 170.0 lb

## 2013-12-26 DIAGNOSIS — D869 Sarcoidosis, unspecified: Secondary | ICD-10-CM

## 2013-12-26 DIAGNOSIS — J45909 Unspecified asthma, uncomplicated: Secondary | ICD-10-CM

## 2013-12-26 MED ORDER — BECLOMETHASONE DIPROPIONATE 80 MCG/ACT IN AERS: 2.0000 | INHALATION_SPRAY | Freq: Two times a day (BID) | RESPIRATORY_TRACT | Status: DC

## 2013-12-26 NOTE — Assessment & Plan Note (Addendum)
-  PFT's 02/05/09  FEV1  1.59 (72%) ratio 66 and DLCO 63 corrects to 109    - hFa 100%10/02/2012     - PFT's 06/28/2012 FEV1  1.30 (61%) ratio 59 and DLCO 62 corrects to 113    - changed to dulera 200 2bid 06/28/2012 > throat irritation > rechallenge with 100 2 puffs qam 10/02/12  > could not tolerate> rx qvar 80 2bid  The proper method of use, as well as anticipated side effects, of a metered-dose inhaler are discussed and demonstrated to the patient. Improved effectiveness after extensive coaching during this visit to a level of approximately  90%   All goals of chronic asthma control met including optimal function and elimination of symptoms with minimal need for rescue therapy.  Contingencies discussed in full including contacting this office immediately if not controlling the symptoms using the rule of two's.

## 2013-12-26 NOTE — Assessment & Plan Note (Signed)
-   On prednisone 1983 ([facility]) > off 06/2008 (Siddarth Hsiung)   No evidence of sign dz activity other than possible airways involvement controlled with topical steroids in the form of qvar 80 2bid  Rec f/u cxr (ordered but not done)  F/u yearly, sooner prn

## 2013-12-26 NOTE — Patient Instructions (Addendum)
Please remember to go to the   x-ray department downstairs for your tests - we will call you with the results when they are available.  Please schedule a follow up visit in 12 months but call sooner if needed     

## 2013-12-26 NOTE — Progress Notes (Addendum)
Subjective:     Patient ID: Lisa [name], female   DOB: [birth year]    MRN: [id number]  HPI  40 yobf never smoker diagnosed with sarcoidosis with parenchymal and airway involvement in 1983 at [facility]. At one point she also had significant sinus involvement and had remained prednisone dependent since 1983 and finally  Stopped daily rx  without flare 06/2008     History of Present Illness    06/28/2012 f/u ov/Lisa [name] no calendar cc doe x across the parking lot and requesting hc parking on qvar 2 bid- rec Stop qvar Start dulera 200 Take 2 puffs first thing in am and then another 2 puffs about 12 hours later.  Only use your albuterol (maxair) as rescue   10/02/2012 f/u ov/Lisa [name] cc breathing better but throat worse p one month of dulera 200 2 bid so changed back to qvar and lost some ground in terms of sob but throat better. rec Change qvar to 80 take 2 pffs each pm Start dulera 100 2 puffss each am  11/06/2012 f/u ov/Lisa [name] cc sore throat on dulera, better p stopped it and changed to  qvar 80 2bid.  Has some developed also persistent variable daytime sense of chest congestion but no purulent sputum. No need for daytime saba  rec For cough or congestion use mucinex or mucinex dm max dose 1200 mg every 12 hours as needed    12/26/2013 f/u ov/Lisa [name] re: sarcoid f/u/ on qvar 80 2bid Chief Complaint  Patient presents with  . Follow-up    Pt states that her breathing is doing well. Has not used her rescue inhaler in the past couple of months.  Not limited by breathing from desired activities     No obvious daytime variabilty or assoc cough or  cp or chest tightness, subjective wheeze overt sinus or hb symptoms. No unusual exp hx .  Sleeping ok without nocturnal  or early am exacerbation  of respiratory  c/o's or need for noct saba. Also denies any obvious fluctuation of symptoms with weather or environmental changes or other aggravating or alleviating factors except as outlined above    ROS  The  following are not active complaints unless bolded sore throat, dysphagia, dental problems, itching, sneezing,  nasal congestion or excess/ purulent secretions, ear ache,   fever, chills, sweats, unintended wt loss, pleuritic or exertional cp, hemoptysis,  orthopnea pnd or leg swelling, presyncope, palpitations, heartburn, abdominal pain, anorexia, nausea, vomiting, diarrhea  or change in bowel or urinary habits, change in stools or urine, dysuria,hematuria,  rash, arthralgias, visual complaints, headache, numbness weakness or ataxia or problems with walking or coordination,  change in mood/affect or memory.            Allergies 1) ! Asa  2) ! Valium   Past Medical History:  SARCOIDOSIS (ICD-135)  - On prednisone 1983 ([facility]) > off 06/2008 (Lisa [name])  - PFT's 04/24/07 FEV1 1.62 (72%) ratio 63, no resp to Bronchodilators, DLC0 61% (on prednisone)  - PFT's 02/05/09 FEV1 1.59 (72%) ratio 66 no resp to bronchodilators DLC0 63 (off prednisone)  - HFA technique 100% February 05, 2009 with autohaler,  100% 08/06/2011  MVP syndrome...............................................................................................Marland Kitchen Dr. Pauline [name]  Osteopenia..  - DEXA 09/08/2009 T spine 1.2, L Fem Neck -1.6, R Fem neck -1.9  Food allergies dx at [facility]  - Referred to [facility] for food allergies May 20, 2010         Objective:   Physical Exam  anxious but pleasant amb bf nad  wt 160 August 19, 2009 > 170 January 16, 2010 > 165 08/06/2011 > 02/14/2012  173 >  06/28/2012 164  > 174  10/02/2012 > 11/06/2012  173 > 12/26/2013 170   HEENT mild turbinate edema. Oropharynx no thrush or excess pnd or cobblestoning. No JVD or cervical adenopathy. Mild accessory muscle hypertrophy. Trachea midline, nl thryroid. Chest was hyperinflated by percussion with diminished breath sounds and moderate increased exp time without wheeze. Hoover sign positive at mid inspiration. Regular rate and rhythm  II-III/VI SEM  without   gallop or rub or  increase P2 or edema. Abd: no hsm, nl excursion. Ext warm without cyanosis or clubbing.     cxr 12/27/13  Chronic parenchymal changes in the areas of lymph node calcification consistent with patient's history of sarcoidosis. Scarring versus small effusion right lung base. No focal regions of consolidation or focal infiltrates.          Assessment:

## 2013-12-27 ENCOUNTER — Telehealth: Payer: Self-pay | Admitting: *Deleted

## 2013-12-27 ENCOUNTER — Ambulatory Visit (INDEPENDENT_AMBULATORY_CARE_PROVIDER_SITE_OTHER)
Admission: RE | Admit: 2013-12-27 | Discharge: 2013-12-27 | Disposition: A | Discharge status: Home / Self Care | Payer: Medicare Other | Source: Ambulatory Visit | Attending: Internal Medicine | Admitting: Internal Medicine

## 2013-12-27 DIAGNOSIS — D869 Sarcoidosis, unspecified: Secondary | ICD-10-CM

## 2013-12-27 NOTE — Telephone Encounter (Signed)
Spoke with the pt  She states that she will come back next wk for cxr to be done  I advised we will call her for results once done

## 2013-12-27 NOTE — Telephone Encounter (Signed)
Message copied by Rosana Berger on Thu Dec 27, 2013  9:16 AM ------      Message from: Lisa [name]      Created: Wed Dec 26, 2013  8:27 PM       Did not go for cxr - see if she would return at her convenience ------

## 2013-12-27 NOTE — Progress Notes (Signed)
Quick Note:  Spoke with pt and notified of results per Dr. Wert. Pt verbalized understanding and denied any questions.  ______ 

## 2013-12-31 ENCOUNTER — Telehealth: Payer: Self-pay | Admitting: Internal Medicine

## 2013-12-31 MED ORDER — BECLOMETHASONE DIPROPIONATE 80 MCG/ACT IN AERS: 2.0000 | INHALATION_SPRAY | Freq: Two times a day (BID) | RESPIRATORY_TRACT | Status: DC

## 2013-12-31 NOTE — Telephone Encounter (Signed)
Spoke with pt's daughter. [facility] has called the pt several times about information needed about her Qvar prescription.  I called [facility], they had questions about the sig. There were 2 different ones attached to the prescription. They request a new rx with the correct sig be sent to them. This has been fixed and resent. Nothing further was needed.

## 2014-01-25 DIAGNOSIS — M179 Osteoarthritis of knee, unspecified: Secondary | ICD-10-CM | POA: Insufficient documentation

## 2014-01-25 DIAGNOSIS — M1710 Unilateral primary osteoarthritis, unspecified knee: Secondary | ICD-10-CM | POA: Insufficient documentation

## 2014-03-21 ENCOUNTER — Encounter (HOSPITAL_COMMUNITY): Payer: Self-pay | Admitting: Emergency Medicine

## 2014-03-21 ENCOUNTER — Emergency Department (HOSPITAL_COMMUNITY)
Admission: EM | Admit: 2014-03-21 | Discharge: 2014-03-21 | Discharge status: Against Medical Advice | Payer: Medicare Other | Attending: Emergency Medicine | Admitting: Emergency Medicine

## 2014-03-21 DIAGNOSIS — R51 Headache: Secondary | ICD-10-CM | POA: Insufficient documentation

## 2014-03-21 DIAGNOSIS — J45909 Unspecified asthma, uncomplicated: Secondary | ICD-10-CM | POA: Insufficient documentation

## 2014-03-21 DIAGNOSIS — R110 Nausea: Secondary | ICD-10-CM | POA: Insufficient documentation

## 2014-03-21 NOTE — ED Notes (Signed)
Pt states she is leaving due to wait, encouraged to stay 

## 2014-03-21 NOTE — ED Notes (Signed)
Pt reports having a pain to top of her head x 2 weeks with mild nausea, no vomiting.

## 2014-03-22 ENCOUNTER — Ambulatory Visit: Payer: Self-pay | Admitting: Family Medicine

## 2014-03-25 DIAGNOSIS — E876 Hypokalemia: Secondary | ICD-10-CM | POA: Insufficient documentation

## 2014-04-04 ENCOUNTER — Ambulatory Visit (INDEPENDENT_AMBULATORY_CARE_PROVIDER_SITE_OTHER): Payer: Medicare Other | Admitting: Ophthalmology

## 2014-04-10 ENCOUNTER — Ambulatory Visit (INDEPENDENT_AMBULATORY_CARE_PROVIDER_SITE_OTHER): Payer: Medicare Other | Admitting: Ophthalmology

## 2014-04-10 DIAGNOSIS — M545 Low back pain, unspecified: Secondary | ICD-10-CM | POA: Insufficient documentation

## 2014-04-10 DIAGNOSIS — M542 Cervicalgia: Secondary | ICD-10-CM | POA: Insufficient documentation

## 2014-04-10 DIAGNOSIS — R519 Headache, unspecified: Secondary | ICD-10-CM | POA: Insufficient documentation

## 2014-04-10 DIAGNOSIS — R51 Headache: Secondary | ICD-10-CM

## 2014-04-20 ENCOUNTER — Emergency Department: Payer: Self-pay | Admitting: Emergency Medicine

## 2014-05-17 DIAGNOSIS — T148XXA Other injury of unspecified body region, initial encounter: Secondary | ICD-10-CM | POA: Insufficient documentation

## 2014-05-24 ENCOUNTER — Encounter: Payer: Self-pay | Admitting: Internal Medicine

## 2014-05-24 ENCOUNTER — Encounter (INDEPENDENT_AMBULATORY_CARE_PROVIDER_SITE_OTHER): Payer: Self-pay

## 2014-05-24 ENCOUNTER — Ambulatory Visit (INDEPENDENT_AMBULATORY_CARE_PROVIDER_SITE_OTHER): Payer: Medicare Other | Admitting: Internal Medicine

## 2014-05-24 VITALS — BP 124/80 | HR 93 | Temp 98.0°F | Ht 65.25 in | Wt 166.8 lb

## 2014-05-24 DIAGNOSIS — J45909 Unspecified asthma, uncomplicated: Secondary | ICD-10-CM

## 2014-05-24 DIAGNOSIS — J4530 Mild persistent asthma, uncomplicated: Secondary | ICD-10-CM

## 2014-05-24 DIAGNOSIS — D869 Sarcoidosis, unspecified: Secondary | ICD-10-CM

## 2014-05-24 NOTE — Progress Notes (Signed)
Subjective:     Patient ID: Lisa [name], female   DOB: June 17, 1941    MRN: [id number]    Brief patient profile:  44 yobf never smoker diagnosed with sarcoidosis with parenchymal and airway involvement in 1983 at [facility]. At one point she also had significant sinus involvement and had remained prednisone dependent since 1983 and finally  Stopped daily rx  without flare 06/2008     History of Present Illness   06/28/2012 f/u ov/Verlan Grotz no calendar cc doe x across the parking lot and requesting hc parking on qvar 2 bid- rec Stop qvar Start dulera 200 Take 2 puffs first thing in am and then another 2 puffs about 12 hours later.  Only use your albuterol (maxair) as rescue   10/02/2012 f/u ov/Mekhi Sonn cc breathing better but throat worse p one month of dulera 200 2 bid so changed back to qvar and lost some ground in terms of sob but throat better. rec Change qvar to 80 take 2 pffs each pm Start dulera 100 2 puffss each am  11/06/2012 f/u ov/Olinda Nola cc sore throat on dulera, better p stopped it and changed to  qvar 80 2bid.  Has some developed also persistent variable daytime sense of chest congestion but no purulent sputum. No need for daytime saba  rec For cough or congestion use mucinex or mucinex dm max dose 1200 mg every 12 hours as needed    12/26/2013 f/u ov/Dollie Bressi re: sarcoid f/u/ on qvar 80 2bid Chief Complaint  Patient presents with  . Follow-up    Pt states that her breathing is doing well. Has not used her rescue inhaler in the past couple of months.  Not limited by breathing from desired activities   rec No change rx   05/24/2014 f/u ov/Dekker Verga re: sarcoid/asthma  Chief Complaint  Patient presents with  . Follow-up    Pt states that her breathing is doing well.  Seldom needs rescue inhaler. No new co's today.     No chronic prednisone needed Not limited by breathing from desired activities    No obvious daytime variabilty or assoc cough or  cp or chest tightness, subjective wheeze overt  sinus or hb symptoms. No unusual exp hx .  Sleeping ok without nocturnal  or early am exacerbation  of respiratory  c/o's or need for noct saba. Also denies any obvious fluctuation of symptoms with weather or environmental changes or other aggravating or alleviating factors except as outlined above    ROS  The following are not active complaints unless bolded sore throat, dysphagia, dental problems, itching, sneezing,  nasal congestion or excess/ purulent secretions, ear ache,   fever, chills, sweats, unintended wt loss, pleuritic or exertional cp, hemoptysis,  orthopnea pnd or leg swelling, presyncope, palpitations, heartburn, abdominal pain, anorexia, nausea, vomiting, diarrhea  or change in bowel or urinary habits, change in stools or urine, dysuria,hematuria,  rash, arthralgias, visual complaints, headache, numbness weakness or ataxia or problems with walking or coordination,  change in mood/affect or memory.            Allergies 1) ! Asa  2) ! Valium   Past Medical History:  SARCOIDOSIS (ICD-135)  - On prednisone 1983 ([facility]) > off 06/2008 (Joei Frangos)  - PFT's 04/24/07 FEV1 1.62 (72%) ratio 63, no resp to Bronchodilators, DLC0 61% (on prednisone)  - PFT's 02/05/09 FEV1 1.59 (72%) ratio 66 no resp to bronchodilators DLC0 63 (off prednisone)  - HFA technique 100% February 05, 2009 with autohaler,  100%  08/06/2011  MVP syndrome...............................................................................................Marland Kitchen Dr. Pauline Aus  Osteopenia..  - DEXA 09/08/2009 T spine 1.2, L Fem Neck -1.6, R Fem neck -1.9  Food allergies dx at [facility]  - Referred to [facility] for food allergies May 20, 2010         Objective:   Physical Exam  anxious but pleasant amb bf nad   wt 160 August 19, 2009 > 170 January 16, 2010 > 165 08/06/2011 > 02/14/2012  173 >  06/28/2012 164  > 174  10/02/2012 > 11/06/2012  173 > 12/26/2013 170 > 05/24/2014 167   HEENT mild turbinate edema. Oropharynx no thrush or excess pnd or  cobblestoning. No JVD or cervical adenopathy. Mild accessory muscle hypertrophy. Trachea midline, nl thryroid. Chest was hyperinflated by percussion with diminished breath sounds and moderate increased exp time without wheeze. Hoover sign positive at mid inspiration. Regular rate and rhythm  II-III/VI SEM  without   gallop or rub or increase P2 or edema. Abd: no hsm, nl excursion. Ext warm without cyanosis or clubbing.     cxr 12/27/13  Chronic parenchymal changes in the areas of lymph node calcification consistent with patient's history of sarcoidosis. Scarring versus small effusion right lung base. No focal regions of consolidation or focal infiltrates.          Assessment:

## 2014-05-24 NOTE — Patient Instructions (Signed)
No change in medications   Please schedule a follow up visit in 12  months but call sooner if needed  

## 2014-05-25 NOTE — Assessment & Plan Note (Signed)
-   PFT's 02/05/09  FEV1  1.59 (72%) ratio 66 and DLCO 63 corrects to 109    - hFa 100%10/02/2012     - PFT's 06/28/2012 FEV1  1.30 (61%) ratio 59 and DLCO 62 corrects to 113    - changed to dulera 200 2bid 06/28/2012 > throat irritation > rechallenge with 100 2 puffs qam 10/02/12  > could not tolerate> rx qvar 80 2bid    - 12/26/2013 p extensive coaching HFA effectiveness =  100%  Adequate control on present rx, reviewed > no change in rx needed      Each maintenance medication was reviewed in detail including most importantly the difference between maintenance and as needed and under what circumstances the prns are to be used.  Please see instructions for details which were reviewed in writing and the patient given a copy.

## 2014-05-25 NOTE — Assessment & Plan Note (Signed)
-   prednisone Rx 1983 ([facility]) > d/c  06/2008 (Ela Moffat)   No evidence of pulmonary or systemic activity

## 2014-08-16 DIAGNOSIS — M170 Bilateral primary osteoarthritis of knee: Secondary | ICD-10-CM | POA: Insufficient documentation

## 2014-09-12 DIAGNOSIS — R55 Syncope and collapse: Secondary | ICD-10-CM | POA: Insufficient documentation

## 2015-01-28 ENCOUNTER — Other Ambulatory Visit: Payer: Self-pay | Admitting: Internal Medicine

## 2015-01-28 MED ORDER — BECLOMETHASONE DIPROPIONATE 80 MCG/ACT IN AERS: 2.0000 | INHALATION_SPRAY | Freq: Two times a day (BID) | RESPIRATORY_TRACT | Status: DC

## 2015-04-21 ENCOUNTER — Other Ambulatory Visit: Payer: Self-pay

## 2015-05-26 ENCOUNTER — Telehealth: Payer: Self-pay | Admitting: Internal Medicine

## 2015-05-26 NOTE — Telephone Encounter (Signed)
Called and spoke to pt. Pt stated she is having a persistent dry cough for 1 week. Pt denies any SOB, discoloration of mucus, CP/tightness. Advised pt to keep appt with MW on 8.9.16. Pt verbalized understanding and denied any further questions or concerns at this time.

## 2015-06-03 ENCOUNTER — Ambulatory Visit (INDEPENDENT_AMBULATORY_CARE_PROVIDER_SITE_OTHER)
Admission: RE | Admit: 2015-06-03 | Discharge: 2015-06-03 | Disposition: A | Discharge status: Home / Self Care | Payer: Medicare Other | Source: Ambulatory Visit | Attending: Internal Medicine | Admitting: Internal Medicine

## 2015-06-03 ENCOUNTER — Encounter: Payer: Self-pay | Admitting: Internal Medicine

## 2015-06-03 ENCOUNTER — Ambulatory Visit (INDEPENDENT_AMBULATORY_CARE_PROVIDER_SITE_OTHER): Payer: Medicare Other | Admitting: Internal Medicine

## 2015-06-03 VITALS — BP 114/62 | HR 75 | Ht 67.0 in | Wt 169.0 lb

## 2015-06-03 DIAGNOSIS — J310 Chronic rhinitis: Secondary | ICD-10-CM

## 2015-06-03 DIAGNOSIS — J4530 Mild persistent asthma, uncomplicated: Secondary | ICD-10-CM | POA: Diagnosis not present

## 2015-06-03 DIAGNOSIS — D869 Sarcoidosis, unspecified: Secondary | ICD-10-CM

## 2015-06-03 MED ORDER — AZITHROMYCIN 250 MG PO TABS: ORAL_TABLET | ORAL | Status: DC

## 2015-06-03 NOTE — Progress Notes (Signed)
Quick Note:  Spoke with pt and notified of results per Dr. Wert. Pt verbalized understanding and denied any questions.  ______ 

## 2015-06-03 NOTE — Patient Instructions (Addendum)
zpak called in   Try mucinex D instead of the dm as needed for nasal congestion  Please remember to go to the x-ray department downstairs for your tests - we will call you with the results when they are available.  Follow up yearly to check on your sarcoid,  Sooner if needed.

## 2015-06-03 NOTE — Progress Notes (Signed)
Subjective:     Patient ID: Lisa [name], female   DOB: [birth year]    MRN: [id number]    Brief patient profile:  76 yobf never smoker diagnosed with sarcoidosis with parenchymal and airway involvement in 1983 at [facility]. At one point she also had significant sinus involvement and had remained prednisone dependent since 1983 and finally  Stopped daily prednisone  06/2008     History of Present Illness    05/24/2014 f/u ov/Lisa [name] re: sarcoid/asthma  Chief Complaint  Patient presents with  . Follow-up    Pt states that her breathing is doing well.  Seldom needs rescue inhaler. No new co's today.   No chronic prednisone needed Not limited by breathing from desired activities   rec No change rx   06/03/2015 f/u ov/Lisa [name] re: sarcoidosis/ acute nasal congestion x 2weeks  maint rx qvar 80 2bid / no need for saba  Chief Complaint  Patient presents with  . Follow-up    Pt c/o nasal congestion for the past 2 wks. Her breathing is overall doing well.   abrupt onset with white mucus/ nothing purulent/ no cough or wheeze or sob - no response to mucinex dm     No obvious daytime variabilty or assoc   cp or chest tightness, subjective wheeze overt   hb symptoms. No unusual exp hx .  Sleeping ok without nocturnal  or early am exacerbation  of respiratory  c/o's or need for noct saba. Also denies any obvious fluctuation of symptoms with weather or environmental changes or other aggravating or alleviating factors except as outlined above    ROS  The following are not active complaints unless bolded sore throat, dysphagia, dental problems, itching, sneezing,  nasal congestion or excess/ purulent secretions, ear ache,   fever, chills, sweats, unintended wt loss, pleuritic or exertional cp, hemoptysis,  orthopnea pnd or leg swelling, presyncope, palpitations, heartburn, abdominal pain, anorexia, nausea, vomiting, diarrhea  or change in bowel or urinary habits, change in stools or urine, dysuria,hematuria,  rash,  arthralgias, visual complaints, headache, numbness weakness or ataxia or problems with walking or coordination,  change in mood/affect or memory.              Past Medical History:  SARCOIDOSIS (ICD-135)  - On prednisone 1983 ([facility]) > off 06/2008 (Donnis Pecha)  - PFT's 04/24/07 FEV1 1.62 (72%) ratio 63, no resp to Bronchodilators, DLC0 61% (on prednisone)  - PFT's 02/05/09 FEV1 1.59 (72%) ratio 66 no resp to bronchodilators DLC0 63 (off prednisone)  - HFA technique 100% February 05, 2009 with autohaler,  100% 08/06/2011  MVP syndrome...............................................................................................Marland Kitchen Dr. Pauline Aus  Osteopenia..  - DEXA 09/08/2009 T spine 1.2, L Fem Neck -1.6, R Fem neck -1.9  Food allergies dx at [facility]  - Referred to [facility] for food allergies May 20, 2010         Objective:   Physical Exam   anxious but pleasant amb bf nad   wt 160 August 19, 2009 > 170 January 16, 2010 > 165 08/06/2011 > 02/14/2012  173 >  06/28/2012 164  > 174  10/02/2012 > 11/06/2012  173 > 12/26/2013 170 > 05/24/2014 167 > 06/03/2015   169   HEENT mild turbinate edema. Oropharynx no thrush or excess pnd or cobblestoning. No JVD or cervical adenopathy. Mild accessory muscle hypertrophy. Trachea midline, nl thryroid. Chest was hyperinflated by percussion with diminished breath sounds and moderate increased exp time without wheeze. Hoover sign positive at mid inspiration. Regular rate and rhythm  II-III/VI SEM  without   gallop or rub or increase P2 or edema. Abd: no hsm, nl excursion. Ext warm without cyanosis or clubbing.         CXR PA and Lateral:   06/03/2015 :     I personally reviewed images and agree with radiology impression as follows:    Stable calcified mediastinal and hilar lymph nodes. Stable bilateral pleural parenchymal scarring. Findings consistent patient's known sarcoidosis. No acute abnormality .      Assessment:

## 2015-06-04 ENCOUNTER — Encounter: Payer: Self-pay | Admitting: Internal Medicine

## 2015-06-04 DIAGNOSIS — J310 Chronic rhinitis: Secondary | ICD-10-CM | POA: Insufficient documentation

## 2015-06-04 NOTE — Assessment & Plan Note (Signed)
-   PFT's 02/05/09  FEV1  1.59 (72%) ratio 66 and DLCO 63 corrects to 109    - PFT's 06/28/2012 FEV1  1.30 (61%) ratio 59 and DLCO 62 corrects to 113    - changed to dulera 200 2bid 06/28/2012 > throat irritation > rechallenge with 100 2 puffs qam 10/02/12  > could not tolerate> rx qvar 80 2bid    - 12/26/2013 p extensive coaching HFA effectiveness =  100%    Adequate control on present rx, reviewed > no change in rx needed

## 2015-06-04 NOTE — Assessment & Plan Note (Signed)
-   prednisone Rx 1983 ([facility]) > d/c  06/2008 (Addilynn Mowrer)   No evidence of active sarcoid

## 2015-06-04 NOTE — Assessment & Plan Note (Addendum)
Acute flare non -specific features unlikely to represent sarcoid > try mucinex d prn plus zpak at pts request though I don't think this is infection   I had an extended discussion with the patient reviewing all relevant studies completed to date and  lasting 15 to 20 minutes of a 25 minute visit    Each maintenance medication was reviewed in detail including most importantly the difference between maintenance and prns and under what circumstances the prns are to be triggered using an action plan format that is not reflected in the computer generated alphabetically organized AVS.    Please see instructions for details which were reviewed in writing and the patient given a copy highlighting the part that I personally wrote and discussed at today's ov.

## 2015-07-15 ENCOUNTER — Encounter
Admission: RE | Admit: 2015-07-15 | Discharge: 2015-07-15 | Disposition: A | Discharge status: Home / Self Care | Payer: Medicare Other | Source: Ambulatory Visit | Attending: Surgery | Admitting: Surgery

## 2015-07-15 ENCOUNTER — Other Ambulatory Visit: Payer: Medicare Other

## 2015-07-15 DIAGNOSIS — Z01810 Encounter for preprocedural cardiovascular examination: Secondary | ICD-10-CM | POA: Insufficient documentation

## 2015-07-15 DIAGNOSIS — Z01812 Encounter for preprocedural laboratory examination: Secondary | ICD-10-CM | POA: Insufficient documentation

## 2015-07-15 HISTORY — DX: Headache, unspecified: R51.9

## 2015-07-15 HISTORY — DX: Cardiac murmur, unspecified: R01.1

## 2015-07-15 HISTORY — DX: Headache: R51

## 2015-07-15 HISTORY — DX: Unspecified glaucoma: H40.9

## 2015-07-15 HISTORY — DX: Reserved for inherently not codable concepts without codable children: IMO0001

## 2015-07-15 HISTORY — DX: Unspecified osteoarthritis, unspecified site: M19.90

## 2015-07-15 HISTORY — DX: Hyperlipidemia, unspecified: E78.5

## 2015-07-15 LAB — DIFFERENTIAL
BASOS PCT: 1 %
Basophils Absolute: 0 10*3/uL (ref 0–0.1)
EOS PCT: 1 %
Eosinophils Absolute: 0 10*3/uL (ref 0–0.7)
Lymphocytes Relative: 15 %
Lymphs Abs: 1 10*3/uL (ref 1.0–3.6)
MONO ABS: 0.6 10*3/uL (ref 0.2–0.9)
MONOS PCT: 10 %
NEUTROS ABS: 4.9 10*3/uL (ref 1.4–6.5)
Neutrophils Relative %: 73 %

## 2015-07-15 LAB — CBC
HEMATOCRIT: 40.3 % (ref 35.0–47.0)
Hemoglobin: 13.3 g/dL (ref 12.0–16.0)
MCH: 31.7 pg (ref 26.0–34.0)
MCHC: 33 g/dL (ref 32.0–36.0)
MCV: 96.2 fL (ref 80.0–100.0)
PLATELETS: 306 10*3/uL (ref 150–440)
RBC: 4.19 MIL/uL (ref 3.80–5.20)
RDW: 13.7 % (ref 11.5–14.5)
WBC: 6.6 10*3/uL (ref 3.6–11.0)

## 2015-07-15 LAB — COMPREHENSIVE METABOLIC PANEL
ALBUMIN: 4 g/dL (ref 3.5–5.0)
ALK PHOS: 69 U/L (ref 38–126)
ALT: 17 U/L (ref 14–54)
ANION GAP: 9 (ref 5–15)
AST: 18 U/L (ref 15–41)
BILIRUBIN TOTAL: 0.5 mg/dL (ref 0.3–1.2)
BUN: 15 mg/dL (ref 6–20)
CALCIUM: 10.1 mg/dL (ref 8.9–10.3)
CO2: 31 mmol/L (ref 22–32)
Chloride: 101 mmol/L (ref 101–111)
Creatinine, Ser: 0.63 mg/dL (ref 0.44–1.00)
GFR calc Af Amer: >60 mL/min (ref >60)
GLUCOSE: 92 mg/dL (ref 65–99)
Potassium: 3.9 mmol/L (ref 3.5–5.1)
Sodium: 141 mmol/L (ref 135–145)
TOTAL PROTEIN: 7.4 g/dL (ref 6.5–8.1)

## 2015-07-15 LAB — PROTIME-INR
INR: 0.93
Prothrombin Time: 12.7 seconds (ref 11.4–15.0)

## 2015-07-15 NOTE — Patient Instructions (Signed)
  Your procedure is scheduled on: July 24, 2015 (Thursday) Report to Day Surgery.[facility]) Second Floor  To find out your arrival time please call [phone] between 1PM - 3PM on September 28,2016 (Wednesday).  Remember: Instructions that are not followed completely may result in serious medical risk, up to and including death, or upon the discretion of your surgeon and anesthesiologist your surgery may need to be rescheduled.    __x__ 1. Do not eat food or drink liquids after midnight. No gum chewing or hard candies.     ____ 2. No Alcohol for 24 hours before or after surgery.   ____ 3. Bring all medications with you on the day of surgery if instructed.    __x__ 4. Notify your doctor if there is any change in your medical condition     (cold, fever, infections).     Do not wear jewelry, make-up, hairpins, clips or nail polish.  Do not wear lotions, powders, or perfumes. You may wear deodorant.  Do not shave 48 hours prior to surgery. Men may shave face and neck.  Do not bring valuables to the hospital.    [facility] is not responsible for any belongings or valuables.               Contacts, dentures or bridgework may not be worn into surgery.  Leave your suitcase in the car. After surgery it may be brought to your room.  For patients admitted to the hospital, discharge time is determined by your                treatment team.   Patients discharged the day of surgery will not be allowed to drive home.   Please read over the following fact sheets that you were given:      __x__ Take these medicines the morning of surgery with A SIP OF WATER:    1. Crestor  2. Magnesium Oxide  3.   4.  5.  6.  ____ Fleet Enema (as directed)   ____ Use CHG Soap as directed  __x__ Use inhalers on the day of surgery (Use QVAR and MAXAIR inhalers day of surgery and bring to hospitall)  ____ Stop metformin 2 days prior to surgery    ____ Take 1/2 of usual insulin dose the night  before surgery and none on the morning of surgery.   __x__ Stop Coumadin/Plavix/aspirin on (Stop Aspirin one week before surgery)____ Stop Anti-inflammatories on    ____ Stop supplements until after surgery.    ____ Bring C-Pap to the hospital.

## 2015-07-15 NOTE — Pre-Procedure Instructions (Signed)
EKG to anesthesia for review. 

## 2015-07-17 NOTE — Pre-Procedure Instructions (Signed)
Dr. Rochel Brome office notified of medical clearance request from Dr. Norman Clay.

## 2015-07-17 NOTE — Pre-Procedure Instructions (Signed)
Medical Clearance requested by Dr. Marcello Moores, due to pre-op EKG. Clearance faxed to Dr. Norman Clay at [facility] [place], and spoke to [place].

## 2015-07-22 NOTE — OR Nursing (Signed)
Sherry at Dr Nicholes Stairs office notified patient not cleared by pcp because of chest pain and being referred to cardiology

## 2015-07-22 NOTE — Pre-Procedure Instructions (Signed)
Re faxed request for medical clearance to Dr Norman Clay.

## 2015-07-22 NOTE — Pre-Procedure Instructions (Signed)
Called Dr Thompson Caul office inquiring concerning medical clearance.  Nurse to call back.

## 2015-07-23 DIAGNOSIS — R079 Chest pain, unspecified: Secondary | ICD-10-CM | POA: Insufficient documentation

## 2015-07-24 ENCOUNTER — Ambulatory Visit: Payer: Medicare Other | Admitting: Anesthesiology

## 2015-07-24 ENCOUNTER — Encounter
Admission: RE | Disposition: A | Discharge status: Home / Self Care | Payer: Self-pay | Source: Ambulatory Visit | Attending: Surgery

## 2015-07-24 ENCOUNTER — Ambulatory Visit: Admission: RE | Admit: 2015-07-24 | Payer: Medicare Other | Source: Ambulatory Visit | Admitting: Surgery

## 2015-07-24 ENCOUNTER — Encounter: Payer: Self-pay | Admitting: Anesthesiology

## 2015-07-24 ENCOUNTER — Encounter: Admission: RE | Payer: Self-pay | Source: Ambulatory Visit

## 2015-07-24 ENCOUNTER — Ambulatory Visit
Admission: RE | Admit: 2015-07-24 | Discharge: 2015-07-24 | Disposition: A | Discharge status: Home / Self Care | Payer: Medicare Other | Source: Ambulatory Visit | Attending: Surgery | Admitting: Surgery

## 2015-07-24 DIAGNOSIS — Z7982 Long term (current) use of aspirin: Secondary | ICD-10-CM | POA: Insufficient documentation

## 2015-07-24 DIAGNOSIS — E785 Hyperlipidemia, unspecified: Secondary | ICD-10-CM | POA: Diagnosis not present

## 2015-07-24 DIAGNOSIS — K648 Other hemorrhoids: Secondary | ICD-10-CM | POA: Diagnosis present

## 2015-07-24 DIAGNOSIS — Z9842 Cataract extraction status, left eye: Secondary | ICD-10-CM | POA: Insufficient documentation

## 2015-07-24 DIAGNOSIS — D869 Sarcoidosis, unspecified: Secondary | ICD-10-CM | POA: Diagnosis not present

## 2015-07-24 DIAGNOSIS — Z888 Allergy status to other drugs, medicaments and biological substances status: Secondary | ICD-10-CM | POA: Insufficient documentation

## 2015-07-24 DIAGNOSIS — K644 Residual hemorrhoidal skin tags: Secondary | ICD-10-CM | POA: Insufficient documentation

## 2015-07-24 DIAGNOSIS — Z825 Family history of asthma and other chronic lower respiratory diseases: Secondary | ICD-10-CM | POA: Diagnosis not present

## 2015-07-24 DIAGNOSIS — Z79899 Other long term (current) drug therapy: Secondary | ICD-10-CM | POA: Insufficient documentation

## 2015-07-24 DIAGNOSIS — H409 Unspecified glaucoma: Secondary | ICD-10-CM | POA: Diagnosis not present

## 2015-07-24 DIAGNOSIS — Z8042 Family history of malignant neoplasm of prostate: Secondary | ICD-10-CM | POA: Diagnosis not present

## 2015-07-24 DIAGNOSIS — Z8249 Family history of ischemic heart disease and other diseases of the circulatory system: Secondary | ICD-10-CM | POA: Diagnosis not present

## 2015-07-24 DIAGNOSIS — J45909 Unspecified asthma, uncomplicated: Secondary | ICD-10-CM | POA: Diagnosis not present

## 2015-07-24 DIAGNOSIS — Z9889 Other specified postprocedural states: Secondary | ICD-10-CM | POA: Insufficient documentation

## 2015-07-24 DIAGNOSIS — Z9841 Cataract extraction status, right eye: Secondary | ICD-10-CM | POA: Diagnosis not present

## 2015-07-24 DIAGNOSIS — Z961 Presence of intraocular lens: Secondary | ICD-10-CM | POA: Diagnosis not present

## 2015-07-24 HISTORY — PX: HEMORRHOID SURGERY: SHX153

## 2015-07-24 SURGERY — HEMORRHOIDECTOMY
Anesthesia: General | Wound class: Clean Contaminated

## 2015-07-24 SURGERY — HEMORRHOIDECTOMY
Anesthesia: Choice

## 2015-07-24 MED ORDER — BUPIVACAINE-EPINEPHRINE (PF) 0.5% -1:200000 IJ SOLN
INTRAMUSCULAR | Status: AC
  Filled 2015-07-24: qty 30

## 2015-07-24 MED ORDER — BUPIVACAINE LIPOSOME 1.3 % IJ SUSP
INTRAMUSCULAR | Status: DC | PRN
  Administered 2015-07-24: 20 mL

## 2015-07-24 MED ORDER — LIDOCAINE HCL (CARDIAC) 20 MG/ML IV SOLN
INTRAVENOUS | Status: DC | PRN
  Administered 2015-07-24: 100 mg via INTRAVENOUS

## 2015-07-24 MED ORDER — ONDANSETRON HCL 4 MG/2ML IJ SOLN
INTRAMUSCULAR | Status: DC | PRN
  Administered 2015-07-24: 4 mg via INTRAVENOUS

## 2015-07-24 MED ORDER — FAMOTIDINE 20 MG PO TABS
20.0000 mg | ORAL_TABLET | Freq: Once | ORAL | Status: AC
  Administered 2015-07-24: 20 mg via ORAL

## 2015-07-24 MED ORDER — MIDAZOLAM HCL 2 MG/2ML IJ SOLN
INTRAMUSCULAR | Status: DC | PRN
  Administered 2015-07-24: 2 mg via INTRAVENOUS

## 2015-07-24 MED ORDER — PROPOFOL 10 MG/ML IV BOLUS
INTRAVENOUS | Status: DC | PRN
  Administered 2015-07-24: 110 mg via INTRAVENOUS

## 2015-07-24 MED ORDER — FAMOTIDINE 20 MG PO TABS
ORAL_TABLET | ORAL | Status: AC
  Administered 2015-07-24: 20 mg via ORAL
  Filled 2015-07-24: qty 1

## 2015-07-24 MED ORDER — DEXAMETHASONE SODIUM PHOSPHATE 4 MG/ML IJ SOLN
INTRAMUSCULAR | Status: DC | PRN
  Administered 2015-07-24: 10 mg via INTRAVENOUS

## 2015-07-24 MED ORDER — HYDROCODONE-ACETAMINOPHEN 5-325 MG PO TABS: 1.0000 | ORAL_TABLET | ORAL | Status: DC | PRN

## 2015-07-24 MED ORDER — ONDANSETRON HCL 4 MG/2ML IJ SOLN: 4.0000 mg | Freq: Once | INTRAMUSCULAR | Status: DC | PRN

## 2015-07-24 MED ORDER — KETOROLAC TROMETHAMINE 30 MG/ML IJ SOLN
INTRAMUSCULAR | Status: DC | PRN
  Administered 2015-07-24: 30 mg via INTRAVENOUS

## 2015-07-24 MED ORDER — FENTANYL CITRATE (PF) 100 MCG/2ML IJ SOLN
INTRAMUSCULAR | Status: DC | PRN
  Administered 2015-07-24: 100 ug via INTRAVENOUS

## 2015-07-24 MED ORDER — BUPIVACAINE LIPOSOME 1.3 % IJ SUSP
INTRAMUSCULAR | Status: AC
  Filled 2015-07-24: qty 20

## 2015-07-24 MED ORDER — LACTATED RINGERS IV SOLN
INTRAVENOUS | Status: DC
Start: 2015-07-24 — End: 2015-07-24
  Administered 2015-07-24 (×2): via INTRAVENOUS

## 2015-07-24 MED ORDER — FENTANYL CITRATE (PF) 100 MCG/2ML IJ SOLN: 25.0000 ug | INTRAMUSCULAR | Status: DC | PRN

## 2015-07-24 SURGICAL SUPPLY — 32 items
BLADE SURG 15 STRL LF DISP TIS (BLADE) ×1 IMPLANT
BLADE SURG 15 STRL SS (BLADE) ×3
CANISTER SUCT 1200ML W/VALVE (MISCELLANEOUS) ×3 IMPLANT
DRAPE LAPAROTOMY 100X77 ABD (DRAPES) ×3 IMPLANT
DRAPE LEGGINS SURG 28X43 STRL (DRAPES) ×3 IMPLANT
DRAPE UNDER BUTTOCK W/FLU (DRAPES) ×3 IMPLANT
GAUZE SPONGE 4X4 12PLY STRL (GAUZE/BANDAGES/DRESSINGS) ×3 IMPLANT
GLOVE BIO SURGEON STRL SZ7.5 (GLOVE) ×11 IMPLANT
GOWN STRL REUS W/ TWL LRG LVL3 (GOWN DISPOSABLE) ×2 IMPLANT
GOWN STRL REUS W/TWL LRG LVL3 (GOWN DISPOSABLE) ×6
HARMONIC SCALPEL FOCUS (MISCELLANEOUS) ×1 IMPLANT
LABEL OR SOLS (LABEL) ×3 IMPLANT
NDL HYPO 25X1 1.5 SAFETY (NEEDLE) ×1 IMPLANT
NDL SAFETY ECLIPSE 18X1.5 (NEEDLE) IMPLANT
NEEDLE HYPO 18GX1.5 SHARP (NEEDLE) ×3
NEEDLE HYPO 25X1 1.5 SAFETY (NEEDLE) ×3 IMPLANT
NS IRRIG 500ML POUR BTL (IV SOLUTION) ×3 IMPLANT
PACK BASIN MINOR ARMC (MISCELLANEOUS) ×3 IMPLANT
PAD ABD DERMACEA PRESS 5X9 (GAUZE/BANDAGES/DRESSINGS) ×1 IMPLANT
PAD GROUND ADULT SPLIT (MISCELLANEOUS) ×3 IMPLANT
PAD PREP 24X41 OB/GYN DISP (PERSONAL CARE ITEMS) ×3 IMPLANT
SOL PREP PVP 2OZ (MISCELLANEOUS) ×6
SOLUTION PREP PVP 2OZ (MISCELLANEOUS) ×1 IMPLANT
STAPLER PROXIMATE HCS (STAPLE) IMPLANT
STRAP SAFETY BODY (MISCELLANEOUS) ×3 IMPLANT
SURGILUBE 2OZ TUBE FLIPTOP (MISCELLANEOUS) ×3 IMPLANT
SUT CHROMIC 2 0 SH (SUTURE) ×5 IMPLANT
SUT CHROMIC 3 0 SH 27 (SUTURE) ×5 IMPLANT
SUT PROLENE 3 0 PS 2 (SUTURE) IMPLANT
SYR 30ML LL (SYRINGE) ×2 IMPLANT
SYR BULB IRRIG 60ML STRL (SYRINGE) ×2 IMPLANT
SYRINGE 10CC LL (SYRINGE) ×3 IMPLANT

## 2015-07-24 NOTE — H&P (Signed)
  She reports no change in condition since the day of the office visit. She does continue to have some hemorrhoidal symptoms with bleeding and minimal drainage and minimal recent pain.  Recent lab work were reviewed including CBC and metabolic panel see and pro time and all essentially normal  I discussed the plan for internal and external hemorrhoidectomy and perioperative care  [facility] M.D.

## 2015-07-24 NOTE — Anesthesia Postprocedure Evaluation (Signed)
  Anesthesia Post-op Note  Patient: Lisa [name]  Procedure(s) Performed: Procedure(s): HEMORRHOIDECTOMY/INTERNAL AND EXTERNAL (N/A)  Anesthesia type:General  Patient location: PACU  Post pain: Pain level controlled  Post assessment: Post-op Vital signs reviewed, Patient's Cardiovascular Status Stable, Respiratory Function Stable, Patent Airway and No signs of Nausea or vomiting  Post vital signs: Reviewed and stable  Last Vitals:  Filed Vitals:   07/24/15 1414  BP: 144/82  Pulse: 80  Temp: 36.1 C  Resp: 18    Level of consciousness: awake, alert  and patient cooperative  Complications: No apparent anesthesia complications

## 2015-07-24 NOTE — Anesthesia Preprocedure Evaluation (Addendum)
Anesthesia Evaluation  Patient identified by MRN, date of birth, ID band Patient awake    Reviewed: Allergy & Precautions, NPO status , Patient's Chart, lab work & pertinent test results  History of Anesthesia Complications Negative for: history of anesthetic complications  Airway Mallampati: I  TM Distance: >3 FB Neck ROM: Full    Dental  (+) Teeth Intact   Pulmonary asthma (and Sarcoidosis) ,           Cardiovascular + Peripheral Vascular Disease  + Valvular Problems/Murmurs MVP      Neuro/Psych TIA (light-headed no redidual)   GI/Hepatic Neg liver ROS, GERD (not medicated yet)  ,  Endo/Other  negative endocrine ROS  Renal/GU negative Renal ROS     Musculoskeletal   Abdominal   Peds  Hematology negative hematology ROS (+)   Anesthesia Other Findings   Reproductive/Obstetrics                             Anesthesia Physical Anesthesia Plan  ASA: II  Anesthesia Plan: General   Post-op Pain Management:    Induction: Intravenous  Airway Management Planned: LMA  Additional Equipment:   Intra-op Plan:   Post-operative Plan:   Informed Consent: I have reviewed the patients History and Physical, chart, labs and discussed the procedure including the risks, benefits and alternatives for the proposed anesthesia with the patient or authorized representative who has indicated his/her understanding and acceptance.     Plan Discussed with:   Anesthesia Plan Comments:        Anesthesia Quick Evaluation

## 2015-07-24 NOTE — Transfer of Care (Signed)
Immediate Anesthesia Transfer of Care Note  Patient: Lisa [name]  Procedure(s) Performed: Procedure(s): HEMORRHOIDECTOMY/INTERNAL AND EXTERNAL (N/A)  Patient Location: PACU  Anesthesia Type:General  Level of Consciousness: sedated  Airway & Oxygen Therapy: Patient Spontanous Breathing and Patient connected to face mask oxygen  Post-op Assessment: Report given to RN and Post -op Vital signs reviewed and stable  Post vital signs: Reviewed and stable  Last Vitals:  Filed Vitals:   07/24/15 1215  BP: 143/64  Pulse: 72  Temp: 36.1 C  Resp: 16    Complications: No apparent anesthesia complications

## 2015-07-24 NOTE — Discharge Instructions (Addendum)
Take Tylenol or Norco if needed for pain. Take MiraLAX as needed to help avoid constipation. May resume aspirin on Monday. Remove dressing tomorrow. May then shower and/or sitting in warm water. Talk gauze or pad in underwear and change as needed for drainage.

## 2015-07-24 NOTE — Op Note (Signed)
OPERATIVE REPORT  PREOPERATIVE  DIAGNOSIS: . Internal and external hemorrhoids  POSTOPERATIVE DIAGNOSIS: . Internal and external hemorrhoids  PROCEDURE: . Internal and external hemorrhoidectomy  ANESTHESIA:  General  SURGEON: Rochel Brome  MD   INDICATIONS: . She is a history of anal discomfort and bleeding and prolapse. A single large hemorrhoid with prolapse was identified posterior to the left and surgery was recommended for definitive treatment.  With the patient on the operating table in the supine position under general anesthesia she was placed in the lithotomy position using ankle straps. The anal area was prepared with Betadine solution and draped in a sterile manner.  The hemorrhoid was identified on the patient's left side which was approximately 5:00 position. There was prolapse of the internal component and a large external hemorrhoid. There was some surrounding induration of the skin. The anoderm was infiltrated with Exparel. Additional tissues outside of the sphincter were infiltrated as well. The anal canal was dilated. The bivalve anal retractor was introduced. There were some other smaller hemorrhoids identified. The internal and external hemorrhoid at the 5:00 position was removed. An elliptical excision was carried around the internal and external component and the hemorrhoid was excised using electrocautery for hemostasis. The location of the sphincter was identified and left intact. The wound was inspected and several small bleeding points were cauterized. The wound was repaired with interrupted 3-0 chromic simple sutures leaving a small opening externally for drainage. 2 additional internal hemorrhoids were ligated with 2-0 chromic. The site was further prepared with Betadine solution and infiltrated the remainder of a total of 30 cc of Exparel. Dressings were applied with paper tape  The patient appeared to tolerate the procedure satisfactorily and was then prepared for  transfer to the recovery room  [place].D.

## 2015-07-24 NOTE — Anesthesia Procedure Notes (Signed)
Procedure Name: LMA Insertion Date/Time: 07/24/2015 1:20 PM Performed by: Nelda Marseille Pre-anesthesia Checklist: Patient identified, Emergency Drugs available, Suction available, Patient being monitored and Timeout performed Patient Re-evaluated:Patient Re-evaluated prior to inductionOxygen Delivery Method: Circle system utilized Preoxygenation: Pre-oxygenation with 100% oxygen Intubation Type: IV induction Ventilation: Mask ventilation without difficulty LMA: LMA inserted LMA Size: 3.5 Tube type: Oral Number of attempts: 1 Placement Confirmation: positive ETCO2 and breath sounds checked- equal and bilateral Tube secured with: Tape Dental Injury: Teeth and Oropharynx as per pre-operative assessment

## 2015-07-29 LAB — SURGICAL PATHOLOGY

## 2015-08-08 ENCOUNTER — Inpatient Hospital Stay: Payer: Medicare Other | Attending: Internal Medicine | Admitting: Internal Medicine

## 2015-08-08 ENCOUNTER — Encounter: Payer: Self-pay | Admitting: Internal Medicine

## 2015-08-08 VITALS — BP 127/79 | HR 74 | Temp 97.6°F | Resp 18 | Ht 67.0 in | Wt 167.5 lb

## 2015-08-08 DIAGNOSIS — Z79899 Other long term (current) drug therapy: Secondary | ICD-10-CM | POA: Diagnosis not present

## 2015-08-08 DIAGNOSIS — Z7982 Long term (current) use of aspirin: Secondary | ICD-10-CM | POA: Insufficient documentation

## 2015-08-08 DIAGNOSIS — J8410 Pulmonary fibrosis, unspecified: Secondary | ICD-10-CM | POA: Diagnosis not present

## 2015-08-08 DIAGNOSIS — I341 Nonrheumatic mitral (valve) prolapse: Secondary | ICD-10-CM | POA: Diagnosis not present

## 2015-08-08 DIAGNOSIS — M8580 Other specified disorders of bone density and structure, unspecified site: Secondary | ICD-10-CM | POA: Insufficient documentation

## 2015-08-08 DIAGNOSIS — J45909 Unspecified asthma, uncomplicated: Secondary | ICD-10-CM | POA: Insufficient documentation

## 2015-08-08 DIAGNOSIS — H3530 Unspecified macular degeneration: Secondary | ICD-10-CM | POA: Diagnosis not present

## 2015-08-08 DIAGNOSIS — Z86010 Personal history of colonic polyps: Secondary | ICD-10-CM | POA: Diagnosis not present

## 2015-08-08 DIAGNOSIS — C210 Malignant neoplasm of anus, unspecified: Secondary | ICD-10-CM | POA: Diagnosis not present

## 2015-08-08 DIAGNOSIS — E785 Hyperlipidemia, unspecified: Secondary | ICD-10-CM | POA: Diagnosis not present

## 2015-08-08 DIAGNOSIS — Z8673 Personal history of transient ischemic attack (TIA), and cerebral infarction without residual deficits: Secondary | ICD-10-CM | POA: Diagnosis not present

## 2015-08-08 DIAGNOSIS — C44500 Unspecified malignant neoplasm of anal skin: Secondary | ICD-10-CM

## 2015-08-08 NOTE — Progress Notes (Signed)
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  female with no significant past medical history noted to have "external hemorrhoids" approximately 2 years ago. She had intermittent bleeding with mild discomfort and itching. Denies any significant pain; denies any lumps or bumps in the groin.   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 She has been referred to [place] for further evaluation.  She denies any cough; shortness of breath or chest pain or unusual weight loss.   ROS: A complete 10 point review of system is done which is negative except mentioned above in history of present illness  MEDICAL HISTORY:  Past Medical History  Diagnosis Date  . Sarcoidosis ([place])   . Asthma   . Irregular heartbeat   . TIA (transient ischemic attack)   . Osteopenia   . Macular degeneration   . Mitral valve prolapse   . Heart murmur   . Shortness of breath dyspnea     with exertion  . Arthritis   . Glaucoma (increased eye pressure)   . Headache   . Hyperlipidemia   . Colon polyp 05/22/2013    SURGICAL HISTORY: Past Surgical History  Procedure Laterality Date  . Hysterectomy with ovaries intact    . Cataract surgery  08/18/09    left eye  . Abdominal hysterectomy    . Eye surgery Bilateral     cataract extraction  . Tonsillectomy    . Dilation and curettage of uterus    . Hemorrhoid surgery N/A 07/24/2015    Procedure: HEMORRHOIDECTOMY/INTERNAL AND EXTERNAL;  Surgeon: Leonie Green, MD;  Location: [facility] ORS;  Service: General;  Laterality: N/A;  . Colonoscopy   05/22/2013    SOCIAL HISTORY: Social History   Social History  . Marital Status: Widowed    Spouse Name: N/A  . Number of Children: 1  . Years of Education: N/A   Occupational History  . retired from [employer]  . Smoking status: Never Smoker   . Smokeless tobacco: Never Used  . Alcohol Use: No  . Drug Use: No  . Sexual Activity: Not on file   Other Topics Concern  . Not on file   Social History Narrative    FAMILY HISTORY: No family history on file.  ALLERGIES:  is allergic to aspirin; dairy aid; diazepam; and shellfish-derived products.  MEDICATIONS:  Current Outpatient Prescriptions  Medication Sig Dispense Refill  . ALPHAGAN P 0.15 % ophthalmic solution Place 2 drops into the left eye 2 (two) times daily.     Marland Kitchen aspirin EC 81 MG tablet Take 81 mg by mouth daily.    . beclomethasone (QVAR) 80 MCG/ACT inhaler Inhale 2 puffs into the lungs 2 (two) times daily. 3 Inhaler 3  . cholecalciferol (VITAMIN D) 1000 UNITS tablet Take 1,000 Units by mouth daily.     . Magnesium 400 MG TABS Take 1 tablet by mouth daily.    Marland Kitchen MAXAIR AUTOHALER 200 MCG/INH inhaler Inhale 2 puffs into the lungs as needed.    . Multiple Vitamin (MULTIVITAMIN WITH MINERALS) TABS Take 1 tablet by  mouth daily. Centrum Silver    . Multiple Vitamins-Minerals (PRESERVISION AREDS) CAPS Take 1 capsule by mouth daily.    . potassium chloride (K-DUR) 10 MEQ tablet Take 20 mEq by mouth 2 (two) times daily.    . rosuvastatin (CRESTOR) 5 MG tablet Take 5 mg by mouth daily.    Marland Kitchen omeprazole (PRILOSEC) 20 MG capsule Take 1 capsule by mouth 2 (two) times daily.  11   No current facility-administered medications for this visit.      Marland Kitchen  PHYSICAL EXAMINATION: ECOG PERFORMANCE STATUS: 0 - Asymptomatic  Filed Vitals:   08/08/15 1400  BP: 127/79  Pulse: 74  Temp: 97.6 F (36.4 C)  Resp: 18   Filed Weights   08/08/15 1400  Weight: 167 lb 8.8 oz (76 kg)    GENERAL:  Well-nourished well-developed; Alert, no distress and comfortable.  Accompanied by daughter and friend. EYES: no pallor or icterus OROPHARYNX: no thrush or ulceration; good dentition  NECK: supple, no masses felt LYMPH:  no palpable lymphadenopathy in the cervical, axillary or inguinal regions LUNGS: clear to auscultation and  No wheeze or crackles HEART/CVS: regular rate & rhythm and no murmurs; No lower extremity edema ABDOMEN: abdomen soft, non-tender and normal bowel sounds Musculoskeletal:no cyanosis of digits and no clubbing  PSYCH: alert & oriented x 3 with fluent speech NEURO: no focal motor/sensory deficits SKIN:  no rashes or significant lesions External anal exam- ~1 cm hemorroid present; no active bleeding. Digital rectal exam not done.    LABORATORY DATA:  I have reviewed the data as listed Lab Results  Component Value Date   WBC 6.6 07/15/2015   HGB 13.3 07/15/2015   HCT 40.3 07/15/2015   MCV 96.2 07/15/2015   PLT 306 07/15/2015    Recent Labs  07/15/15 1407  NA 141  K 3.9  CL 101  CO2 31  GLUCOSE 92  BUN 15  CREATININE 0.63  CALCIUM 10.1  GFRNONAA >60  GFRAA >60  PROT 7.4  ALBUMIN 4.0  AST 18  ALT 17  ALKPHOS 69  BILITOT 0.5    RADIOGRAPHIC STUDIES: I have personally reviewed the radiological images as listed and agreed with the findings in the report. No results found.  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 I would recommend reexcision with wider margin. I would recommend a PET scan for further evaluation.   If this cannot be done for any reason/ concerns for sphincter compromise/incontinence; or imaging is concerning of advanced disease- then I would recommend radiation plus minus chemotherapy. However if reexcision could be done; then recommendations for adjuvant therapy would be based on final pathology.   I also spoke to Dr. Tamala Julian who agreed with above plan.   I discussed  follow-up plans with the patient and her family in detail.All questions were answered. The patient knows to call the clinic with any problems, questions or concerns.   I spent 30 minutes counseling the patient face to face. The total time spent in the appointment was 60 minutes and more than 50% was on counseling.     Cammie Sickle, MD 08/08/2015 2:35 PM

## 2015-08-11 ENCOUNTER — Other Ambulatory Visit: Payer: Self-pay | Admitting: *Deleted

## 2015-08-11 DIAGNOSIS — C210 Malignant neoplasm of anus, unspecified: Secondary | ICD-10-CM

## 2015-08-13 ENCOUNTER — Ambulatory Visit
Admission: RE | Admit: 2015-08-13 | Discharge: 2015-08-13 | Disposition: A | Discharge status: Home / Self Care | Payer: Medicare Other | Source: Ambulatory Visit | Attending: Internal Medicine | Admitting: Internal Medicine

## 2015-08-13 ENCOUNTER — Telehealth: Payer: Self-pay | Admitting: *Deleted

## 2015-08-13 DIAGNOSIS — C44500 Unspecified malignant neoplasm of anal skin: Secondary | ICD-10-CM | POA: Insufficient documentation

## 2015-08-13 DIAGNOSIS — K8020 Calculus of gallbladder without cholecystitis without obstruction: Secondary | ICD-10-CM | POA: Insufficient documentation

## 2015-08-13 DIAGNOSIS — Z79899 Other long term (current) drug therapy: Secondary | ICD-10-CM | POA: Diagnosis not present

## 2015-08-13 DIAGNOSIS — K5730 Diverticulosis of large intestine without perforation or abscess without bleeding: Secondary | ICD-10-CM | POA: Insufficient documentation

## 2015-08-13 LAB — GLUCOSE, CAPILLARY: Glucose-Capillary: 71 mg/dL (ref 65–99)

## 2015-08-13 MED ORDER — FLUDEOXYGLUCOSE F - 18 (FDG) INJECTION
13.1300 | Freq: Once | INTRAVENOUS | Status: DC | PRN
  Administered 2015-08-13: 13.13 via INTRAVENOUS
  Filled 2015-08-13: qty 13.13

## 2015-08-13 NOTE — Telephone Encounter (Signed)
Addendum has been made on the pet scan. Read back process performed with Olivia Mackie, Radiology tech. 08/13/15 at 1112am  Read back ADDENDUM: "The presence of some focal soft tissue thickening and hypermetabolism adjacent to the ileocecal valve was discussed in the original report, but was omitted from the impression section. This may simply represent metabolic activity in an area of normal mucosa, however, the possibility of a synchronous colonic neoplasm in this area is not excluded, and correlation with colonoscopy is suggested."  Dr. Rogue Bussing informed. Read back process performed with md on 08/13/15 at 1113am

## 2015-08-14 ENCOUNTER — Other Ambulatory Visit: Payer: Medicare Other

## 2015-08-15 ENCOUNTER — Encounter: Payer: Self-pay | Admitting: *Deleted

## 2015-08-15 ENCOUNTER — Other Ambulatory Visit: Payer: Medicare Other

## 2015-08-15 NOTE — Patient Instructions (Signed)
  Your procedure is scheduled on: 08-21-15 Report to [place] To find out your arrival time please call [phone] between 1PM - 3PM on 08-20-15  Remember: Instructions that are not followed completely may result in serious medical risk, up to and including death, or upon the discretion of your surgeon and anesthesiologist your surgery may need to be rescheduled.    _X___ 1. Do not eat food or drink liquids after midnight. No gum chewing or hard candies.     _X___ 2. No Alcohol for 24 hours before or after surgery.   ____ 3. Bring all medications with you on the day of surgery if instructed.    ____ 4. Notify your doctor if there is any change in your medical condition     (cold, fever, infections).     Do not wear jewelry, make-up, hairpins, clips or nail polish.  Do not wear lotions, powders, or perfumes. You may wear deodorant.  Do not shave 48 hours prior to surgery. Men may shave face and neck.  Do not bring valuables to the hospital.    [facility] is not responsible for any belongings or valuables.               Contacts, dentures or bridgework may not be worn into surgery.  Leave your suitcase in the car. After surgery it may be brought to your room.  For patients admitted to the hospital, discharge time is determined by your treatment team.   Patients discharged the day of surgery will not be allowed to drive home.   Please read over the following fact sheets that you were given:      _X___ Take these medicines the morning of surgery with A SIP OF WATER:    1. MAGNESIUM  2. CRESTOR  3. PRILOSEC  4.  5.  6.  ____ Fleet Enema (as directed)   ____ Use CHG Soap as directed  _X___ Use inhalers on the day of surgery-USE MAXAIR AND QVAR AT [place]  ____ Stop metformin 2 days prior to surgery    ____ Take 1/2 of usual insulin dose the night before surgery and none on the morning of surgery.   ____ Stop  Coumadin/Plavix/aspirin-PT STOPPED ASA ON 08-12-15  ____ Stop Anti-inflammatories-NO NSAIDS OR ASA PRODUCTS-TYLENOL OK   __X__ Stop supplements until after surgery-STOP PRESERVISION NOW   ____ Bring C-Pap to the hospital.

## 2015-08-20 ENCOUNTER — Inpatient Hospital Stay (HOSPITAL_BASED_OUTPATIENT_CLINIC_OR_DEPARTMENT_OTHER): Payer: Medicare Other | Admitting: Internal Medicine

## 2015-08-20 VITALS — BP 142/75 | HR 70 | Temp 97.0°F | Resp 18 | Ht 67.0 in | Wt 167.8 lb

## 2015-08-20 DIAGNOSIS — C44500 Unspecified malignant neoplasm of anal skin: Secondary | ICD-10-CM | POA: Insufficient documentation

## 2015-08-20 DIAGNOSIS — C210 Malignant neoplasm of anus, unspecified: Secondary | ICD-10-CM | POA: Diagnosis not present

## 2015-08-20 DIAGNOSIS — J8410 Pulmonary fibrosis, unspecified: Secondary | ICD-10-CM | POA: Diagnosis not present

## 2015-08-20 NOTE — Progress Notes (Signed)
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  # History of sarcoidosis- PET scan-calcified granulomas/mild hilar/medicinal adenopathy.   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  Patient is awaiting to have a reexcision done October 25. She is also waiting to have a colonoscopy on November 14.  Patient has intermittent pain in the perianal region. No discharge no signs of infection.  REVIEW OF SYSTEMS:  A complete 10 point review of system is done which is negative except mentioned above/history of present illness.   PAST MEDICAL HISTORY :  Past Medical History  Diagnosis Date  . Sarcoidosis ([place])   . Asthma   . Irregular heartbeat   . TIA (transient ischemic attack)   . Osteopenia   . Macular degeneration   . Mitral valve prolapse   . Heart murmur   . Shortness of breath dyspnea     with exertion  . Arthritis   . Glaucoma (increased eye pressure)   . Headache   . Hyperlipidemia   . Colon polyp 05/22/2013    PAST SURGICAL HISTORY :   Past Surgical History  Procedure Laterality Date  . Hysterectomy with ovaries intact    . Cataract surgery  08/18/09    left eye  . Abdominal hysterectomy    . Eye surgery Bilateral     cataract extraction  . Tonsillectomy    . Dilation and curettage of uterus    . Hemorrhoid surgery N/A 07/24/2015    Procedure: HEMORRHOIDECTOMY/INTERNAL AND EXTERNAL;   Surgeon: Leonie Green, MD;  Location: [facility] ORS;  Service: General;  Laterality: N/A;  . Colonoscopy  05/22/2013    FAMILY HISTORY :  No family history on file.  SOCIAL HISTORY:   Social History  Substance Use Topics  . Smoking status: Never Smoker   . Smokeless tobacco: Never Used  . Alcohol Use: No    ALLERGIES:  is allergic to aspirin; dairy aid; diazepam; and shellfish-derived products.  MEDICATIONS:  Current Outpatient Prescriptions  Medication Sig Dispense Refill  . ALPHAGAN P 0.15 % ophthalmic solution Place 2 drops into the left eye 2 (two) times daily.     Marland Kitchen aspirin EC 81 MG tablet Take 81 mg by mouth daily.    . beclomethasone (QVAR) 80 MCG/ACT inhaler Inhale 2 puffs into the lungs 2 (two) times daily. 3 Inhaler 3  . cholecalciferol (VITAMIN D) 1000 UNITS tablet Take 1,000 Units by mouth daily.     . Magnesium 400 MG TABS Take 1 tablet by mouth every morning.     Marland Kitchen MAXAIR AUTOHALER 200 MCG/INH inhaler Inhale 2 puffs into the lungs as needed.    . Multiple Vitamin (MULTIVITAMIN WITH MINERALS) TABS Take 1 tablet by mouth daily. Centrum Silver    . Multiple Vitamins-Minerals (PRESERVISION AREDS) CAPS Take 1 capsule by mouth daily.    Marland Kitchen omeprazole (PRILOSEC) 20 MG capsule Take 1 capsule by mouth 2 (two) times daily.  11  . potassium chloride (K-DUR) 10 MEQ  tablet Take 20 mEq by mouth 2 (two) times daily.    . rosuvastatin (CRESTOR) 5 MG tablet Take 5 mg by mouth every morning.      No current facility-administered medications for this visit.    PHYSICAL EXAMINATION: ECOG PERFORMANCE STATUS: 0 - Asymptomatic  BP 142/75 mmHg  Pulse 70  Temp(Src) 97 F (36.1 C) (Tympanic)  Resp 18  Ht 5\' 7"  (1.702 m)  Wt 167 lb 12.3 oz (76.1 kg)  BMI 26.27 kg/m2  Filed Weights   08/20/15 1200  Weight: 167 lb 12.3 oz (76.1 kg)    GENERAL: Well-nourished well-developed; Alert, no distress and comfortable.   Accompanied by her daughter.    LABORATORY DATA:  I have reviewed  the data as listed    Component Value Date/Time   NA 141 07/15/2015 1407   K 3.9 07/15/2015 1407   CL 101 07/15/2015 1407   CO2 31 07/15/2015 1407   GLUCOSE 92 07/15/2015 1407   BUN 15 07/15/2015 1407   CREATININE 0.63 07/15/2015 1407   CALCIUM 10.1 07/15/2015 1407   PROT 7.4 07/15/2015 1407   ALBUMIN 4.0 07/15/2015 1407   AST 18 07/15/2015 1407   ALT 17 07/15/2015 1407   ALKPHOS 69 07/15/2015 1407   BILITOT 0.5 07/15/2015 1407   GFRNONAA >60 07/15/2015 1407   GFRAA >60 07/15/2015 1407    No results found for: SPEP, UPEP  Lab Results  Component Value Date   WBC 6.6 07/15/2015   NEUTROABS 4.9 07/15/2015   HGB 13.3 07/15/2015   HCT 40.3 07/15/2015   MCV 96.2 07/15/2015   PLT 306 07/15/2015      Chemistry      Component Value Date/Time   NA 141 07/15/2015 1407   K 3.9 07/15/2015 1407   CL 101 07/15/2015 1407   CO2 31 07/15/2015 1407   BUN 15 07/15/2015 1407   CREATININE 0.63 07/15/2015 1407      Component Value Date/Time   CALCIUM 10.1 07/15/2015 1407   ALKPHOS 69 07/15/2015 1407   AST 18 07/15/2015 1407   ALT 17 07/15/2015 1407   BILITOT 0.5 07/15/2015 1407       RADIOGRAPHIC STUDIES: I have personally reviewed the radiological images as listed and agreed with the findings in the report. No results found.   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 Patient is awaiting the resection tomorrow. I reviewed the PET scan with the patient/daughter in detail.- No concerns for any metastatic disease from the squamous cell carcinoma suspected at this time.  # Uptake in the cecal area- awaiting a colonoscopy on normal 14th.  # The granulomatous lung disease/hilar/mediastinal adenopathy- as noted in the PET scan correlates with a history of sarcoidosis that the patient has.   No orders of the defined types were placed in this encounter.   All questions were answered. The patient  knows to call the clinic with any problems, questions or concerns. No barriers to learning was detected. I spent 15 minutes counseling the patient face to face. The total time spent in the appointment was 30 minutes and more than 50% was on counseling and review of test results     Cammie Sickle, MD 08/20/2015 12:08 PM

## 2015-08-21 ENCOUNTER — Ambulatory Visit: Payer: Medicare Other | Admitting: Certified Registered"

## 2015-08-21 ENCOUNTER — Encounter: Payer: Self-pay | Admitting: *Deleted

## 2015-08-21 ENCOUNTER — Ambulatory Visit
Admission: RE | Admit: 2015-08-21 | Discharge: 2015-08-21 | Disposition: A | Discharge status: Home / Self Care | Payer: Medicare Other | Source: Ambulatory Visit | Attending: Surgery | Admitting: Surgery

## 2015-08-21 ENCOUNTER — Encounter
Admission: RE | Disposition: A | Discharge status: Home / Self Care | Payer: Self-pay | Source: Ambulatory Visit | Attending: Surgery

## 2015-08-21 DIAGNOSIS — Z86010 Personal history of colonic polyps: Secondary | ICD-10-CM | POA: Insufficient documentation

## 2015-08-21 DIAGNOSIS — Z8673 Personal history of transient ischemic attack (TIA), and cerebral infarction without residual deficits: Secondary | ICD-10-CM | POA: Insufficient documentation

## 2015-08-21 DIAGNOSIS — Z90710 Acquired absence of both cervix and uterus: Secondary | ICD-10-CM | POA: Diagnosis not present

## 2015-08-21 DIAGNOSIS — R87613 High grade squamous intraepithelial lesion on cytologic smear of cervix (HGSIL): Secondary | ICD-10-CM | POA: Diagnosis not present

## 2015-08-21 DIAGNOSIS — M1990 Unspecified osteoarthritis, unspecified site: Secondary | ICD-10-CM | POA: Insufficient documentation

## 2015-08-21 DIAGNOSIS — Z91013 Allergy to seafood: Secondary | ICD-10-CM | POA: Insufficient documentation

## 2015-08-21 DIAGNOSIS — Z7982 Long term (current) use of aspirin: Secondary | ICD-10-CM | POA: Diagnosis not present

## 2015-08-21 DIAGNOSIS — H3530 Unspecified macular degeneration: Secondary | ICD-10-CM | POA: Insufficient documentation

## 2015-08-21 DIAGNOSIS — Z886 Allergy status to analgesic agent status: Secondary | ICD-10-CM | POA: Insufficient documentation

## 2015-08-21 DIAGNOSIS — Z888 Allergy status to other drugs, medicaments and biological substances status: Secondary | ICD-10-CM | POA: Diagnosis not present

## 2015-08-21 DIAGNOSIS — E785 Hyperlipidemia, unspecified: Secondary | ICD-10-CM | POA: Insufficient documentation

## 2015-08-21 DIAGNOSIS — R85613 High grade squamous intraepithelial lesion on cytologic smear of anus (HGSIL): Secondary | ICD-10-CM | POA: Diagnosis present

## 2015-08-21 DIAGNOSIS — I341 Nonrheumatic mitral (valve) prolapse: Secondary | ICD-10-CM | POA: Insufficient documentation

## 2015-08-21 DIAGNOSIS — J45909 Unspecified asthma, uncomplicated: Secondary | ICD-10-CM | POA: Insufficient documentation

## 2015-08-21 DIAGNOSIS — C210 Malignant neoplasm of anus, unspecified: Secondary | ICD-10-CM | POA: Insufficient documentation

## 2015-08-21 DIAGNOSIS — M8580 Other specified disorders of bone density and structure, unspecified site: Secondary | ICD-10-CM | POA: Diagnosis not present

## 2015-08-21 DIAGNOSIS — Z9889 Other specified postprocedural states: Secondary | ICD-10-CM | POA: Insufficient documentation

## 2015-08-21 DIAGNOSIS — Z79899 Other long term (current) drug therapy: Secondary | ICD-10-CM | POA: Diagnosis not present

## 2015-08-21 HISTORY — PX: MASS EXCISION: SHX2000

## 2015-08-21 SURGERY — EXCISION MASS
Anesthesia: General | Wound class: Clean Contaminated

## 2015-08-21 MED ORDER — LIDOCAINE HCL (CARDIAC) 20 MG/ML IV SOLN
INTRAVENOUS | Status: DC | PRN
  Administered 2015-08-21: 50 mg via INTRAVENOUS

## 2015-08-21 MED ORDER — HYDROCODONE-ACETAMINOPHEN 5-325 MG PO TABS: 1.0000 | ORAL_TABLET | ORAL | Status: DC | PRN

## 2015-08-21 MED ORDER — PROPOFOL 10 MG/ML IV BOLUS
INTRAVENOUS | Status: DC | PRN
  Administered 2015-08-21: 130 mg via INTRAVENOUS

## 2015-08-21 MED ORDER — FENTANYL CITRATE (PF) 100 MCG/2ML IJ SOLN: 25.0000 ug | INTRAMUSCULAR | Status: DC | PRN

## 2015-08-21 MED ORDER — BUPIVACAINE-EPINEPHRINE (PF) 0.5% -1:200000 IJ SOLN
INTRAMUSCULAR | Status: DC | PRN
  Administered 2015-08-21: 25 mL

## 2015-08-21 MED ORDER — BUPIVACAINE-EPINEPHRINE (PF) 0.5% -1:200000 IJ SOLN
INTRAMUSCULAR | Status: AC
  Filled 2015-08-21: qty 30

## 2015-08-21 MED ORDER — ONDANSETRON HCL 4 MG/2ML IJ SOLN
4.0000 mg | Freq: Once | INTRAMUSCULAR | Status: AC | PRN
  Administered 2015-08-21: 4 mg via INTRAVENOUS

## 2015-08-21 MED ORDER — ONDANSETRON HCL 4 MG/2ML IJ SOLN
INTRAMUSCULAR | Status: AC
  Administered 2015-08-21: 4 mg via INTRAVENOUS
  Filled 2015-08-21: qty 2

## 2015-08-21 MED ORDER — MIDAZOLAM HCL 2 MG/2ML IJ SOLN
INTRAMUSCULAR | Status: DC | PRN
  Administered 2015-08-21: 1 mg via INTRAVENOUS

## 2015-08-21 MED ORDER — LACTATED RINGERS IV SOLN
INTRAVENOUS | Status: DC
  Administered 2015-08-21: 07:00:00 via INTRAVENOUS

## 2015-08-21 MED ORDER — BUPIVACAINE LIPOSOME 1.3 % IJ SUSP
INTRAMUSCULAR | Status: AC
  Filled 2015-08-21: qty 20

## 2015-08-21 MED ORDER — FENTANYL CITRATE (PF) 100 MCG/2ML IJ SOLN
INTRAMUSCULAR | Status: DC | PRN
  Administered 2015-08-21 (×2): 50 ug via INTRAVENOUS

## 2015-08-21 SURGICAL SUPPLY — 32 items
BLADE SURG 15 STRL LF DISP TIS (BLADE) ×1 IMPLANT
BLADE SURG 15 STRL SS (BLADE) ×2
CANISTER SUCT 1200ML W/VALVE (MISCELLANEOUS) ×2 IMPLANT
DRAPE LAPAROTOMY 100X77 ABD (DRAPES) ×2 IMPLANT
DRAPE LEGGINS SURG 28X43 STRL (DRAPES) ×2 IMPLANT
DRAPE UNDER BUTTOCK W/FLU (DRAPES) ×2 IMPLANT
GAUZE SPONGE 4X4 12PLY STRL (GAUZE/BANDAGES/DRESSINGS) ×2 IMPLANT
GLOVE BIO SURGEON STRL SZ7.5 (GLOVE) ×6 IMPLANT
GOWN STRL REUS W/ TWL LRG LVL3 (GOWN DISPOSABLE) ×2 IMPLANT
GOWN STRL REUS W/TWL LRG LVL3 (GOWN DISPOSABLE) ×6
HARMONIC SCALPEL FOCUS (MISCELLANEOUS) ×1 IMPLANT
LABEL OR SOLS (LABEL) ×1 IMPLANT
NDL HYPO 25X1 1.5 SAFETY (NEEDLE) ×1 IMPLANT
NEEDLE HYPO 25X1 1.5 SAFETY (NEEDLE) ×2 IMPLANT
NS IRRIG 500ML POUR BTL (IV SOLUTION) ×2 IMPLANT
PACK BASIN MINOR ARMC (MISCELLANEOUS) ×2 IMPLANT
PAD ABD DERMACEA PRESS 5X9 (GAUZE/BANDAGES/DRESSINGS) ×2 IMPLANT
PAD GROUND ADULT SPLIT (MISCELLANEOUS) ×2 IMPLANT
PAD PREP 24X41 OB/GYN DISP (PERSONAL CARE ITEMS) ×2 IMPLANT
SOL PREP PVP 2OZ (MISCELLANEOUS) ×2
SOLUTION PREP PVP 2OZ (MISCELLANEOUS) ×1 IMPLANT
SPONGE XRAY 4X4 16PLY STRL (MISCELLANEOUS) ×1 IMPLANT
STAPLER PROXIMATE HCS (STAPLE) IMPLANT
STRAP SAFETY BODY (MISCELLANEOUS) ×2 IMPLANT
SURGILUBE 2OZ TUBE FLIPTOP (MISCELLANEOUS) ×2 IMPLANT
SUT CHROMIC 2 0 SH (SUTURE) ×4 IMPLANT
SUT CHROMIC 3 0 SH 27 (SUTURE) ×4 IMPLANT
SUT ETHILON 3-0 FS-10 30 BLK (SUTURE) ×2
SUT PROLENE 3 0 PS 2 (SUTURE) IMPLANT
SUTURE EHLN 3-0 FS-10 30 BLK (SUTURE) IMPLANT
SYR BULB EAR ULCER 3OZ GRN STR (SYRINGE) ×1 IMPLANT
SYRINGE 10CC LL (SYRINGE) ×2 IMPLANT

## 2015-08-21 NOTE — Op Note (Signed)
OPERATIVE REPORT  PREOPERATIVE  DIAGNOSIS: . Anal squamous cell carcinoma  POSTOPERATIVE DIAGNOSIS: . Anal squamous cell carcinoma  PROCEDURE: . Excision of anal mass  ANESTHESIA:  General  SURGEON: Rochel Brome  MD   INDICATIONS: . She had recent excision of a hemorrhoid and had findings of squamous cell carcinoma in situ with a focal area of infiltrating squamous cell carcinoma. Margins were involved. Reexcision was recommended for further treatment.  With the patient on the operating table in the supine position under general anesthesia the legs were elevated into the lithotomy position using ankle straps. The anal area was prepared with Betadine solution and draped in a sterile manner. Inspection revealed that there was a scar from recent excision of hemorrhoid which was posterior and on the patient's left side at 5:00 position of the anus. There was abnormal appearance of the skin extending posteriorly approximately 4 cm and this was approximately 2 cm in width. The skin surrounding this was scored with the scalpel in anticipation of removing approximately 4 mm of normal-appearing skin there was also a V-shaped incision which was carried towards the patient's left side approximately 1 cm in length to include an island of abnormal appearance of the skin. The bivalve anal retractor was introduced. Some liquid stool was evacuated with suction and the site was irrigated with saline solution. There was no evidence of tumor within the anal canal. The proximal extent of excision was carried up into the mucosa just above the dentate line Electrocautery was used to incise into the subcutaneous tissues circumferentially. Multiple small bleeding points were cauterized. The thickness of the resected specimen was approximately 5 mm. The outer extreme end of the excision was tagged with a 3-0 nylon stitch to mark the 6:00 position the specimen. The specimen was submitted in formalin for routine pathology the  wound was inspected and multiple small bleeding points were cauterized. The subcutaneous tissues were infiltrated with half percent Sensorcaine with epinephrine. Several additional bleeding points were cauterized as the wound was inspected and subsequently determined hemostasis was intact. The previously mentioned V-shaped extension was closed with a 3-0 chromic subcuticular suture and the incision was then closed with a combination of 3-0 chromic subcuticular sutures and simple sutures. A 1 cm opening was left at the 6:00 and of the excision for drainage. The site was further cleaned with Betadine and infiltrated additional Sensorcaine with epinephrine. Dressings were applied with paper tape  The patient tolerated the procedure satisfactorily and was prepared for transfer to the recovery room  [place].D.

## 2015-08-21 NOTE — Transfer of Care (Signed)
Immediate Anesthesia Transfer of Care Note  Patient: Lisa [name]  Procedure(s) Performed: Procedure(s): EXCISION MASS/ ANAL MASS (N/A)  Patient Location: PACU  Anesthesia Type:General  Level of Consciousness: awake  Airway & Oxygen Therapy: Patient Spontanous Breathing and Patient connected to face mask oxygen  Post-op Assessment: Report given to RN  Post vital signs: Reviewed  Last Vitals:  Filed Vitals:   08/21/15 0847  BP: 151/95  Pulse: 96  Temp: 36.1 C  Resp: 20    Complications: No apparent anesthesia complications

## 2015-08-21 NOTE — Anesthesia Procedure Notes (Signed)
Procedure Name: LMA Insertion Performed by: Shean Gerding Pre-anesthesia Checklist: Patient identified, Patient being monitored, Timeout performed, Emergency Drugs available and Suction available Patient Re-evaluated:Patient Re-evaluated prior to inductionOxygen Delivery Method: Circle system utilized Preoxygenation: Pre-oxygenation with 100% oxygen Intubation Type: IV induction LMA: LMA inserted LMA Size: 4.0 Tube type: Oral Number of attempts: 1 Placement Confirmation: positive ETCO2 and breath sounds checked- equal and bilateral Tube secured with: Tape Dental Injury: Teeth and Oropharynx as per pre-operative assessment        

## 2015-08-21 NOTE — Discharge Instructions (Addendum)
Take Tylenol or Norco if needed for pain. Resume aspirin on Monday. Remove dressing tomorrow. May then shower and/or sitting in warm water. Tuck gauze or pad in underwear and change as needed for drainage.   AMBULATORY SURGERY  DISCHARGE INSTRUCTIONS   1) The drugs that you were given will stay in your system until tomorrow so for the next 24 hours you should not:  A) Drive an automobile B) Make any legal decisions C) Drink any alcoholic beverage   2) You may resume regular meals tomorrow.  Today it is better to start with liquids and gradually work up to solid foods.  You may eat anything you prefer, but it is better to start with liquids, then soup and crackers, and gradually work up to solid foods.   3) Please notify your doctor immediately if you have any unusual bleeding, trouble breathing, redness and pain at the surgery site, drainage, fever, or pain not relieved by medication.    4) Additional Instructions:        Please contact your physician with any problems or Same Day Surgery at [phone], Monday through Friday 6 am to 4 pm, or [place] at [facility] number at [phone].

## 2015-08-21 NOTE — H&P (Signed)
Lisa [name] is an 74 y.o. female.   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 Margins were involved. She has had oncology consultation Reexcision was recommended for further evaluation and treatment.  Past Medical History  Diagnosis Date  . Sarcoidosis ([place])   . Asthma   . Irregular heartbeat   . TIA (transient ischemic attack)   . Osteopenia   . Macular degeneration   . Mitral valve prolapse   . Heart murmur   . Shortness of breath dyspnea     with exertion  . Arthritis   . Glaucoma (increased eye pressure)   . Headache   . Hyperlipidemia   . Colon polyp 05/22/2013    Past Surgical History  Procedure Laterality Date  . Hysterectomy with ovaries intact    . Cataract surgery  08/18/09    left eye  . Abdominal hysterectomy    . Eye surgery Bilateral     cataract extraction  . Tonsillectomy    . Dilation and curettage of uterus    . Hemorrhoid surgery N/A 07/24/2015    Procedure: HEMORRHOIDECTOMY/INTERNAL AND EXTERNAL;  Surgeon: Lisa Green, MD;  Location: [facility] ORS;  Service: General;  Laterality: N/A;  . Colonoscopy  05/22/2013    History reviewed. No pertinent family history. Social History:  reports that she has never smoked. She has never used smokeless tobacco. She reports that she does not drink alcohol or use illicit drugs.  Allergies:  Allergies  Allergen Reactions  . Aspirin Other (See Comments)    Pt can take low dose aspirin with no reaction  . Dairy Aid [Lactase] Other (See Comments)  . Diazepam Other (See Comments)    Affected her hearing and sight  . Shellfish-Derived Products Swelling    Medications Prior to Admission  Medication Sig Dispense Refill  . ALPHAGAN P 0.15 % ophthalmic solution Place 2 drops into the left eye 2 (two) times daily.     Marland Kitchen aspirin EC 81 MG tablet Take 81 mg by mouth daily.    . beclomethasone  (QVAR) 80 MCG/ACT inhaler Inhale 2 puffs into the lungs 2 (two) times daily. 3 Inhaler 3  . cholecalciferol (VITAMIN D) 1000 UNITS tablet Take 1,000 Units by mouth daily.     . Magnesium 400 MG TABS Take 1 tablet by mouth every morning.     Marland Kitchen MAXAIR AUTOHALER 200 MCG/INH inhaler Inhale 2 puffs into the lungs as needed.    . Multiple Vitamin (MULTIVITAMIN WITH MINERALS) TABS Take 1 tablet by mouth daily. Centrum Silver    . Multiple Vitamins-Minerals (PRESERVISION AREDS) CAPS Take 1 capsule by mouth daily.    Marland Kitchen omeprazole (PRILOSEC) 20 MG capsule Take 1 capsule by mouth 2 (two) times daily.  11  . potassium chloride (K-DUR) 10 MEQ tablet Take 20 mEq by mouth 2 (two) times daily.    . rosuvastatin (CRESTOR) 5 MG tablet Take 5 mg by mouth every morning.       No results found for this or any previous visit (from the past 48 hour(s)). No results found.  ROS she reports no new illness during the interval. No difficulties breathing, no chest pain. She says some moderate discomfort in her upper abdomen. She is moving her bowels satisfactorily voiding satisfactorily. She reports no ankle edema. Review of systems otherwise negative  Blood pressure 143/67, pulse 65, temperature 97.9 F (36.6 C), temperature  source Oral, resp. rate 16, height 5\' 7"  (1.702 m), weight 75.751 kg (167 lb), SpO2 100 %. Physical Exam she is awake alert and oriented  HEENT:  Head is normocephalic.  Pupils are equal reactive to light.  Extraocular movements are intact. Sclera is clear.  Pharynx is clear.  HEART:  Regular rhythm S1-S2, without murmur.  LUNGS:  Clear without rales rhonchi or wheezes.   Abdomen: Soft flat and nontender   Extremities with no dependent edema  Neurological awake alert and oriented and moving all extremities    Assessment/Plan diagnosis squamous cell carcinoma of the anus Plan is to re-excise as discussed the operation care risk and benefits.  Lisa [name] 08/21/2015, 7:22 AM

## 2015-08-21 NOTE — Anesthesia Postprocedure Evaluation (Signed)
  Anesthesia Post-op Note  Patient: Lisa [name]  Procedure(s) Performed: Procedure(s): EXCISION MASS/ ANAL MASS (N/A)  Anesthesia type:General  Patient location: PACU  Post pain: Pain level controlled  Post assessment: Post-op Vital signs reviewed, Patient's Cardiovascular Status Stable, Respiratory Function Stable, Patent Airway and No signs of Nausea or vomiting  Post vital signs: Reviewed and stable  Last Vitals:  Filed Vitals:   08/21/15 1000  BP: 149/73  Pulse: 56  Temp: 35.8 C  Resp: 16    Level of consciousness: awake, alert  and patient cooperative  Complications: No apparent anesthesia complications

## 2015-08-21 NOTE — Anesthesia Preprocedure Evaluation (Signed)
Anesthesia Evaluation  Patient identified by MRN, date of birth, ID band Patient awake    Reviewed: Allergy & Precautions, H&P , NPO status , Patient's Chart, lab work & pertinent test results, reviewed documented beta blocker date and time   Airway Mallampati: IV  TM Distance: >3 FB Neck ROM: full    Dental no notable dental hx. (+) Teeth Intact Top veneers:   Pulmonary shortness of breath and with exertion, asthma , neg sleep apnea, neg COPD, neg recent URI,    Pulmonary exam normal breath sounds clear to auscultation       Cardiovascular Exercise Tolerance: Good (-) hypertension(-) angina+ Peripheral Vascular Disease  (-) CAD, (-) Past MI, (-) Cardiac Stents and (-) CABG Normal cardiovascular exam(-) dysrhythmias + Valvular Problems/Murmurs MVP  Rhythm:regular Rate:Normal     Neuro/Psych  Headaches, neg Seizures TIAnegative psych ROS   GI/Hepatic negative GI ROS, Neg liver ROS,   Endo/Other  negative endocrine ROS  Renal/GU negative Renal ROS  negative genitourinary   Musculoskeletal   Abdominal   Peds  Hematology negative hematology ROS (+)   Anesthesia Other Findings Past Medical History:   Sarcoidosis ([place])                                            Asthma                                                       Irregular heartbeat                                          TIA (transient ischemic attack)                              Osteopenia                                                   Macular degeneration                                         Mitral valve prolapse                                        Heart murmur                                                 Shortness of breath dyspnea                                    Comment:with exertion  Arthritis                                                    Glaucoma (increased eye pressure)                            Headache                                                      Hyperlipidemia                                               Colon polyp                                     05/22/2013    Reproductive/Obstetrics negative OB ROS                             Anesthesia Physical Anesthesia Plan  ASA: III  Anesthesia Plan: General   Post-op Pain Management:    Induction:   Airway Management Planned:   Additional Equipment:   Intra-op Plan:   Post-operative Plan:   Informed Consent: I have reviewed the patients History and Physical, chart, labs and discussed the procedure including the risks, benefits and alternatives for the proposed anesthesia with the patient or authorized representative who has indicated his/her understanding and acceptance.   Dental Advisory Given  Plan Discussed with: Anesthesiologist, CRNA and Surgeon  Anesthesia Plan Comments:         Anesthesia Quick Evaluation

## 2015-08-25 LAB — SURGICAL PATHOLOGY

## 2015-09-04 ENCOUNTER — Inpatient Hospital Stay: Payer: Medicare Other | Attending: Internal Medicine | Admitting: Internal Medicine

## 2015-09-04 ENCOUNTER — Encounter: Payer: Self-pay | Admitting: Internal Medicine

## 2015-09-04 VITALS — BP 121/72 | HR 64 | Temp 97.8°F | Resp 18 | Ht 67.0 in | Wt 165.0 lb

## 2015-09-04 DIAGNOSIS — H3530 Unspecified macular degeneration: Secondary | ICD-10-CM | POA: Diagnosis not present

## 2015-09-04 DIAGNOSIS — Z9889 Other specified postprocedural states: Secondary | ICD-10-CM | POA: Insufficient documentation

## 2015-09-04 DIAGNOSIS — Z8673 Personal history of transient ischemic attack (TIA), and cerebral infarction without residual deficits: Secondary | ICD-10-CM | POA: Insufficient documentation

## 2015-09-04 DIAGNOSIS — E785 Hyperlipidemia, unspecified: Secondary | ICD-10-CM | POA: Insufficient documentation

## 2015-09-04 DIAGNOSIS — M8580 Other specified disorders of bone density and structure, unspecified site: Secondary | ICD-10-CM | POA: Diagnosis not present

## 2015-09-04 DIAGNOSIS — Z86010 Personal history of colonic polyps: Secondary | ICD-10-CM | POA: Insufficient documentation

## 2015-09-04 DIAGNOSIS — J45909 Unspecified asthma, uncomplicated: Secondary | ICD-10-CM | POA: Diagnosis not present

## 2015-09-04 DIAGNOSIS — C44500 Unspecified malignant neoplasm of anal skin: Secondary | ICD-10-CM

## 2015-09-04 DIAGNOSIS — C44520 Squamous cell carcinoma of anal skin: Secondary | ICD-10-CM | POA: Diagnosis not present

## 2015-09-04 DIAGNOSIS — I341 Nonrheumatic mitral (valve) prolapse: Secondary | ICD-10-CM | POA: Insufficient documentation

## 2015-09-04 DIAGNOSIS — Z7982 Long term (current) use of aspirin: Secondary | ICD-10-CM | POA: Insufficient documentation

## 2015-09-04 DIAGNOSIS — Z79899 Other long term (current) drug therapy: Secondary | ICD-10-CM | POA: Diagnosis not present

## 2015-09-04 NOTE — Progress Notes (Signed)
[place] OFFICE PROGRESS NOTE  Patient Care Team: Chrisandra Carota, MD as PCP - General (Family Medicine) Hayden Pedro, MD (Ophthalmology)   SUMMARY OF ONCOLOGIC HISTORY:  # SEP 2016- SCC of anal margin [s/p "external hemorroidectomy"] 1.6CM arising in high grade AIN-3; Positive margins; Re-Excision- 1-3' O clock- POSITIVE for AIN-G-3; PET 2016-rectal/anal uptake; No distant metastatic disease/lymphadenopathy.   # PET scan- cecal uptake-awaiting [facility]  # History of sarcoidosis- PET scan-calcified granulomas/mild hilar/mediastinal adenopathy.  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    Patient has intermittent pain in the perianal region. No discharge no signs of infection. Patient has an appointment with Dr. Tamala Julian this afternoon.  REVIEW OF SYSTEMS:  A complete 10 point review of system is done which is negative except mentioned above/history of present illness.   PAST MEDICAL HISTORY :  Past Medical History  Diagnosis Date  . Sarcoidosis ([place])   . Asthma   . Irregular heartbeat   . TIA (transient ischemic attack)   . Osteopenia   . Macular degeneration   . Mitral valve prolapse   . Heart murmur   . Shortness of breath dyspnea     with exertion  . Arthritis   . Glaucoma (increased eye pressure)   . Headache   . Hyperlipidemia   . Colon polyp 05/22/2013  . Cancer [facility])     anal    PAST SURGICAL HISTORY :   Past Surgical History  Procedure Laterality Date  . Hysterectomy with ovaries intact    . Cataract surgery  08/18/09    left eye  . Abdominal hysterectomy    . Eye surgery Bilateral     cataract extraction  . Tonsillectomy    . Dilation and curettage of uterus    . Hemorrhoid surgery N/A 07/24/2015    Procedure: HEMORRHOIDECTOMY/INTERNAL AND  EXTERNAL;  Surgeon: Leonie Green, MD;  Location: [facility] ORS;  Service: General;  Laterality: N/A;  . Colonoscopy  05/22/2013  . Mass excision N/A 08/21/2015    Procedure: EXCISION MASS/ ANAL MASS;  Surgeon: Leonie Green, MD;  Location: [facility] ORS;  Service: General;  Laterality: N/A;    FAMILY HISTORY :  No family history on file.  SOCIAL HISTORY:   Social History  Substance Use Topics  . Smoking status: Never Smoker   . Smokeless tobacco: Never Used  . Alcohol Use: No    ALLERGIES:  is allergic to aspirin; dairy aid; diazepam; and shellfish-derived products.  MEDICATIONS:  Current Outpatient Prescriptions  Medication Sig Dispense Refill  . ALPHAGAN P 0.15 % ophthalmic solution Place 2 drops into the left eye 2 (two) times daily.     Marland Kitchen aspirin EC 81 MG tablet Take 81 mg by mouth daily.    . beclomethasone (QVAR) 80 MCG/ACT inhaler Inhale 2 puffs into the lungs 2 (two) times daily. 3 Inhaler 3  . cholecalciferol (VITAMIN D) 1000 UNITS tablet Take 1,000 Units by mouth daily.     . Magnesium 400 MG TABS Take 1 tablet by mouth every morning.     Marland Kitchen MAXAIR AUTOHALER 200 MCG/INH inhaler Inhale 2 puffs into the lungs as needed.    . Multiple Vitamin (MULTIVITAMIN WITH MINERALS) TABS Take 1 tablet by mouth daily. Centrum Silver    . Multiple Vitamins-Minerals (PRESERVISION AREDS) CAPS Take 1  capsule by mouth daily.    Marland Kitchen omeprazole (PRILOSEC) 20 MG capsule Take 1 capsule by mouth 2 (two) times daily.  11  . potassium chloride (K-DUR) 10 MEQ tablet Take 20 mEq by mouth 2 (two) times daily.    . rosuvastatin (CRESTOR) 5 MG tablet Take 5 mg by mouth every morning.      No current facility-administered medications for this visit.    PHYSICAL EXAMINATION: ECOG PERFORMANCE STATUS: 0 - Asymptomatic  BP 121/72 mmHg  Pulse 64  Temp(Src) 97.8 F (36.6 C) (Tympanic)  Resp 18  Ht 5\' 7"  (1.702 m)  Wt 165 lb (74.844 kg)  BMI 25.84 kg/m2  Filed Weights   09/04/15 1012  Weight: 165  lb (74.844 kg)    GENERAL: Well-nourished well-developed; Alert, no distress and comfortable.   Accompanied by her daughter.    LABORATORY DATA:  I have reviewed the data as listed    Component Value Date/Time   NA 141 07/15/2015 1407   K 3.9 07/15/2015 1407   CL 101 07/15/2015 1407   CO2 31 07/15/2015 1407   GLUCOSE 92 07/15/2015 1407   BUN 15 07/15/2015 1407   CREATININE 0.63 07/15/2015 1407   CALCIUM 10.1 07/15/2015 1407   PROT 7.4 07/15/2015 1407   ALBUMIN 4.0 07/15/2015 1407   AST 18 07/15/2015 1407   ALT 17 07/15/2015 1407   ALKPHOS 69 07/15/2015 1407   BILITOT 0.5 07/15/2015 1407   GFRNONAA >60 07/15/2015 1407   GFRAA >60 07/15/2015 1407    No results found for: SPEP, UPEP  Lab Results  Component Value Date   WBC 6.6 07/15/2015   NEUTROABS 4.9 07/15/2015   HGB 13.3 07/15/2015   HCT 40.3 07/15/2015   MCV 96.2 07/15/2015   PLT 306 07/15/2015      Chemistry      Component Value Date/Time   NA 141 07/15/2015 1407   K 3.9 07/15/2015 1407   CL 101 07/15/2015 1407   CO2 31 07/15/2015 1407   BUN 15 07/15/2015 1407   CREATININE 0.63 07/15/2015 1407      Component Value Date/Time   CALCIUM 10.1 07/15/2015 1407   ALKPHOS 69 07/15/2015 1407   AST 18 07/15/2015 1407   ALT 17 07/15/2015 1407   BILITOT 0.5 07/15/2015 1407       RADIOGRAPHIC STUDIES: I have personally reviewed the radiological images as listed and agreed with the findings in the report. No results found.   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 I left a message for pathology/ and the surgeon Dr. Tamala Julian to discuss regarding the above.  # With regards to treatment, options will depend upon# final size of of the invasive cancer # or if reexcision is not possible.  Radiation would be recommended if reexcision is not possible. Question of concurrent chemotherapy- with Xeloda would be  based on final size of the tumor.   # Uptake in the cecal area- awaiting a colonoscopy/appointment with GI on 14 November.   No orders of the defined types were placed in this encounter.   All questions were answered. The patient knows to call the clinic with any problems, questions or concerns. No barriers to learning was detected.  75minutes counseling the patient face to face. The total time spent in the appointment was 30 minutes and more than 50% was on counseling and review of test results  Cammie Sickle, MD 09/04/2015 10:36 AM

## 2015-09-12 ENCOUNTER — Encounter: Payer: Self-pay | Admitting: *Deleted

## 2015-09-12 ENCOUNTER — Telehealth: Payer: Self-pay | Admitting: Internal Medicine

## 2015-09-12 NOTE — OR Nursing (Signed)
Note from Dr Saralyn Pilar Office clearing patient for hemorrhoidectomy   Formatting of this note may be different from the original. Established Patient Visit   Chief Complaint: Chief Complaint  Patient presents with  . Establish Care  est care and POC  Date of Service: 07/23/2015 Date of Birth: 1940-11-18 PCP: Loletha Carrow ANN Vickki Muff, MD  History of Present Illness: Lisa [name] is a 74 y.o.female patient who returns for preoperative cardiovascular evaluation prior to hemorrhoidectomy scheduled for 07/25/2015. The patient reports she did have an episode of chest discomfort 07/14/2015. She describes discomfort under her left breast, related to certain meals, called EMS, however, the patient did not seek any further medical attention. The patient has tried Prilosec, which has helped. She denies shortness of breath, palpitations, heart racing. She has mild intermittent peripheral edema. The patient reports she has had a history of gastroesophageal reflux disease and is sensitive to certain foods. She underwent ETT 09/17/2014 which did not reveal evidence for ischemia. Echocardiogram 05/13/2014 revealed LV ejection fraction 55% with mild tricuspid regurgitation.  The patient has hyperlipidemia, LDL cholesterol was 91 on 03/21/2015, currently on Crestor, which is well tolerated without apparent side effects, followed by her primary care provider. The patient follows a low-cholesterol, low-fat diet.  Past Medical and Surgical History  Past Medical History Past Medical History  Diagnosis Date  . Asthma without status asthmaticus  . Glaucoma (increased eye pressure)  . Headache 04/10/2014  . Hyperlipidemia  . Neck pain 04/10/2014  . No significant past medical history  . Sarcoidosis  . Sarcoidosis 05/06/2015   Past Surgical History She has a past surgical history that includes No past surgeries; Cataract extraction; Colonoscopy (2007); colonoscopy w/biopsy (05/22/2013); colonoscopy w/removal lesions by snare  (05/22/2013); lens eye surgery (Bilateral); and lens eye surgery (Bilateral).   Medications and Allergies  Current Medications  Current Outpatient Prescriptions  Medication Sig Dispense Refill  . aspirin 81 MG EC tablet Take 81 mg by mouth daily.  . brimonidine (ALPHAGAN P) 0.1 % ophthalmic solution Place 1 drop into the left eye 2 (two) times daily. 15 mL 11  . cholecalciferol (CHOLECALCIFEROL) 1,000 unit tablet Take 185 mg by mouth daily.  . CRESTOR 5 mg tablet Take 1 tablet (5 mg total) by mouth once daily. 90 tablet 3  . KLOR-CON M10 10 mEq ER tablet TAKE 2 TABLETS (20 MEQ TOTAL) BY MOUTH 2 (TWO) TIMES DAILY. 11  . lidocaine (LIDODERM) 5 % patch Place 1 patch onto the skin continuously as needed.   . loperamide (IMODIUM) 2 mg capsule Take 2 mg by mouth 4 (four) times daily as needed for Diarrhea.  . magnesium oxide (MAG-OX) 400 mg tablet Take 1 tablet (400 mg total) by mouth once daily. 30 tablet 10  . MULTIVITAMIN ORAL Take 1 tablet by mouth once daily.   Marland Kitchen omeprazole (PRILOSEC) 20 MG DR capsule Take 1 capsule (20 mg total) by mouth 2 (two) times daily. 60 capsule 11  . pirbuterol (MAXAIR) 200 mcg/Inhalation inhaler Inhale 2 inhalations into the lungs as needed.  Marland Kitchen QVAR 80 mcg/actuation inhaler Inhale 2 inhalations into the lungs 2 (two) times daily.   . sucralfate (CARAFATE) 100 mg/mL suspension Take 10 mLs (1 g total) by mouth 4 (four) times daily before meals and nightly. 400 mL 0  . VIT A/VIT C/VIT E/ZINC/COPPER (PRESERVISION AREDS ORAL) Take by mouth daily.  Marland Kitchen zolpidem (AMBIEN) 5 MG tablet Take 1 tablet (5 mg total) by mouth nightly as needed for Sleep (for insomnia, try to  use no more than 2-3 times per week). 30 tablet 2  . zolpidem (AMBIEN) 5 MG tablet Take 2.5 mg by mouth nightly as needed.    No current facility-administered medications for this visit.   Allergies: Valium [diazepam]  Social and Family History  Social History reports that she has never smoked. She has  never used smokeless tobacco. She reports that she does not drink alcohol or use illicit drugs.  Family History Family History  Problem Relation Age of Onset  . Coronary artery disease Father  . Asthma Father  . Prostate cancer Brother  . Cancer Mother  Unknown Female Organ  . Glaucoma Neg Hx  . Macular degeneration Neg Hx   Review of Systems   Review of Systems: The patient reports intermittent, nonexertional chest pain, without shortness of breath, orthopnea, paroxysmal nocturnal dyspnea, pedal edema, palpitations, heart racing, presyncope, syncope. Review of 12 Systems is negative except as described above.  Physical Examination   Vitals: Visit Vitals  . BP 112/80 (BP Location: Left upper arm, Patient Position: Sitting, BP Cuff Size: Adult)  . Pulse 68  . Ht 170.2 cm (5\' 7" )  . Wt 76.7 kg (169 lb)  . BMI 26.47 kg/m2   Ht:170.2 cm (5\' 7" ) Wt:76.7 kg (169 lb) [id number] surface area is 1.9 meters squared. Body mass index is 26.47 kg/(m^2).  HEENT: Pupils equally reactive to light and accomodation  Neck: Supple without thyromegaly, carotid pulses 2+ Lungs: clear to auscultation bilaterally; no wheezes, rales, rhonchi Heart: Regular rate and rhythm. No gallops, murmurs or rub Abdomen: soft nontender, nondistended, with normal bowel sounds Extremities: no cyanosis, clubbing, or edema Peripheral Pulses: 2+ in all extremities, 2+ femoral pulses bilaterally  Assessment   74 y.o. female with  1. Stenosis of right carotid artery  2. Near syncope  3. Hypercholesterolemia  4. Heart murmur  5. Chest pain with low risk for cardiac etiology   74 year old female with atypical chest pain, sounds more GI in nature, with prior negative ETT, known normal left ventricular function by previous echocardiogram. Patient's hyperlipidemia, with adequate control of LDL cholesterol, on Crestor. Patient awaiting hemorrhoidectomy, scheduled on 07/25/2015. The patient should be at low and acceptable  cardiovascular risk.  Plan   1. Continue current medications 2. Proceed with hemorrhoidectomy as schedule 3. Add omeprazole 20 mg b.i.d. 4. Counseled patient about low-sodium diet 5. Dash diet printed instructions given to the patient 6. Counseled patient about low-cholesterol diet 7. Continue Crestor for hyperlipidemia management 8. Low-fat and cholesterol diet printed instructions given to the patient 9. Return to clinic for follow-up in 6 months  No orders of the defined types were placed in this encounter.  Return in about 4 months (around 11/22/2015).  Isaias Cowman, MD

## 2015-09-12 NOTE — Telephone Encounter (Signed)
Spoke to Dr. Lisa Roca; regarding the reexcision. She will get back to me Monday after re-looking at the slides. Appreciate her input.

## 2015-09-15 ENCOUNTER — Encounter: Payer: Self-pay | Admitting: *Deleted

## 2015-09-15 ENCOUNTER — Ambulatory Visit: Payer: Medicare Other | Admitting: Anesthesiology

## 2015-09-15 ENCOUNTER — Encounter
Admission: RE | Disposition: A | Discharge status: Home / Self Care | Payer: Self-pay | Source: Ambulatory Visit | Attending: Gastroenterology

## 2015-09-15 ENCOUNTER — Ambulatory Visit
Admission: RE | Admit: 2015-09-15 | Discharge: 2015-09-15 | Disposition: A | Discharge status: Home / Self Care | Payer: Medicare Other | Source: Ambulatory Visit | Attending: Gastroenterology | Admitting: Gastroenterology

## 2015-09-15 DIAGNOSIS — J45909 Unspecified asthma, uncomplicated: Secondary | ICD-10-CM | POA: Diagnosis not present

## 2015-09-15 DIAGNOSIS — E7800 Pure hypercholesterolemia, unspecified: Secondary | ICD-10-CM | POA: Diagnosis not present

## 2015-09-15 DIAGNOSIS — H409 Unspecified glaucoma: Secondary | ICD-10-CM | POA: Diagnosis not present

## 2015-09-15 DIAGNOSIS — D869 Sarcoidosis, unspecified: Secondary | ICD-10-CM | POA: Diagnosis not present

## 2015-09-15 DIAGNOSIS — Z7982 Long term (current) use of aspirin: Secondary | ICD-10-CM | POA: Diagnosis not present

## 2015-09-15 DIAGNOSIS — D013 Carcinoma in situ of anus and anal canal: Secondary | ICD-10-CM | POA: Diagnosis present

## 2015-09-15 DIAGNOSIS — M19071 Primary osteoarthritis, right ankle and foot: Secondary | ICD-10-CM | POA: Diagnosis not present

## 2015-09-15 DIAGNOSIS — R011 Cardiac murmur, unspecified: Secondary | ICD-10-CM | POA: Diagnosis not present

## 2015-09-15 DIAGNOSIS — H3530 Unspecified macular degeneration: Secondary | ICD-10-CM | POA: Diagnosis not present

## 2015-09-15 DIAGNOSIS — Z79899 Other long term (current) drug therapy: Secondary | ICD-10-CM | POA: Diagnosis not present

## 2015-09-15 DIAGNOSIS — M170 Bilateral primary osteoarthritis of knee: Secondary | ICD-10-CM | POA: Diagnosis not present

## 2015-09-15 DIAGNOSIS — I6529 Occlusion and stenosis of unspecified carotid artery: Secondary | ICD-10-CM | POA: Diagnosis not present

## 2015-09-15 DIAGNOSIS — Z9889 Other specified postprocedural states: Secondary | ICD-10-CM | POA: Diagnosis not present

## 2015-09-15 HISTORY — DX: Pure hypercholesterolemia, unspecified: E78.00

## 2015-09-15 HISTORY — DX: Localized swelling, mass and lump, unspecified lower limb: R22.40

## 2015-09-15 HISTORY — DX: Unilateral primary osteoarthritis, unspecified knee: M17.10

## 2015-09-15 HISTORY — PX: COLONOSCOPY WITH PROPOFOL: SHX5780

## 2015-09-15 HISTORY — DX: Occlusion and stenosis of unspecified carotid artery: I65.29

## 2015-09-15 HISTORY — DX: Cervicalgia: M54.2

## 2015-09-15 SURGERY — COLONOSCOPY WITH PROPOFOL
Anesthesia: General

## 2015-09-15 MED ORDER — SODIUM CHLORIDE 0.9 % IV SOLN: INTRAVENOUS | Status: DC

## 2015-09-15 MED ORDER — PROPOFOL 500 MG/50ML IV EMUL
INTRAVENOUS | Status: DC | PRN
  Administered 2015-09-15: 120 ug/kg/min via INTRAVENOUS

## 2015-09-15 MED ORDER — SODIUM CHLORIDE 0.9 % IV SOLN
INTRAVENOUS | Status: DC
  Administered 2015-09-15 (×2): via INTRAVENOUS

## 2015-09-15 NOTE — Op Note (Signed)
[facility] Gastroenterology Patient Name: Lisa [name] Procedure Date: 09/15/2015 11:43 AM MRN: [id number] Account #: [id number] Date of Birth: 12-Jul-1941 Admit Type: Outpatient Age: 74 Room: [facility] ENDO ROOM 4 Gender: Female Note Status: Finalized Procedure:         Colonoscopy Indications:       Hx of anal cancer. Providers:         Lupita Dawn. Candace Cruise, MD Referring MD:      Forest Gleason Md, MD (Referring MD) Medicines:         Monitored Anesthesia Care Complications:     No immediate complications. Procedure:         Pre-Anesthesia Assessment:                    - Prior to the procedure, a History and Physical was                     performed, and patient medications, allergies and                     sensitivities were reviewed. The patient's tolerance of                     previous anesthesia was reviewed.                    - The risks and benefits of the procedure and the sedation                     options and risks were discussed with the patient. All                     questions were answered and informed consent was obtained.                    - After reviewing the risks and benefits, the patient was                     deemed in satisfactory condition to undergo the procedure.                    After obtaining informed consent, the colonoscope was                     passed under direct vision. Throughout the procedure, the                     patient's blood pressure, pulse, and oxygen saturations                     were monitored continuously. The Olympus CF-H180AL                     colonoscope ( S#: [id number] ) was introduced through the                     anus and advanced to the the cecum, identified by                     appendiceal orifice and ileocecal valve. Findings:      A few small-mouthed diverticula were found in the sigmoid colon. No       rectal involvement from anal cancer.      The perianal exam findings include small anal lesion.  The exam  was otherwise without abnormality. Impression:        - Diverticulosis in the sigmoid colon.                    - Small anal lesion found on perianal exam.                    - The examination was otherwise normal.                    - No specimens collected. Recommendation:    - Discharge patient to home.                    - The findings and recommendations were discussed with the                     patient. Procedure Code(s): --- Professional ---                    [phone], Colonoscopy, flexible; diagnostic, including                     collection of specimen(s) by brushing or washing, when                     performed (separate procedure) Diagnosis Code(s): --- Professional ---                    K57.30, Diverticulosis of large intestine without                     perforation or abscess without bleeding CPT copyright 2014 [employer] Association. All rights reserved. The codes documented in this report are preliminary and upon coder review may  be revised to meet current compliance requirements. Hulen Luster, MD 09/15/2015 12:03:52 PM This report has been signed electronically. Number of Addenda: 0 Note Initiated On: 09/15/2015 11:43 AM Scope Withdrawal Time: 0 hours 7 minutes 7 seconds  Total Procedure Duration: 0 hours 12 minutes 21 seconds       [facility]

## 2015-09-15 NOTE — Transfer of Care (Signed)
Immediate Anesthesia Transfer of Care Note  Patient: Lisa [name]  Procedure(s) Performed: Procedure(s): COLONOSCOPY WITH PROPOFOL (N/A)  Patient Location: PACU and Endoscopy Unit  Anesthesia Type:General  Level of Consciousness: sedated and responds to stimulation  Airway & Oxygen Therapy: Patient Spontanous Breathing and Patient connected to nasal cannula oxygen  Post-op Assessment: Report given to RN and Post -op Vital signs reviewed and stable  Post vital signs: Reviewed and stable  Last Vitals:  Filed Vitals:   09/15/15 1045 09/15/15 1205  BP: 159/71 113/61  Pulse: 63 69  Temp: 36.2 C 35.8 C  Resp: 14 22    Complications: No apparent anesthesia complications

## 2015-09-15 NOTE — H&P (Signed)
  Date of Initial H&P: 09/08/2015  History reviewed, patient examined, no change in status, stable for surgery.

## 2015-09-15 NOTE — Anesthesia Preprocedure Evaluation (Signed)
Anesthesia Evaluation  Patient identified by MRN, date of birth, ID band Patient awake    Reviewed: Allergy & Precautions, NPO status , Patient's Chart, lab work & pertinent test results  Airway Mallampati: II  TM Distance: >3 FB Neck ROM: Full    Dental no notable dental hx.  Top porcelain veneers:   Pulmonary shortness of breath and with exertion, asthma ,  Hx of sarcoid   Pulmonary exam normal breath sounds clear to auscultation       Cardiovascular + Peripheral Vascular Disease  Normal cardiovascular exam+ Valvular Problems/Murmurs   Carotid stenosis   Neuro/Psych  Headaches, TIAnegative psych ROS   GI/Hepatic negative GI ROS, Neg liver ROS, GERD  Medicated and Controlled,  Endo/Other  negative endocrine ROS  Renal/GU negative Renal ROS  negative genitourinary   Musculoskeletal  (+) Arthritis , Osteoarthritis,    Abdominal Normal abdominal exam  (+)   Peds negative pediatric ROS (+)  Hematology negative hematology ROS (+)   Anesthesia Other Findings   Reproductive/Obstetrics                             Anesthesia Physical Anesthesia Plan  ASA: III  Anesthesia Plan: General   Post-op Pain Management:    Induction: Intravenous  Airway Management Planned: Nasal Cannula  Additional Equipment:   Intra-op Plan:   Post-operative Plan:   Informed Consent: I have reviewed the patients History and Physical, chart, labs and discussed the procedure including the risks, benefits and alternatives for the proposed anesthesia with the patient or authorized representative who has indicated his/her understanding and acceptance.   Dental advisory given  Plan Discussed with: CRNA  Anesthesia Plan Comments:         Anesthesia Quick Evaluation

## 2015-09-16 ENCOUNTER — Encounter: Payer: Self-pay | Admitting: Anesthesiology

## 2015-09-16 ENCOUNTER — Ambulatory Visit
Admission: RE | Admit: 2015-09-16 | Discharge: 2015-09-16 | Disposition: A | Discharge status: Home / Self Care | Payer: Medicare Other | Source: Ambulatory Visit | Attending: Surgery | Admitting: Surgery

## 2015-09-16 ENCOUNTER — Encounter
Admission: RE | Disposition: A | Discharge status: Home / Self Care | Payer: Self-pay | Source: Ambulatory Visit | Attending: Surgery

## 2015-09-16 ENCOUNTER — Ambulatory Visit: Payer: Medicare Other | Admitting: Anesthesiology

## 2015-09-16 DIAGNOSIS — J45909 Unspecified asthma, uncomplicated: Secondary | ICD-10-CM | POA: Insufficient documentation

## 2015-09-16 DIAGNOSIS — I6529 Occlusion and stenosis of unspecified carotid artery: Secondary | ICD-10-CM | POA: Insufficient documentation

## 2015-09-16 DIAGNOSIS — Z7982 Long term (current) use of aspirin: Secondary | ICD-10-CM | POA: Insufficient documentation

## 2015-09-16 DIAGNOSIS — E7800 Pure hypercholesterolemia, unspecified: Secondary | ICD-10-CM | POA: Insufficient documentation

## 2015-09-16 DIAGNOSIS — H3530 Unspecified macular degeneration: Secondary | ICD-10-CM | POA: Insufficient documentation

## 2015-09-16 DIAGNOSIS — D013 Carcinoma in situ of anus and anal canal: Secondary | ICD-10-CM | POA: Insufficient documentation

## 2015-09-16 DIAGNOSIS — Z79899 Other long term (current) drug therapy: Secondary | ICD-10-CM | POA: Insufficient documentation

## 2015-09-16 DIAGNOSIS — Z9889 Other specified postprocedural states: Secondary | ICD-10-CM | POA: Insufficient documentation

## 2015-09-16 DIAGNOSIS — D869 Sarcoidosis, unspecified: Secondary | ICD-10-CM | POA: Insufficient documentation

## 2015-09-16 DIAGNOSIS — M19071 Primary osteoarthritis, right ankle and foot: Secondary | ICD-10-CM | POA: Insufficient documentation

## 2015-09-16 DIAGNOSIS — R011 Cardiac murmur, unspecified: Secondary | ICD-10-CM | POA: Insufficient documentation

## 2015-09-16 DIAGNOSIS — H409 Unspecified glaucoma: Secondary | ICD-10-CM | POA: Insufficient documentation

## 2015-09-16 DIAGNOSIS — M170 Bilateral primary osteoarthritis of knee: Secondary | ICD-10-CM | POA: Insufficient documentation

## 2015-09-16 HISTORY — PX: TUMOR EXCISION: SHX421

## 2015-09-16 SURGERY — EXCISION, NEOPLASM, RECTUM
Anesthesia: General | Wound class: Clean Contaminated

## 2015-09-16 MED ORDER — PROPOFOL 10 MG/ML IV BOLUS
INTRAVENOUS | Status: DC | PRN
  Administered 2015-09-16: 120 mg via INTRAVENOUS

## 2015-09-16 MED ORDER — FENTANYL CITRATE (PF) 100 MCG/2ML IJ SOLN: 25.0000 ug | INTRAMUSCULAR | Status: DC | PRN

## 2015-09-16 MED ORDER — BUPIVACAINE-EPINEPHRINE (PF) 0.5% -1:200000 IJ SOLN
INTRAMUSCULAR | Status: DC | PRN
  Administered 2015-09-16: 17 mL

## 2015-09-16 MED ORDER — OXYCODONE HCL 5 MG PO TABS: 5.0000 mg | ORAL_TABLET | Freq: Once | ORAL | Status: DC | PRN

## 2015-09-16 MED ORDER — FENTANYL CITRATE (PF) 100 MCG/2ML IJ SOLN
INTRAMUSCULAR | Status: DC | PRN
  Administered 2015-09-16 (×2): 50 ug via INTRAVENOUS

## 2015-09-16 MED ORDER — LACTATED RINGERS IV SOLN
INTRAVENOUS | Status: DC
  Administered 2015-09-16 (×2): via INTRAVENOUS

## 2015-09-16 MED ORDER — LIDOCAINE HCL (CARDIAC) 20 MG/ML IV SOLN
INTRAVENOUS | Status: DC | PRN
  Administered 2015-09-16: 60 mg via INTRAVENOUS

## 2015-09-16 MED ORDER — OXYCODONE HCL 5 MG/5ML PO SOLN: 5.0000 mg | Freq: Once | ORAL | Status: DC | PRN

## 2015-09-16 MED ORDER — ONDANSETRON HCL 4 MG/2ML IJ SOLN
INTRAMUSCULAR | Status: DC | PRN
  Administered 2015-09-16: 4 mg via INTRAVENOUS

## 2015-09-16 MED ORDER — BUPIVACAINE-EPINEPHRINE (PF) 0.5% -1:200000 IJ SOLN
INTRAMUSCULAR | Status: AC
  Filled 2015-09-16: qty 30

## 2015-09-16 SURGICAL SUPPLY — 32 items
CANISTER SUCT 1200ML W/VALVE (MISCELLANEOUS) ×3 IMPLANT
DRAPE LAPAROTOMY 100X77 ABD (DRAPES) ×3 IMPLANT
DRAPE LEGGINS SURG 28X43 STRL (DRAPES) ×3 IMPLANT
DRAPE UNDER BUTTOCK W/FLU (DRAPES) ×3 IMPLANT
DRESSING TELFA 4X3 1S ST N-ADH (GAUZE/BANDAGES/DRESSINGS) ×2 IMPLANT
GAUZE SPONGE 4X4 12PLY STRL (GAUZE/BANDAGES/DRESSINGS) ×3 IMPLANT
GLOVE BIO SURGEON STRL SZ7.5 (GLOVE) ×3 IMPLANT
GOWN STRL REUS W/ TWL LRG LVL3 (GOWN DISPOSABLE) ×3 IMPLANT
GOWN STRL REUS W/TWL LRG LVL3 (GOWN DISPOSABLE) ×9
KIT RM TURNOVER CYSTO AR (KITS) ×3 IMPLANT
LABEL OR SOLS (LABEL) ×3 IMPLANT
NDL HYPO 25X1 1.5 SAFETY (NEEDLE) ×1 IMPLANT
NEEDLE HYPO 25X1 1.5 SAFETY (NEEDLE) ×3 IMPLANT
NS IRRIG 500ML POUR BTL (IV SOLUTION) ×3 IMPLANT
PACK BASIN MINOR ARMC (MISCELLANEOUS) ×3 IMPLANT
PAD ABD DERMACEA PRESS 5X9 (GAUZE/BANDAGES/DRESSINGS) ×3 IMPLANT
PAD GROUND ADULT SPLIT (MISCELLANEOUS) ×3 IMPLANT
PAD PREP 24X41 OB/GYN DISP (PERSONAL CARE ITEMS) ×3 IMPLANT
SHEARS HARMONIC STRL 23CM (MISCELLANEOUS) IMPLANT
SOL PREP PVP 2OZ (MISCELLANEOUS) ×3
SOLUTION PREP PVP 2OZ (MISCELLANEOUS) ×1 IMPLANT
STAPLER PROXIMATE HCS (STAPLE) IMPLANT
SURGILUBE 2OZ TUBE FLIPTOP (MISCELLANEOUS) ×3 IMPLANT
SUT CHROMIC 0 SH (SUTURE) IMPLANT
SUT CHROMIC 3 0 SH 27 (SUTURE) ×3 IMPLANT
SUT ETHILON 3-0 FS-10 30 BLK (SUTURE)
SUT ETHILON 4-0 (SUTURE) ×3
SUT ETHILON 4-0 FS2 18XMFL BLK (SUTURE) ×1
SUT PROLENE 2 0 SH DA (SUTURE) IMPLANT
SUTURE EHLN 3-0 FS-10 30 BLK (SUTURE) IMPLANT
SUTURE ETHLN 4-0 FS2 18XMF BLK (SUTURE) IMPLANT
SYRINGE 10CC LL (SYRINGE) ×3 IMPLANT

## 2015-09-16 NOTE — Anesthesia Postprocedure Evaluation (Signed)
Anesthesia Post Note  Patient: Lisa [name]  Procedure(s) Performed: Procedure(s) (LRB): TUMOR EXCISION RECTAL (N/A)  Patient location during evaluation: PACU Anesthesia Type: General Level of consciousness: awake and alert Pain management: pain level controlled Vital Signs Assessment: post-procedure vital signs reviewed and stable Respiratory status: spontaneous breathing, nonlabored ventilation, respiratory function stable and patient connected to nasal cannula oxygen Cardiovascular status: blood pressure returned to baseline and stable Postop Assessment: No signs of nausea or vomiting Anesthetic complications: no    Last Vitals:  Filed Vitals:   09/16/15 1245 09/16/15 1252  BP: 137/77 152/65  Pulse: 63 55  Temp:  37 C  Resp: 20 18    Last Pain: There were no vitals filed for this visit.               Precious Haws Allie Ousley

## 2015-09-16 NOTE — Transfer of Care (Signed)
Immediate Anesthesia Transfer of Care Note  Patient: Lisa [name]  Procedure(s) Performed: Procedure(s): TUMOR EXCISION RECTAL (N/A)  Patient Location: PACU  Anesthesia Type:General  Level of Consciousness: awake, alert  and oriented  Airway & Oxygen Therapy: Patient Spontanous Breathing  Post-op Assessment: Report given to RN and Post -op Vital signs reviewed and stable  Post vital signs: Reviewed and stable  Last Vitals:  Filed Vitals:   09/16/15 0932 09/16/15 1205  BP: 140/67 136/63  Pulse: 60 65  Temp: 36.7 C 36.3 C  Resp: 16 15    Complications: No apparent anesthesia complications

## 2015-09-16 NOTE — Anesthesia Preprocedure Evaluation (Addendum)
Anesthesia Evaluation  Patient identified by MRN, date of birth, ID band Patient awake    Reviewed: Allergy & Precautions, H&P , NPO status , Patient's Chart, lab work & pertinent test results  History of Anesthesia Complications Negative for: history of anesthetic complications  Airway Mallampati: III  TM Distance: >3 FB Neck ROM: limited    Dental no notable dental hx. (+) Poor Dentition, Chipped   Pulmonary neg shortness of breath, asthma ,    Pulmonary exam normal breath sounds clear to auscultation       Cardiovascular Exercise Tolerance: Good (-) angina+ Peripheral Vascular Disease  (-) Past MI and (-) DOE Normal cardiovascular exam+ Valvular Problems/Murmurs MVP  Rhythm:regular Rate:Normal     Neuro/Psych  Headaches, TIAnegative psych ROS   GI/Hepatic negative GI ROS, Neg liver ROS, neg GERD  ,  Endo/Other  negative endocrine ROS  Renal/GU negative Renal ROS  negative genitourinary   Musculoskeletal  (+) Arthritis ,   Abdominal   Peds  Hematology negative hematology ROS (+)   Anesthesia Other Findings Past Medical History:   Sarcoidosis ([place])                                            Asthma                                                       Irregular heartbeat                                          TIA (transient ischemic attack)                              Osteopenia                                                   Macular degeneration                                         Mitral valve prolapse                                        Heart murmur                                                 Shortness of breath dyspnea                                    Comment:with exertion   Arthritis  Glaucoma (increased eye pressure)                            Headache                                                     Hyperlipidemia                                                Colon polyp                                     05/22/2013    Cancer (HCC)                                                   Comment:anal   Neck pain                                                    Sarcoidosis ([place])                                            Localized primary osteoarthritis of lower leg                Hypercholesterolemia                                         Carotid stenosis                                             Foot mass                                                   Past Surgical History:   hysterectomy with ovaries intact                              cataract surgery                                 08/18/09       Comment:left eye   ABDOMINAL HYSTERECTOMY  EYE SURGERY                                     Bilateral                Comment:cataract extraction   TONSILLECTOMY                                                 DILATION AND CURETTAGE OF UTERUS                              HEMORRHOID SURGERY                              N/A 07/24/2015      Comment:Procedure: HEMORRHOIDECTOMY/INTERNAL AND               EXTERNAL;  Surgeon: Leonie Green, MD;                Location: [facility] ORS;  Service: General;                Laterality: N/A;   COLONOSCOPY                                      05/22/2013    MASS EXCISION                                   N/A 08/21/2015     Comment:Procedure: EXCISION MASS/ ANAL MASS;  Surgeon:               Leonie Green, MD;  Location: [facility] ORS;                Service: General;  Laterality: N/A;  BMI    Body Mass Index   25.83 kg/m 2      Reproductive/Obstetrics negative OB ROS                             Anesthesia Physical Anesthesia Plan  ASA: III  Anesthesia Plan: General LMA   Post-op Pain Management:    Induction:   Airway Management Planned:   Additional Equipment:   Intra-op Plan:   Post-operative Plan:    Informed Consent: I have reviewed the patients History and Physical, chart, labs and discussed the procedure including the risks, benefits and alternatives for the proposed anesthesia with the patient or authorized representative who has indicated his/her understanding and acceptance.   Dental Advisory Given  Plan Discussed with: Anesthesiologist, CRNA and Surgeon  Anesthesia Plan Comments:        Anesthesia Quick Evaluation

## 2015-09-16 NOTE — Op Note (Signed)
OPERATIVE REPORT  PREOPERATIVE  DIAGNOSIS: . 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  PROCEDURE: . 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  ANESTHESIA:  General  SURGEON: Rochel Brome  MD   INDICATIONS: . She recently had a hemorrhoidectomy at the 5 to 6:00 position of the anus with findings of a 1 mm focus of invasive squamous cell carcinoma in a larger area of squamous cell carcinoma in situ. Reexcision had a margin involvement at 12:00 and 3:00 position. Reexcision was recommended for further treatment.  The patient was placed on the operating table in the supine position under general anesthesia. Legs were elevated into the lithotomy position using ankle straps. The anal area was prepared with Betadine solution and draped with sterile towels and sheets.  Digital examination demonstrated good patency of the anal canal and no palpable mass. The anal canal was dilated large enough to admit 4 fingers. The bivalve anal retractor was introduced. The site of the previous excision was identified at the 5 to 6:00 position. The skin and subcutaneous tissues were infiltrated with half percent Sensorcaine with epinephrine. The reexcision was carried out which extended from the 5:00 to 6:00 position extended up into the anal canal to include mucosa and extended posteriorly approximately 4 cm and included a 1 cm width of skin all of which would include the 12 to 3:00 position of the previous excision. This was excised in the subcutaneous plane and also care was made to avoid injury to the sphincter. This was placed on Telfa and put a suture in the 12:00 position and the 3:00 position. The 2 sutures were approximately 3 cm apart the Telfa was marked 12 and also marked 3. This was submitted fresh for pathology. The pathologist examined the specimen and recommended routine pathology. The wound was inspected and  several small bleeding points were cauterized hemostasis subsequently appeared to be intact. The wound was closed with interrupted inverted 3-0 chromic subcuticular sutures and left an opening large enough for drainage. Dressings were applied using 4 x 4 cotton gauze and 2 inch paper tape  The patient tolerated the procedure satisfactorily and was prepared for transfer to the recovery room  [employer].D.

## 2015-09-16 NOTE — Progress Notes (Signed)
Pt advises preop that she does not want to see her daughter until after the surgery is complete

## 2015-09-16 NOTE — Discharge Instructions (Signed)
Take Tylenol or Norco if needed for pain. Remove dressing later today or tomorrow. May shower and/or sit in warm water. Talk gauze or pad in underwear and change as needed for drainage.

## 2015-09-16 NOTE — H&P (Signed)
  She reports no change in condition since the day of the office visit. She has been moving her bowels satisfactorily. She had colonoscopy yesterday with findings of no polyps or tumors.  I discussed the plan for reexcision of squamous cell carcinoma of the anus

## 2015-09-19 NOTE — Anesthesia Postprocedure Evaluation (Signed)
Anesthesia Post Note  Patient: Lisa [name]  Procedure(s) Performed: Procedure(s) (LRB): COLONOSCOPY WITH PROPOFOL (N/A)  Patient location during evaluation: Endoscopy Anesthesia Type: General Level of consciousness: awake, awake and alert and oriented Pain management: pain level controlled Vital Signs Assessment: post-procedure vital signs reviewed and stable Respiratory status: spontaneous breathing Cardiovascular status: blood pressure returned to baseline Anesthetic complications: no    Last Vitals:  Filed Vitals:   09/15/15 1225 09/15/15 1235  BP: 128/74 108/95  Pulse: 60 57  Temp:    Resp: 14 15    Last Pain: There were no vitals filed for this visit.               Senon Nixon

## 2015-09-25 ENCOUNTER — Encounter: Payer: Self-pay | Admitting: Internal Medicine

## 2015-09-25 ENCOUNTER — Inpatient Hospital Stay: Payer: Medicare Other | Attending: Internal Medicine | Admitting: Internal Medicine

## 2015-09-25 VITALS — BP 133/79 | HR 69 | Temp 97.8°F | Wt 165.6 lb

## 2015-09-25 DIAGNOSIS — Z7982 Long term (current) use of aspirin: Secondary | ICD-10-CM | POA: Diagnosis not present

## 2015-09-25 DIAGNOSIS — J45909 Unspecified asthma, uncomplicated: Secondary | ICD-10-CM | POA: Diagnosis not present

## 2015-09-25 DIAGNOSIS — M8580 Other specified disorders of bone density and structure, unspecified site: Secondary | ICD-10-CM | POA: Diagnosis not present

## 2015-09-25 DIAGNOSIS — C210 Malignant neoplasm of anus, unspecified: Secondary | ICD-10-CM | POA: Diagnosis not present

## 2015-09-25 DIAGNOSIS — Z79899 Other long term (current) drug therapy: Secondary | ICD-10-CM | POA: Diagnosis not present

## 2015-09-25 DIAGNOSIS — I341 Nonrheumatic mitral (valve) prolapse: Secondary | ICD-10-CM | POA: Insufficient documentation

## 2015-09-25 DIAGNOSIS — Z8673 Personal history of transient ischemic attack (TIA), and cerebral infarction without residual deficits: Secondary | ICD-10-CM | POA: Insufficient documentation

## 2015-09-25 DIAGNOSIS — Z86010 Personal history of colonic polyps: Secondary | ICD-10-CM | POA: Insufficient documentation

## 2015-09-25 DIAGNOSIS — E7800 Pure hypercholesterolemia, unspecified: Secondary | ICD-10-CM | POA: Diagnosis not present

## 2015-09-25 DIAGNOSIS — H3530 Unspecified macular degeneration: Secondary | ICD-10-CM | POA: Insufficient documentation

## 2015-09-25 NOTE — Progress Notes (Signed)
.Lisa [name] OFFICE PROGRESS NOTE  Patient Care Team: Chrisandra Carota, MD as PCP - General (Family Medicine) Hayden Pedro, MD (Ophthalmology)   SUMMARY OF ONCOLOGIC HISTORY:  # SEP 2016- SCC of anal margin- STAGE I [s/p "external hemorroidectomy"] 1.6CM arising in high grade AIN-3; Positive margins; OCT 24th- Re-Excision- 1-3' O clock- POSITIVE for AIN-G-3;NOV 28th-RE-Excision- Margins POS DYSPLASIA. PET 2016-rectal/anal uptake; No distant metastatic disease/lymphadenopathy.   # History of sarcoidosis- PET scan-calcified granulomas/mild hilar/mediastinal adenopathy; # Colo [Nov 2016; Dr.Oh]-negative for any lesions.   INTERVAL HISTORY:  A pleasant 74 year old female patient with above history of squamous cell carcinoma of the anal margin status post external hemorrhoidectomy with positive margins has underwent reexcision on Nov 28th is here for follow-up.    In the interim patient also had- colonoscopy for the cecal uptake that was noted on the PET scan.  Patient has intermittent pain in the perianal region. No discharge no signs of infection. Patient has an appointment with Dr. Tamala Julian on December 7.  REVIEW OF SYSTEMS:  A complete 10 point review of system is done which is negative except mentioned above/history of present illness.   PAST MEDICAL HISTORY :  Past Medical History  Diagnosis Date  . Sarcoidosis ([place])   . Asthma   . Irregular heartbeat   . TIA (transient ischemic attack)   . Osteopenia   . Macular degeneration   . Mitral valve prolapse   . Heart murmur   . Shortness of breath dyspnea     with exertion  . Arthritis   . Glaucoma (increased eye pressure)   . Headache   . Hyperlipidemia   . Colon polyp 05/22/2013  . Cancer (HCC)     anal  . Neck pain   . Sarcoidosis ([place])   . Localized primary osteoarthritis of lower leg   . Hypercholesterolemia   . Carotid stenosis   . Foot mass     PAST SURGICAL HISTORY :   Past Surgical History   Procedure Laterality Date  . Hysterectomy with ovaries intact    . Cataract surgery  08/18/09    left eye  . Abdominal hysterectomy    . Eye surgery Bilateral     cataract extraction  . Tonsillectomy    . Dilation and curettage of uterus    . Hemorrhoid surgery N/A 07/24/2015    Procedure: HEMORRHOIDECTOMY/INTERNAL AND EXTERNAL;  Surgeon: Leonie Green, MD;  Location: [facility] ORS;  Service: General;  Laterality: N/A;  . Colonoscopy  05/22/2013  . Mass excision N/A 08/21/2015    Procedure: EXCISION MASS/ ANAL MASS;  Surgeon: Leonie Green, MD;  Location: [facility] ORS;  Service: General;  Laterality: N/A;  . Colonoscopy with propofol N/A 09/15/2015    Procedure: COLONOSCOPY WITH PROPOFOL;  Surgeon: Hulen Luster, MD;  Location: [facility] ENDOSCOPY;  Service: Gastroenterology;  Laterality: N/A;  . Tumor excision N/A 09/16/2015    Procedure: TUMOR EXCISION RECTAL;  Surgeon: Leonie Green, MD;  Location: [facility] ORS;  Service: General;  Laterality: N/A;    FAMILY HISTORY :  History reviewed. No pertinent family history.  SOCIAL HISTORY:   Social History  Substance Use Topics  . Smoking status: Never Smoker   . Smokeless tobacco: Never Used  . Alcohol Use: No    ALLERGIES:  is allergic to aspirin; dairy aid; diazepam; and shellfish-derived products.  MEDICATIONS:  Current Outpatient Prescriptions  Medication Sig Dispense Refill  . ALPHAGAN P 0.15 % ophthalmic solution Place 2  drops into the left eye 2 (two) times daily.     Marland Kitchen aspirin EC 81 MG tablet Take 81 mg by mouth daily.    . beclomethasone (QVAR) 80 MCG/ACT inhaler Inhale 2 puffs into the lungs 2 (two) times daily. 3 Inhaler 3  . cholecalciferol (VITAMIN D) 1000 UNITS tablet Take 1,000 Units by mouth daily.     Marland Kitchen lidocaine (LIDODERM) 5 % Place 1 patch onto the skin daily as needed. Remove & Discard patch within 12 hours or as directed by MD    . loperamide (IMODIUM) 2 MG capsule Take by mouth as needed for diarrhea or loose  stools.    . Magnesium 400 MG TABS Take 1 tablet by mouth every morning.     Marland Kitchen MAXAIR AUTOHALER 200 MCG/INH inhaler Inhale 2 puffs into the lungs as needed.    . Multiple Vitamin (MULTIVITAMIN WITH MINERALS) TABS Take 1 tablet by mouth daily. Centrum Silver    . Multiple Vitamins-Minerals (PRESERVISION AREDS) CAPS Take 1 capsule by mouth daily.    Marland Kitchen omeprazole (PRILOSEC) 20 MG capsule Take 1 capsule by mouth 2 (two) times daily.  11  . potassium chloride (K-DUR) 10 MEQ tablet Take 20 mEq by mouth 2 (two) times daily.    . rosuvastatin (CRESTOR) 5 MG tablet Take 5 mg by mouth every morning.     . sucralfate (CARAFATE) 1 GM/10ML suspension Take 1 g by mouth 4 (four) times daily -  with meals and at bedtime.    Marland Kitchen zolpidem (AMBIEN) 5 MG tablet Take 5 mg by mouth at bedtime as needed for sleep.     No current facility-administered medications for this visit.    PHYSICAL EXAMINATION: ECOG PERFORMANCE STATUS: 0 - Asymptomatic  BP 133/79 mmHg  Pulse 69  Temp(Src) 97.8 F (36.6 C) (Tympanic)  Wt 165 lb 9.1 oz (75.1 kg)  Filed Weights   09/25/15 0922  Weight: 165 lb 9.1 oz (75.1 kg)    GENERAL: Well-nourished well-developed; Alert, no distress and comfortable.   Accompanied by her daughter.    LABORATORY DATA:  I have reviewed the data as listed    Component Value Date/Time   NA 141 07/15/2015 1407   K 3.9 07/15/2015 1407   CL 101 07/15/2015 1407   CO2 31 07/15/2015 1407   GLUCOSE 92 07/15/2015 1407   BUN 15 07/15/2015 1407   CREATININE 0.63 07/15/2015 1407   CALCIUM 10.1 07/15/2015 1407   PROT 7.4 07/15/2015 1407   ALBUMIN 4.0 07/15/2015 1407   AST 18 07/15/2015 1407   ALT 17 07/15/2015 1407   ALKPHOS 69 07/15/2015 1407   BILITOT 0.5 07/15/2015 1407   GFRNONAA >60 07/15/2015 1407   GFRAA >60 07/15/2015 1407    No results found for: SPEP, UPEP  Lab Results  Component Value Date   WBC 6.6 07/15/2015   NEUTROABS 4.9 07/15/2015   HGB 13.3 07/15/2015   HCT 40.3  07/15/2015   MCV 96.2 07/15/2015   PLT 306 07/15/2015      Chemistry      Component Value Date/Time   NA 141 07/15/2015 1407   K 3.9 07/15/2015 1407   CL 101 07/15/2015 1407   CO2 31 07/15/2015 1407   BUN 15 07/15/2015 1407   CREATININE 0.63 07/15/2015 1407      Component Value Date/Time   CALCIUM 10.1 07/15/2015 1407   ALKPHOS 69 07/15/2015 1407   AST 18 07/15/2015 1407   ALT 17 07/15/2015 1407   BILITOT 0.5  07/15/2015 1407       RADIOGRAPHIC STUDIES: I have personally reviewed the radiological images as listed and agreed with the findings in the report. No results found.   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 I had discussed with Dr. Luana Shu and pathology- the final invasive cancer size was 1.6 cm; however the most recent reexcision margin is still positive for dysplasia.  # I left a message for Dr. Tamala Julian to discuss the next treatment plan; patient is agreeable for reexcision if that's needed. I also tried to reach Dr. Donella Stade for his accommodations. From chemotherapy standpoint- I do not think patient would benefit from chemotherapy [even along with radiation] given stage I anal cancer.  # Will present the case at the tumor conference next week.    # Patient follow-up in approximately 2 weeks. No labs needed   All questions were answered. The patient knows to call the clinic with any problems, questions or concerns. No barriers to learning was detected.  51minutes counseling the patient face to face. The total time spent in the appointment was 30 minutes and more than 50% was on counseling and review of test results     Cammie Sickle, MD 09/25/2015 9:57 AM

## 2015-09-30 ENCOUNTER — Institutional Professional Consult (permissible substitution): Payer: Medicare Other | Admitting: Radiation Oncology

## 2015-10-06 ENCOUNTER — Encounter: Payer: Self-pay | Admitting: Radiation Oncology

## 2015-10-06 ENCOUNTER — Ambulatory Visit
Admission: RE | Admit: 2015-10-06 | Discharge: 2015-10-06 | Disposition: A | Discharge status: Home / Self Care | Payer: Medicare Other | Source: Ambulatory Visit | Attending: Radiation Oncology | Admitting: Radiation Oncology

## 2015-10-06 VITALS — BP 130/77 | HR 76 | Temp 96.9°F | Resp 20 | Wt 166.8 lb

## 2015-10-06 DIAGNOSIS — C210 Malignant neoplasm of anus, unspecified: Secondary | ICD-10-CM

## 2015-10-06 NOTE — Consult Note (Signed)
Except an outstanding is perfect of Radiation Oncology NEW PATIENT EVALUATION  Name: Lisa [name]  MRN: [id number]  Date:   10/06/2015     DOB: 02/14/1941   This 74 y.o. female patient presents to the clinic for initial evaluation of anal cancer.  REFERRING PHYSICIAN: Chrisandra Carota, MD  CHIEF COMPLAINT:  Chief Complaint  Patient presents with  . Cancer    Pt is here for initial consulation of anal cancer    DIAGNOSIS: The encounter diagnosis was Anal cancer ([place]).   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 She eventually underwent to other re-excisions again showing high-grade squamous intraepithelial lesions with dysplasia present at the surgical margin. She has done well postoperatively. Is having no pain on bowel movements. No blood per rectum. She's been evaluated by medical oncology who has declined chemotherapy intervention. She was discussed at our weekly tumor conference. My opinion was to continue to observe would not trigger radiation treatments at this time. Her PET/CT scan showed only minimal hypermetabolic activity in the anal canal no evidence to suggest hypermetabolic activity in the inguinal region She's here today for formal opinion.  PLANNED TREATMENT REGIMEN: Observation  PAST MEDICAL HISTORY:  has a past medical history of Sarcoidosis ([place]); Asthma; Irregular heartbeat; TIA (transient ischemic attack); Osteopenia; Macular degeneration; Mitral valve prolapse; Heart murmur; Shortness of breath dyspnea; Arthritis; Glaucoma (increased eye pressure); Headache; Hyperlipidemia; Colon polyp (05/22/2013); Cancer ([place]); Neck pain; Sarcoidosis ([place]); Localized primary  osteoarthritis of lower leg; Hypercholesterolemia; Carotid stenosis; and Foot mass.    PAST SURGICAL HISTORY:  Past Surgical History  Procedure Laterality Date  . Hysterectomy with ovaries intact    . Cataract surgery  08/18/09    left eye  . Abdominal hysterectomy    . Eye surgery Bilateral     cataract extraction  . Tonsillectomy    . Dilation and curettage of uterus    . Hemorrhoid surgery N/A 07/24/2015    Procedure: HEMORRHOIDECTOMY/INTERNAL AND EXTERNAL;  Surgeon: Leonie Green, MD;  Location: [facility] ORS;  Service: General;  Laterality: N/A;  . Colonoscopy  05/22/2013  . Mass excision N/A 08/21/2015    Procedure: EXCISION MASS/ ANAL MASS;  Surgeon: Leonie Green, MD;  Location: [facility] ORS;  Service: General;  Laterality: N/A;  . Colonoscopy with propofol N/A 09/15/2015    Procedure: COLONOSCOPY WITH PROPOFOL;  Surgeon: Hulen Luster, MD;  Location: [facility] ENDOSCOPY;  Service: Gastroenterology;  Laterality: N/A;  . Tumor excision N/A 09/16/2015    Procedure: TUMOR EXCISION RECTAL;  Surgeon: Leonie Green, MD;  Location: [facility] ORS;  Service: General;  Laterality: N/A;    FAMILY HISTORY: family history is not on file.  SOCIAL HISTORY:  reports that she has never smoked. She has never used smokeless tobacco. She reports that she does not drink alcohol or use illicit drugs.  ALLERGIES: Aspirin; Dairy aid; Diazepam; and Shellfish-derived products  MEDICATIONS:  Current Outpatient Prescriptions  Medication Sig Dispense Refill  . ALPHAGAN P 0.15 % ophthalmic solution Place 2 drops into the left eye 2 (two) times daily.     Marland Kitchen aspirin EC 81 MG tablet Take 81 mg by mouth daily.    . beclomethasone (QVAR) 80 MCG/ACT inhaler Inhale 2 puffs into the  lungs 2 (two) times daily. 3 Inhaler 3  . cholecalciferol (VITAMIN D) 1000 UNITS tablet Take 1,000 Units by mouth daily.     Marland Kitchen lidocaine (LIDODERM) 5 % Place 1 patch onto the skin daily as needed. Remove & Discard patch within 12 hours  or as directed by MD    . loperamide (IMODIUM) 2 MG capsule Take by mouth as needed for diarrhea or loose stools.    . Magnesium 400 MG TABS Take 1 tablet by mouth every morning.     Marland Kitchen MAXAIR AUTOHALER 200 MCG/INH inhaler Inhale 2 puffs into the lungs as needed.    . Multiple Vitamin (MULTIVITAMIN WITH MINERALS) TABS Take 1 tablet by mouth daily. Centrum Silver    . Multiple Vitamins-Minerals (PRESERVISION AREDS) CAPS Take 1 capsule by mouth daily.    Marland Kitchen omeprazole (PRILOSEC) 20 MG capsule Take 1 capsule by mouth 2 (two) times daily.  11  . potassium chloride (K-DUR) 10 MEQ tablet Take 20 mEq by mouth 2 (two) times daily.    . rosuvastatin (CRESTOR) 5 MG tablet Take 5 mg by mouth every morning.     . sucralfate (CARAFATE) 1 GM/10ML suspension Take 1 g by mouth 4 (four) times daily -  with meals and at bedtime.    Marland Kitchen zolpidem (AMBIEN) 5 MG tablet Take 5 mg by mouth at bedtime as needed for sleep.     No current facility-administered medications for this encounter.    ECOG PERFORMANCE STATUS:  0 - Asymptomatic  REVIEW OF SYSTEMS:  Patient denies any weight loss, fatigue, weakness, fever, chills or night sweats. Patient denies any loss of vision, blurred vision. Patient denies any ringing  of the ears or hearing loss. No irregular heartbeat. Patient denies heart murmur or history of fainting. Patient denies any chest pain or pain radiating to her upper extremities. Patient denies any shortness of breath, difficulty breathing at night, cough or hemoptysis. Patient denies any swelling in the lower legs. Patient denies any nausea vomiting, vomiting of blood, or coffee ground material in the vomitus. Patient denies any stomach pain. Patient states has had normal bowel movements no significant constipation or diarrhea. Patient denies any dysuria, hematuria or significant nocturia. Patient denies any problems walking, swelling in the joints or loss of balance. Patient denies any skin changes, loss of hair or  loss of weight. Patient denies any excessive worrying or anxiety or significant depression. Patient denies any problems with insomnia. Patient denies excessive thirst, polyuria, polydipsia. Patient denies any swollen glands, patient denies easy bruising or easy bleeding. Patient denies any recent infections, allergies or URI. Patient "s visual fields have not changed significantly in recent time.    PHYSICAL EXAM: BP 130/77 mmHg  Pulse 76  Temp(Src) 96.9 F (36.1 C)  Resp 20  Wt 166 lb 12.5 oz (75.65 kg) On rectal exam rectal sphincter tone is good no evidence of mass or nodularity in the anal region are anal canal is noted. Otherwise rectal exam is unremarkable. Well-developed well-nourished patient in NAD. HEENT reveals PERLA, EOMI, discs not visualized.  Oral cavity is clear. No oral mucosal lesions are identified. Neck is clear without evidence of cervical or supraclavicular adenopathy. Lungs are clear to A&P. Cardiac examination is essentially unremarkable with regular rate and rhythm without murmur rub or thrill. Abdomen is benign with no organomegaly or masses noted. Motor sensory and DTR levels are equal and symmetric in the upper and lower extremities. Cranial nerves II through XII are grossly intact. Proprioception is  intact. No peripheral adenopathy or edema is identified. No motor or sensory levels are noted. Crude visual fields are within normal range.  LABORATORY DATA: Pathology reports reviewed    RADIOLOGY RESULTS: PET CT scan reviewed   IMPRESSION: Stage I anal cancer with residual dysplasia on repeated re-excisions of anal tumor in 74 year old female  PLAN: At this time based on factors no residual invasive anal cancer remaining would elect to observe the patient this time. Other topical treatments such as 5-FU or other anal ablative treatments may be indicated at some point although I believe at this time continued observation will be the best avenue. I've asked to see her  back in 6 months for follow-up and we'll reexamine her at that time. She continues close follow-up care with her surgeon. We can always salvage this area with chemoradiation should she develop recurrence of her invasive squamous cell component. Patient and daughter both comprehend my treatment plan well.  I would like to take this opportunity for allowing me to participate in the care of your patient.Armstead Peaks., MD

## 2015-10-09 ENCOUNTER — Inpatient Hospital Stay (HOSPITAL_BASED_OUTPATIENT_CLINIC_OR_DEPARTMENT_OTHER): Payer: Medicare Other | Admitting: Internal Medicine

## 2015-10-09 VITALS — BP 158/83 | HR 73 | Temp 96.3°F | Resp 16 | Wt 166.4 lb

## 2015-10-09 DIAGNOSIS — C210 Malignant neoplasm of anus, unspecified: Secondary | ICD-10-CM

## 2015-10-09 DIAGNOSIS — C44500 Unspecified malignant neoplasm of anal skin: Secondary | ICD-10-CM

## 2015-10-09 NOTE — Progress Notes (Signed)
.[facility] OFFICE PROGRESS NOTE  Patient Care Team: Chrisandra Carota, MD as PCP - General (Family Medicine) Hayden Pedro, MD (Ophthalmology)   SUMMARY OF ONCOLOGIC HISTORY:  # SEP 2016- SCC of anal margin- STAGE I [s/p "external hemorroidectomy"; Dr.Smith] 1.6CM arising in high grade AIN-3; Positive margins; OCT 24th- Re-Excision- 1-3' O clock- POSITIVE for AIN-G-3;NOV 28th-RE-Excision- Margins POS DYSPLASIA. PET 2016-rectal/anal uptake; No distant metastatic disease/lymphadenopathy. PLAN Surveillance  # History of sarcoidosis- PET scan-calcified granulomas/mild hilar/mediastinal adenopathy; # [place] [Nov 2016; Dr.Oh]-negative for any lesions.   INTERVAL HISTORY:  A pleasant 74 year old female patient with above history of squamous cell carcinoma of the anal margin status post external hemorrhoidectomy with positive margins has underwent reexcision on Nov 28th is here for follow-up.    Patient denies any pain in the anal region. No blood in stools.  REVIEW OF SYSTEMS:    no unusual shortness of breath or cough.   PAST MEDICAL HISTORY :  Past Medical History  Diagnosis Date  . Sarcoidosis ([place])   . Asthma   . Irregular heartbeat   . TIA (transient ischemic attack)   . Osteopenia   . Macular degeneration   . Mitral valve prolapse   . Heart murmur   . Shortness of breath dyspnea     with exertion  . Arthritis   . Glaucoma (increased eye pressure)   . Headache   . Hyperlipidemia   . Colon polyp 05/22/2013  . Cancer ([facility])     anal  . Neck pain   . Sarcoidosis ([place])   . Localized primary osteoarthritis of lower leg   . Hypercholesterolemia   . Carotid stenosis   . Foot mass     PAST SURGICAL HISTORY :   Past Surgical History  Procedure Laterality Date  . Hysterectomy with ovaries intact    . Cataract surgery  08/18/09    left eye  . Abdominal hysterectomy    . Eye surgery Bilateral     cataract extraction  . Tonsillectomy    . Dilation and  curettage of uterus    . Hemorrhoid surgery N/A 07/24/2015    Procedure: HEMORRHOIDECTOMY/INTERNAL AND EXTERNAL;  Surgeon: Leonie Green, MD;  Location: [facility] ORS;  Service: General;  Laterality: N/A;  . Colonoscopy  05/22/2013  . Mass excision N/A 08/21/2015    Procedure: EXCISION MASS/ ANAL MASS;  Surgeon: Leonie Green, MD;  Location: [facility] ORS;  Service: General;  Laterality: N/A;  . Colonoscopy with propofol N/A 09/15/2015    Procedure: COLONOSCOPY WITH PROPOFOL;  Surgeon: Hulen Luster, MD;  Location: [facility] ENDOSCOPY;  Service: Gastroenterology;  Laterality: N/A;  . Tumor excision N/A 09/16/2015    Procedure: TUMOR EXCISION RECTAL;  Surgeon: Leonie Green, MD;  Location: [facility] ORS;  Service: General;  Laterality: N/A;    FAMILY HISTORY :  No family history on file.  SOCIAL HISTORY:   Social History  Substance Use Topics  . Smoking status: Never Smoker   . Smokeless tobacco: Never Used  . Alcohol Use: No    ALLERGIES:  is allergic to aspirin; dairy aid; diazepam; and shellfish-derived products.  MEDICATIONS:  Current Outpatient Prescriptions  Medication Sig Dispense Refill  . ALPHAGAN P 0.15 % ophthalmic solution Place 2 drops into the left eye 2 (two) times daily.     Marland Kitchen aspirin EC 81 MG tablet Take 81 mg by mouth daily.    . beclomethasone (QVAR) 80 MCG/ACT inhaler Inhale 2 puffs into the  lungs 2 (two) times daily. 3 Inhaler 3  . cholecalciferol (VITAMIN D) 1000 UNITS tablet Take 1,000 Units by mouth daily.     Marland Kitchen lidocaine (LIDODERM) 5 % Place 1 patch onto the skin daily as needed. Remove & Discard patch within 12 hours or as directed by MD    . loperamide (IMODIUM) 2 MG capsule Take by mouth as needed for diarrhea or loose stools.    . Magnesium 400 MG TABS Take 1 tablet by mouth every morning.     Marland Kitchen MAXAIR AUTOHALER 200 MCG/INH inhaler Inhale 2 puffs into the lungs as needed.    . Multiple Vitamin (MULTIVITAMIN WITH MINERALS) TABS Take 1 tablet by mouth daily.  Centrum Silver    . Multiple Vitamins-Minerals (PRESERVISION AREDS) CAPS Take 1 capsule by mouth daily.    Marland Kitchen omeprazole (PRILOSEC) 20 MG capsule Take 1 capsule by mouth 2 (two) times daily.  11  . potassium chloride (K-DUR) 10 MEQ tablet Take 20 mEq by mouth 2 (two) times daily.    . rosuvastatin (CRESTOR) 5 MG tablet Take 5 mg by mouth every morning.     . sucralfate (CARAFATE) 1 GM/10ML suspension Take 1 g by mouth 4 (four) times daily -  with meals and at bedtime.    Marland Kitchen zolpidem (AMBIEN) 5 MG tablet Take 5 mg by mouth at bedtime as needed for sleep.     No current facility-administered medications for this visit.    PHYSICAL EXAMINATION: ECOG PERFORMANCE STATUS: 0 - Asymptomatic  BP 158/83 mmHg  Pulse 73  Temp(Src) 96.3 F (35.7 C) (Tympanic)  Resp 16  Wt 166 lb 7.2 oz (75.5 kg)  Filed Weights   10/09/15 0925  Weight: 166 lb 7.2 oz (75.5 kg)    GENERAL: Well-nourished well-developed; Alert, no distress and comfortable.   Accompanied by her daughter.    LABORATORY DATA:  I have reviewed the data as listed    Component Value Date/Time   NA 141 07/15/2015 1407   K 3.9 07/15/2015 1407   CL 101 07/15/2015 1407   CO2 31 07/15/2015 1407   GLUCOSE 92 07/15/2015 1407   BUN 15 07/15/2015 1407   CREATININE 0.63 07/15/2015 1407   CALCIUM 10.1 07/15/2015 1407   PROT 7.4 07/15/2015 1407   ALBUMIN 4.0 07/15/2015 1407   AST 18 07/15/2015 1407   ALT 17 07/15/2015 1407   ALKPHOS 69 07/15/2015 1407   BILITOT 0.5 07/15/2015 1407   GFRNONAA >60 07/15/2015 1407   GFRAA >60 07/15/2015 1407    No results found for: SPEP, UPEP  Lab Results  Component Value Date   WBC 6.6 07/15/2015   NEUTROABS 4.9 07/15/2015   HGB 13.3 07/15/2015   HCT 40.3 07/15/2015   MCV 96.2 07/15/2015   PLT 306 07/15/2015      Chemistry      Component Value Date/Time   NA 141 07/15/2015 1407   K 3.9 07/15/2015 1407   CL 101 07/15/2015 1407   CO2 31 07/15/2015 1407   BUN 15 07/15/2015 1407    CREATININE 0.63 07/15/2015 1407      Component Value Date/Time   CALCIUM 10.1 07/15/2015 1407   ALKPHOS 69 07/15/2015 1407   AST 18 07/15/2015 1407   ALT 17 07/15/2015 1407   BILITOT 0.5 07/15/2015 1407       RADIOGRAPHIC STUDIES: I have personally reviewed the radiological images as listed and agreed with the findings in the report. No results found.   ASSESSMENT & PLAN:   #  STAGE I- Squamous cell carcinoma of the anal margin status post external hemorrhoidectomy;RE-Excision x2 [most recent Nov 28th 2016] margins POSITIVE FOR DYSPLASIA. Patient's case was discussed at the tumor conference on December 5th- reexcision if possible; otherwise no recommendations for any radiation therapy or any chemotherapy. Patient has also met with Dr. Donella Stade radiation oncologist- no plans for any radiation.  # I spoke to Dr. Tamala Julian- who feels reexcision is difficult; surveillance might be reasonable.  All questions were answered. The patient knows to call the clinic with any problems, questions or concerns. No barriers to learning was detected.  # Patient will follow-up with me in 6 months.  # 15 minutes face-to-face with the patient discussing the above plan of care; more than 50% of time spent on prognosis/ natural history; counseling and coordination.      Cammie Sickle, MD 10/09/2015 9:32 AM

## 2015-10-10 LAB — SURGICAL PATHOLOGY

## 2015-11-07 ENCOUNTER — Other Ambulatory Visit: Payer: Self-pay | Admitting: Family Medicine

## 2015-11-07 DIAGNOSIS — R221 Localized swelling, mass and lump, neck: Secondary | ICD-10-CM

## 2015-11-13 ENCOUNTER — Ambulatory Visit
Admission: RE | Admit: 2015-11-13 | Discharge: 2015-11-13 | Disposition: A | Discharge status: Home / Self Care | Payer: Medicare Other | Source: Ambulatory Visit | Attending: Family Medicine | Admitting: Family Medicine

## 2015-11-13 DIAGNOSIS — R221 Localized swelling, mass and lump, neck: Secondary | ICD-10-CM | POA: Diagnosis not present

## 2015-11-13 LAB — POCT I-STAT CREATININE: Creatinine, Ser: 0.7 mg/dL (ref 0.44–1.00)

## 2015-11-13 MED ORDER — IOHEXOL 300 MG/ML  SOLN
75.0000 mL | Freq: Once | INTRAMUSCULAR | Status: AC | PRN
  Administered 2015-11-13: 75 mL via INTRAVENOUS

## 2015-11-17 ENCOUNTER — Emergency Department
Admission: EM | Admit: 2015-11-17 | Discharge: 2015-11-17 | Disposition: A | Discharge status: Home / Self Care | Payer: Medicare Other | Attending: Emergency Medicine | Admitting: Emergency Medicine

## 2015-11-17 ENCOUNTER — Encounter: Payer: Self-pay | Admitting: Emergency Medicine

## 2015-11-17 DIAGNOSIS — Z79899 Other long term (current) drug therapy: Secondary | ICD-10-CM | POA: Insufficient documentation

## 2015-11-17 DIAGNOSIS — R21 Rash and other nonspecific skin eruption: Secondary | ICD-10-CM | POA: Diagnosis present

## 2015-11-17 DIAGNOSIS — Z7951 Long term (current) use of inhaled steroids: Secondary | ICD-10-CM | POA: Diagnosis not present

## 2015-11-17 DIAGNOSIS — B029 Zoster without complications: Secondary | ICD-10-CM | POA: Insufficient documentation

## 2015-11-17 DIAGNOSIS — Z7982 Long term (current) use of aspirin: Secondary | ICD-10-CM | POA: Insufficient documentation

## 2015-11-17 MED ORDER — ACYCLOVIR 800 MG PO TABS: 800.0000 mg | ORAL_TABLET | Freq: Every day | ORAL | Status: DC

## 2015-11-17 MED ORDER — TRIAMCINOLONE ACETONIDE 0.1 % EX OINT
1.0000 | TOPICAL_OINTMENT | Freq: Two times a day (BID) | CUTANEOUS | Status: DC
Start: 2015-11-17 — End: 2016-01-07

## 2015-11-17 NOTE — Discharge Instructions (Signed)

## 2015-11-17 NOTE — ED Notes (Addendum)
Pt reports CT of neck and head last week, reports burning sensation in neck and red blotches visible in multiple areas on neck.

## 2015-11-17 NOTE — ED Notes (Signed)
Pt states had CT scan last week on throat. Pt states burning sensation and rash to neck over the course of the past two days. Pt states lesions have grown in size. Denies itching.

## 2015-11-19 NOTE — ED Provider Notes (Signed)
[facility] Emergency Department Provider Note ____________________________________________  Time seen: 1720  I have reviewed the triage vital signs and the nursing notes.  HISTORY  Chief Complaint  Allergic Reaction  HPI Lisa [name] is a 75 y.o. female resents to the ED for evaluation of a painful rash is developed to her neck over the last 5-7 days. She initially is under the impression that she has had an allergic reaction to the contrast dye eating for a neck CT she had last week. She reports previous contrasted studies, without allergic reaction. She initially noted the onset of some burning sensation to the neck and with that in the last 2-3 days, has developed multiple red blotches to multiple areas of the neck, on both sides. She describes the rash as burning, and painful in nature. She also notes some irritation into the nape of the neck and the hairline. She denies any interim fevers, chills, sweats. She does report receiving the shingles vaccine a few years prior. She denies any other rash, outbreak, exposure. She denies any indication of the rash beyond her collarbones at this time.  Past Medical History  Diagnosis Date  . Sarcoidosis ([place])   . Asthma   . Irregular heartbeat   . TIA (transient ischemic attack)   . Osteopenia   . Macular degeneration   . Mitral valve prolapse   . Heart murmur   . Shortness of breath dyspnea     with exertion  . Arthritis   . Glaucoma (increased eye pressure)   . Headache   . Hyperlipidemia   . Colon polyp 05/22/2013  . Neck pain   . Sarcoidosis ([place])   . Localized primary osteoarthritis of lower leg   . Hypercholesterolemia   . Carotid stenosis   . Foot mass   . Cancer [facility])     anal    Patient Active Problem List   Diagnosis Date Noted  . Asthma without status asthmaticus 09/25/2015  . Cancer of anal margin 08/20/2015  . Chest pain 07/23/2015  . Chronic rhinitis 06/04/2015  . Near syncope 09/12/2014   . Primary osteoarthritis of both knees 08/16/2014  . Muscle strain 05/17/2014  . Cephalalgia 04/10/2014  . LBP (low back pain) 04/10/2014  . Cervical pain 04/10/2014  . Decreased potassium in the blood 03/25/2014  . Arthritis of knee, degenerative 01/25/2014  . 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 Foot mass 06/05/2013  . Arthritis of foot, degenerative 06/05/2013  . Carotid artery narrowing 03/30/2013  . Cardiac murmur 03/07/2013  . Persons encountering health services in other specified circumstances 10/26/2012  . Airway hyperreactivity 08/25/2012  . Hypercholesterolemia 08/25/2012  . Degeneration macular 08/25/2012  . Carotid stenosis, bilateral 07/21/2012  . Idiopathic localized osteoarthropathy 02/17/2012  . Asthma, mild persistent 02/14/2012  . URI (upper respiratory infection) 11/15/2011  . ALLERGY, FOOD 05/20/2010  . PULMONARY NODULE, LEFT UPPER LOBE 08/06/2008  . Disease of lung 08/06/2008  . Respiratory insufficiency 08/06/2008  . Sarcoidosis ([place]) 08/22/2007    Past Surgical History  Procedure Laterality Date  . Hysterectomy with ovaries intact    . Cataract surgery  08/18/09    left eye  . Abdominal hysterectomy    . Eye surgery Bilateral     cataract extraction  . Tonsillectomy    . Dilation and curettage of uterus    . Hemorrhoid surgery N/A 07/24/2015    Procedure: HEMORRHOIDECTOMY/INTERNAL AND EXTERNAL;  Surgeon: Leonie Green, MD;  Location: [facility] ORS;  Service: General;  Laterality: N/A;  . Colonoscopy  05/22/2013  . Mass excision N/A 08/21/2015    Procedure: EXCISION MASS/ ANAL MASS;  Surgeon: Leonie Green, MD;  Location: [facility] ORS;  Service: General;  Laterality: N/A;  . Colonoscopy with propofol N/A 09/15/2015    Procedure: COLONOSCOPY WITH PROPOFOL;  Surgeon: Hulen Luster, MD;  Location: [facility] ENDOSCOPY;  Service: Gastroenterology;  Laterality: N/A;  . Tumor excision N/A 09/16/2015    Procedure: TUMOR EXCISION  RECTAL;  Surgeon: Leonie Green, MD;  Location: [facility] ORS;  Service: General;  Laterality: N/A;    Current Outpatient Rx  Name  Route  Sig  Dispense  Refill  . acyclovir (ZOVIRAX) 800 MG tablet   Oral   Take 1 tablet (800 mg total) by mouth 5 (five) times daily.   35 tablet   0   . ALPHAGAN P 0.15 % ophthalmic solution   Left Eye   Place 2 drops into the left eye 2 (two) times daily.          Marland Kitchen aspirin EC 81 MG tablet   Oral   Take 81 mg by mouth daily.         . beclomethasone (QVAR) 80 MCG/ACT inhaler   Inhalation   Inhale 2 puffs into the lungs 2 (two) times daily.   3 Inhaler   3   . cholecalciferol (VITAMIN D) 1000 UNITS tablet   Oral   Take 1,000 Units by mouth daily.          Marland Kitchen lidocaine (LIDODERM) 5 %   Transdermal   Place 1 patch onto the skin daily as needed. Remove & Discard patch within 12 hours or as directed by MD         . loperamide (IMODIUM) 2 MG capsule   Oral   Take by mouth as needed for diarrhea or loose stools.         . Magnesium 400 MG TABS   Oral   Take 1 tablet by mouth every morning.          Marland Kitchen MAXAIR AUTOHALER 200 MCG/INH inhaler   Inhalation   Inhale 2 puffs into the lungs as needed.         . Multiple Vitamin (MULTIVITAMIN WITH MINERALS) TABS   Oral   Take 1 tablet by mouth daily. Centrum Silver         . Multiple Vitamins-Minerals (PRESERVISION AREDS) CAPS   Oral   Take 1 capsule by mouth daily.         Marland Kitchen omeprazole (PRILOSEC) 20 MG capsule   Oral   Take 1 capsule by mouth 2 (two) times daily.      11   . potassium chloride (K-DUR) 10 MEQ tablet   Oral   Take 20 mEq by mouth 2 (two) times daily.         . rosuvastatin (CRESTOR) 5 MG tablet   Oral   Take 5 mg by mouth every morning.          . sucralfate (CARAFATE) 1 GM/10ML suspension   Oral   Take 1 g by mouth 4 (four) times daily -  with meals and at bedtime.         . triamcinolone ointment (KENALOG) 0.1 %   Topical   Apply 1  application topically 2 (two) times daily.   30 g   1   . zolpidem (AMBIEN) 5 MG tablet   Oral   Take 5 mg  by mouth at bedtime as needed for sleep.          Allergies Aspirin; Dairy aid; Diazepam; and Shellfish-derived products  No family history on file.  Social History Social History  Substance Use Topics  . Smoking status: Never Smoker   . Smokeless tobacco: Never Used  . Alcohol Use: No   Review of Systems  Constitutional: Negative for fever. Eyes: Negative for visual changes. ENT: Negative for sore throat. Cardiovascular: Negative for chest pain. Respiratory: Negative for shortness of breath. Gastrointestinal: Negative for abdominal pain, vomiting and diarrhea. Genitourinary: Negative for dysuria. Musculoskeletal: Negative for back pain. Skin: Positive for painful rash as above Neurological: Negative for headaches, focal weakness or numbness. ____________________________________________  PHYSICAL EXAM:  VITAL SIGNS: ED Triage Vitals  Enc Vitals Group     BP 11/17/15 1549 180/89 mmHg     Pulse Rate 11/17/15 1549 71     Resp 11/17/15 1549 16     Temp 11/17/15 1549 98 F (36.7 C)     Temp Source 11/17/15 1549 Oral     SpO2 11/17/15 1549 96 %     Weight 11/17/15 1549 168 lb (76.204 kg)     Height 11/17/15 1549 5\' 7"  (1.702 m)     Head Cir --      Peak Flow --      Pain Score 11/17/15 1800 6     Pain Loc --      Pain Edu? --      Excl. in [place]? --    Constitutional: Alert and oriented. Well appearing and in no distress. Head: Normocephalic and atraumatic.      Eyes: Conjunctivae are normal. PERRL. Normal extraocular movements      Ears: Canals clear. TMs intact bilaterally.   Nose: No congestion/rhinorrhea.   Mouth/Throat: Mucous membranes are moist.   Neck: Supple. No thyromegaly. Hematological/Lymphatic/Immunological: No cervical lymphadenopathy. Cardiovascular: Normal rate, regular rhythm.  Respiratory: Normal respiratory effort. No  wheezes/rales/rhonchi. Gastrointestinal: Soft and nontender. No distention. Musculoskeletal: Nontender with normal range of motion in all extremities.  Neurologic:  Normal gait without ataxia. Normal speech and language. No gross focal neurologic deficits are appreciated. Skin:  Skin is warm, dry and intact. Patient started to have multiple papular, raised lesions to her neck bilaterally from the nape across the upper trapezius region. The distribution appears to follow the C4 dermatomal distribution, bilaterally. No indication of cellulitis, or secondary infection. Psychiatric: Mood and affect are normal. Patient exhibits appropriate insight and judgment. ____________________________________________  INITIAL IMPRESSION / ASSESSMENT AND PLAN / ED COURSE  Patient with a painful rash with a prodrome, that appears to be consistent with a primary shingles outbreak. Despite the bilateral distribution, the patient clinically appears to have a shingles rash. The presentation may be somewhat complicated by her having received a shingles vaccine. Patient is discharged with a prescription for acyclovir and triamcinolone ointment to dose as directed. She'll follow with the primary care provider for reevaluation in 1 week or less. ____________________________________________  FINAL CLINICAL IMPRESSION(S) / ED DIAGNOSES  Final diagnoses:  Shingles      Melvenia Needles, PA-C 11/19/15 0102  Harvest Dark, MD 11/21/15 [phone]

## 2016-01-07 ENCOUNTER — Encounter: Payer: Self-pay | Admitting: Emergency Medicine

## 2016-01-07 ENCOUNTER — Emergency Department
Admission: EM | Admit: 2016-01-07 | Discharge: 2016-01-07 | Disposition: A | Discharge status: Home / Self Care | Payer: Medicare Other | Attending: Emergency Medicine | Admitting: Emergency Medicine

## 2016-01-07 DIAGNOSIS — Z85048 Personal history of other malignant neoplasm of rectum, rectosigmoid junction, and anus: Secondary | ICD-10-CM | POA: Insufficient documentation

## 2016-01-07 DIAGNOSIS — Z888 Allergy status to other drugs, medicaments and biological substances status: Secondary | ICD-10-CM | POA: Diagnosis not present

## 2016-01-07 DIAGNOSIS — Z8673 Personal history of transient ischemic attack (TIA), and cerebral infarction without residual deficits: Secondary | ICD-10-CM | POA: Diagnosis not present

## 2016-01-07 DIAGNOSIS — Z91011 Allergy to milk products: Secondary | ICD-10-CM | POA: Diagnosis not present

## 2016-01-07 DIAGNOSIS — B029 Zoster without complications: Secondary | ICD-10-CM | POA: Diagnosis not present

## 2016-01-07 DIAGNOSIS — E785 Hyperlipidemia, unspecified: Secondary | ICD-10-CM | POA: Insufficient documentation

## 2016-01-07 DIAGNOSIS — E7800 Pure hypercholesterolemia, unspecified: Secondary | ICD-10-CM | POA: Insufficient documentation

## 2016-01-07 DIAGNOSIS — Z79899 Other long term (current) drug therapy: Secondary | ICD-10-CM | POA: Insufficient documentation

## 2016-01-07 DIAGNOSIS — Z90710 Acquired absence of both cervix and uterus: Secondary | ICD-10-CM | POA: Insufficient documentation

## 2016-01-07 DIAGNOSIS — J45909 Unspecified asthma, uncomplicated: Secondary | ICD-10-CM | POA: Diagnosis not present

## 2016-01-07 MED ORDER — LIDOCAINE 0.5 % EX GEL: 1.0000 "application " | Freq: Three times a day (TID) | CUTANEOUS | Status: DC | PRN

## 2016-01-07 MED ORDER — LIDOCAINE 5 % EX OINT
TOPICAL_OINTMENT | Freq: Every day | CUTANEOUS | Status: DC | PRN
  Administered 2016-01-07: 22:00:00 via TOPICAL
  Filled 2016-01-07: qty 35.44

## 2016-01-07 MED ORDER — TRAMADOL HCL 50 MG PO TABS: 50.0000 mg | ORAL_TABLET | Freq: Three times a day (TID) | ORAL | Status: DC | PRN

## 2016-01-07 MED ORDER — TRIAMCINOLONE ACETONIDE 0.5 % EX OINT: 1.0000 "application " | TOPICAL_OINTMENT | Freq: Two times a day (BID) | CUTANEOUS | Status: DC

## 2016-01-07 MED ORDER — VALACYCLOVIR HCL 1 G PO TABS: 1000.0000 mg | ORAL_TABLET | Freq: Three times a day (TID) | ORAL | Status: AC

## 2016-01-07 NOTE — ED Provider Notes (Signed)
[facility] Emergency Department Provider Note ____________________________________________  Time seen: 2116  I have reviewed the triage vital signs and the nursing notes.  HISTORY  Chief Complaint  Herpes Zoster  HPI Lisa [name] is a 75 y.o. female is to the ED for evaluation of what she is supposed to be a recurrence of a shingles outbreak to her neck. She describes being seen here and treated about one month prior for shingles outbreak to her neck. She reports that her symptoms resolved with the use of the pills and topical steroid ointment. Since that time she has been symptom-free with the exception of what she now recalls as an intermittent outbreak about a week and a half prior. She describes again the onset of a painful rash to the neck, that she began to apply the steroid cream to.She describes some improvement, but describes today again the sudden onset of painful red rash to the neck. She describes a red rash as being bilateral and reports that it is aggravating to have her shirt collar touch her neck. She's been applying triamcinolone cream without benefit at this time. She also endorses that she was evaluated by her primary care provider the day after being diagnosed here, and her PCP concurred with the diagnosis and treatment. She reports the discomfort at this time at a 7-8 out of 10 in triage.  Past Medical History  Diagnosis Date  . Sarcoidosis ([place])   . Asthma   . Irregular heartbeat   . TIA (transient ischemic attack)   . Osteopenia   . Macular degeneration   . Mitral valve prolapse   . Heart murmur   . Shortness of breath dyspnea     with exertion  . Arthritis   . Glaucoma (increased eye pressure)   . Headache   . Hyperlipidemia   . Colon polyp 05/22/2013  . Neck pain   . Sarcoidosis ([place])   . Localized primary osteoarthritis of lower leg   . Hypercholesterolemia   . Carotid stenosis   . Foot mass   . Cancer [facility])     anal     Patient Active Problem List   Diagnosis Date Noted  . Asthma without status asthmaticus 09/25/2015  . Cancer of anal margin 08/20/2015  . Chest pain 07/23/2015  . Chronic rhinitis 06/04/2015  . Near syncope 09/12/2014  . Primary osteoarthritis of both knees 08/16/2014  . Muscle strain 05/17/2014  . Cephalalgia 04/10/2014  . LBP (low back pain) 04/10/2014  . Cervical pain 04/10/2014  . Decreased potassium in the blood 03/25/2014  . Arthritis of knee, degenerative 01/25/2014  . 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 Foot mass 06/05/2013  . Arthritis of foot, degenerative 06/05/2013  . Carotid artery narrowing 03/30/2013  . Cardiac murmur 03/07/2013  . Persons encountering health services in other specified circumstances 10/26/2012  . Airway hyperreactivity 08/25/2012  . Hypercholesterolemia 08/25/2012  . Degeneration macular 08/25/2012  . Carotid stenosis, bilateral 07/21/2012  . Idiopathic localized osteoarthropathy 02/17/2012  . Asthma, mild persistent 02/14/2012  . URI (upper respiratory infection) 11/15/2011  . ALLERGY, FOOD 05/20/2010  . PULMONARY NODULE, LEFT UPPER LOBE 08/06/2008  . Disease of lung 08/06/2008  . Respiratory insufficiency 08/06/2008  . Sarcoidosis ([place]) 08/22/2007    Past Surgical History  Procedure Laterality Date  . Hysterectomy with ovaries intact    . Cataract surgery  08/18/09    left eye  . Abdominal hysterectomy    . Eye surgery Bilateral  cataract extraction  . Tonsillectomy    . Dilation and curettage of uterus    . Hemorrhoid surgery N/A 07/24/2015    Procedure: HEMORRHOIDECTOMY/INTERNAL AND EXTERNAL;  Surgeon: Leonie Green, MD;  Location: [facility] ORS;  Service: General;  Laterality: N/A;  . Colonoscopy  05/22/2013  . Mass excision N/A 08/21/2015    Procedure: EXCISION MASS/ ANAL MASS;  Surgeon: Leonie Green, MD;  Location: [facility] ORS;  Service: General;  Laterality: N/A;  . Colonoscopy  with propofol N/A 09/15/2015    Procedure: COLONOSCOPY WITH PROPOFOL;  Surgeon: Hulen Luster, MD;  Location: [facility] ENDOSCOPY;  Service: Gastroenterology;  Laterality: N/A;  . Tumor excision N/A 09/16/2015    Procedure: TUMOR EXCISION RECTAL;  Surgeon: Leonie Green, MD;  Location: [facility] ORS;  Service: General;  Laterality: N/A;    Current Outpatient Rx  Name  Route  Sig  Dispense  Refill  . acyclovir (ZOVIRAX) 800 MG tablet   Oral   Take 1 tablet (800 mg total) by mouth 5 (five) times daily.   35 tablet   0   . ALPHAGAN P 0.15 % ophthalmic solution   Left Eye   Place 2 drops into the left eye 2 (two) times daily.          Marland Kitchen aspirin EC 81 MG tablet   Oral   Take 81 mg by mouth daily.         . beclomethasone (QVAR) 80 MCG/ACT inhaler   Inhalation   Inhale 2 puffs into the lungs 2 (two) times daily.   3 Inhaler   3   . cholecalciferol (VITAMIN D) 1000 UNITS tablet   Oral   Take 1,000 Units by mouth daily.          Marland Kitchen lidocaine (LIDODERM) 5 %   Transdermal   Place 1 patch onto the skin daily as needed. Remove & Discard patch within 12 hours or as directed by MD         . Lidocaine 0.5 % GEL   Apply externally   Apply 1 application topically 3 (three) times daily as needed.   35.44 g   0   . loperamide (IMODIUM) 2 MG capsule   Oral   Take by mouth as needed for diarrhea or loose stools.         . Magnesium 400 MG TABS   Oral   Take 1 tablet by mouth every morning.          Marland Kitchen MAXAIR AUTOHALER 200 MCG/INH inhaler   Inhalation   Inhale 2 puffs into the lungs as needed.         . Multiple Vitamin (MULTIVITAMIN WITH MINERALS) TABS   Oral   Take 1 tablet by mouth daily. Centrum Silver         . Multiple Vitamins-Minerals (PRESERVISION AREDS) CAPS   Oral   Take 1 capsule by mouth daily.         Marland Kitchen omeprazole (PRILOSEC) 20 MG capsule   Oral   Take 1 capsule by mouth 2 (two) times daily.      11   . potassium chloride (K-DUR) 10 MEQ tablet    Oral   Take 20 mEq by mouth 2 (two) times daily.         . rosuvastatin (CRESTOR) 5 MG tablet   Oral   Take 5 mg by mouth every morning.          . sucralfate (CARAFATE) 1 GM/10ML suspension  Oral   Take 1 g by mouth 4 (four) times daily -  with meals and at bedtime.         . traMADol (ULTRAM) 50 MG tablet   Oral   Take 1 tablet (50 mg total) by mouth 3 (three) times daily as needed.   15 tablet   0   . triamcinolone ointment (KENALOG) 0.5 %   Topical   Apply 1 application topically 2 (two) times daily.   30 g   0   . valACYclovir (VALTREX) 1000 MG tablet   Oral   Take 1 tablet (1,000 mg total) by mouth 3 (three) times daily. Herpes labialis   21 tablet   0   . zolpidem (AMBIEN) 5 MG tablet   Oral   Take 5 mg by mouth at bedtime as needed for sleep.          Allergies Aspirin; Dairy aid; Diazepam; and Shellfish-derived products  History reviewed. No pertinent family history.  Social History Social History  Substance Use Topics  . Smoking status: Never Smoker   . Smokeless tobacco: Never Used  . Alcohol Use: No   Review of Systems  Constitutional: Negative for fever. Eyes: Negative for visual changes. ENT: Negative for sore throat. Cardiovascular: Negative for chest pain. Respiratory: Negative for shortness of breath. Skin: Positive for painful rash. Neurological: Negative for headaches, focal weakness or numbness. ____________________________________________  PHYSICAL EXAM:  VITAL SIGNS: ED Triage Vitals  Enc Vitals Group     BP 01/07/16 2058 136/79 mmHg     Pulse Rate 01/07/16 2058 78     Resp 01/07/16 2058 16     Temp 01/07/16 2058 98.1 F (36.7 C)     Temp Source 01/07/16 2058 Oral     SpO2 01/07/16 2058 97 %     Weight --      Height --      Head Cir --      Peak Flow --      Pain Score 01/07/16 2054 8     Pain Loc --      Pain Edu? --      Excl. in [place]? --    Constitutional: Alert and oriented. Well appearing and in no  distress. Head: Normocephalic and atraumatic.      Eyes: Conjunctivae are normal. PERRL. Normal extraocular movements      Ears: Canals clear. TMs intact bilaterally.   Nose: No congestion/rhinorrhea.   Mouth/Throat: Mucous membranes are moist.   Neck: Supple. No thyromegaly. Hematological/Lymphatic/Immunological: No cervical lymphadenopathy. Cardiovascular: Normal rate, regular rhythm.  Respiratory: Normal respiratory effort. No wheezes/rales/rhonchi. Musculoskeletal: Nontender with normal range of motion in all extremities.  Neurologic:  Normal gait without ataxia. Normal speech and language. No gross focal neurologic deficits are appreciated. Skin:  Skin is warm, dry and intact. Patient with erythematous macules scattered around the neck and a single, bilateral, dermatomal pattern. No appreciable blister formation at this time. Psychiatric: Mood and affect are normal. Patient exhibits appropriate insight and judgment. ____________________________________________  PROCEDURES  Lidocaine 5% ointment topically ____________________________________________  INITIAL IMPRESSION / ASSESSMENT AND PLAN / ED COURSE  Patient with what appears to be a recurrence of her shingles rash to the C3 dermatome, with bilateral distribution on this presentation. Her unusual rash distribution and recurrence may be subject to her immunocompromised state, given her cancer history. She will be treated with valacyclovir, tramadol, triamcinolone ointment, and lidocaine ointment. She will follow-up with her provider for further evaluation and management.  ____________________________________________  FINAL CLINICAL IMPRESSION(S) / ED DIAGNOSES  Final diagnoses:  Shingles outbreak      Melvenia Needles, PA-C 01/07/16 [id number]  Ponciano Ort, MD 01/08/16 [phone]

## 2016-01-07 NOTE — Discharge Instructions (Signed)

## 2016-01-07 NOTE — ED Notes (Signed)
Pt presents ED for shingles evaluation. Pt states she was diagnosed here with shingles about a month ago and was prescribed meds to take. Symptoms resolved then and reapear today. Red spots around the neck noted.

## 2016-01-07 NOTE — ED Notes (Signed)
Waiting for lido from pharm before discharge given

## 2016-01-12 ENCOUNTER — Other Ambulatory Visit: Payer: Self-pay | Admitting: Internal Medicine

## 2016-04-05 ENCOUNTER — Ambulatory Visit
Admission: RE | Admit: 2016-04-05 | Discharge: 2016-04-05 | Disposition: A | Discharge status: Home / Self Care | Payer: Medicare Other | Source: Ambulatory Visit | Attending: Radiation Oncology | Admitting: Radiation Oncology

## 2016-04-05 ENCOUNTER — Encounter: Payer: Self-pay | Admitting: Radiation Oncology

## 2016-04-05 VITALS — BP 142/78 | HR 78 | Temp 96.8°F | Wt 165.1 lb

## 2016-04-05 DIAGNOSIS — Z85048 Personal history of other malignant neoplasm of rectum, rectosigmoid junction, and anus: Secondary | ICD-10-CM | POA: Insufficient documentation

## 2016-04-05 DIAGNOSIS — C210 Malignant neoplasm of anus, unspecified: Secondary | ICD-10-CM

## 2016-04-05 NOTE — Progress Notes (Signed)
Radiation Oncology Follow up Note  Name: Lisa [name]   Date:   04/05/2016 MRN:  [id number] DOB: January 02, 1941    This 75 y.o. female presents to the clinic today for follow-up for stage I anal cancer not treated with radiation.  REFERRING PROVIDER: Chrisandra Carota, MD  HPI: Patient is a 75 year old female with history dating back to September 2016 which had excision of the hemorrhoid showing focal area invasive squamous cell carcinoma no rising in high-grade squamous intraepithelial lesion with margins involved. She underwent reexcision again showing dysplasia surgical margin. We are following her without performing treatment. She continues to do well. She's having no rectal pain. Bowel movements are good. She is otherwise without complaint.  COMPLICATIONS OF TREATMENT: none  FOLLOW UP COMPLIANCE: keeps appointments   PHYSICAL EXAM:  BP 142/78 mmHg  Pulse 78  Temp(Src) 96.8 F (36 C)  Wt 165 lb 2 oz (74.9 kg) On rectal exam rectal sphincter tone is good no evidence of mass or nodularity is noted in the anal canal or on digital examination. No inguinal adenopathy is identified. Well-developed well-nourished patient in NAD. HEENT reveals PERLA, EOMI, discs not visualized.  Oral cavity is clear. No oral mucosal lesions are identified. Neck is clear without evidence of cervical or supraclavicular adenopathy. Lungs are clear to A&P. Cardiac examination is essentially unremarkable with regular rate and rhythm without murmur rub or thrill. Abdomen is benign with no organomegaly or masses noted. Motor sensory and DTR levels are equal and symmetric in the upper and lower extremities. Cranial nerves II through XII are grossly intact. Proprioception is intact. No peripheral adenopathy or edema is identified. No motor or sensory levels are noted. Crude visual fields are within normal range.  RADIOLOGY RESULTS: No current films for review  PLAN: By clinical examination she continues to do well with no  evidence of recurrent disease. We'll continue to observe her and see her back in 6 months for follow-up. Should her exam be normal at that time will go to once your follow-up visits.  I would like to take this opportunity to thank you for allowing me to participate in the care of your patient.Armstead Peaks., MD

## 2016-04-08 ENCOUNTER — Inpatient Hospital Stay: Payer: Medicare Other | Attending: Internal Medicine | Admitting: Internal Medicine

## 2016-04-08 VITALS — BP 147/80 | HR 59 | Temp 96.4°F | Resp 17 | Ht 67.0 in | Wt 165.8 lb

## 2016-04-08 DIAGNOSIS — C210 Malignant neoplasm of anus, unspecified: Secondary | ICD-10-CM | POA: Diagnosis present

## 2016-04-08 DIAGNOSIS — Z79899 Other long term (current) drug therapy: Secondary | ICD-10-CM | POA: Insufficient documentation

## 2016-04-08 DIAGNOSIS — H409 Unspecified glaucoma: Secondary | ICD-10-CM | POA: Insufficient documentation

## 2016-04-08 DIAGNOSIS — H3530 Unspecified macular degeneration: Secondary | ICD-10-CM | POA: Insufficient documentation

## 2016-04-08 DIAGNOSIS — I341 Nonrheumatic mitral (valve) prolapse: Secondary | ICD-10-CM | POA: Insufficient documentation

## 2016-04-08 DIAGNOSIS — D869 Sarcoidosis, unspecified: Secondary | ICD-10-CM | POA: Insufficient documentation

## 2016-04-08 DIAGNOSIS — J45909 Unspecified asthma, uncomplicated: Secondary | ICD-10-CM | POA: Insufficient documentation

## 2016-04-08 DIAGNOSIS — Z8673 Personal history of transient ischemic attack (TIA), and cerebral infarction without residual deficits: Secondary | ICD-10-CM | POA: Insufficient documentation

## 2016-04-08 DIAGNOSIS — M8580 Other specified disorders of bone density and structure, unspecified site: Secondary | ICD-10-CM | POA: Diagnosis not present

## 2016-04-08 DIAGNOSIS — Z7982 Long term (current) use of aspirin: Secondary | ICD-10-CM | POA: Diagnosis not present

## 2016-04-08 DIAGNOSIS — E7800 Pure hypercholesterolemia, unspecified: Secondary | ICD-10-CM | POA: Insufficient documentation

## 2016-04-08 DIAGNOSIS — M1990 Unspecified osteoarthritis, unspecified site: Secondary | ICD-10-CM | POA: Diagnosis not present

## 2016-04-08 DIAGNOSIS — Z8719 Personal history of other diseases of the digestive system: Secondary | ICD-10-CM | POA: Diagnosis not present

## 2016-04-08 DIAGNOSIS — C44500 Unspecified malignant neoplasm of anal skin: Secondary | ICD-10-CM

## 2016-04-08 NOTE — Progress Notes (Signed)
[facility] OFF[facility] PROGRESS NOTE  Patient Care Team: Chrisandra Carota, MD as PCP - General (Family Medicine) Hayden Pedro, MD (Ophthalmology)  No matching staging information was found for the patient.   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 PET 2016-rectal/anal uptake; No distant metastatic disease/lymphadenopathy. PLAN Surveillance  # History of sarcoidosis- PET scan-calcified granulomas/mild hilar/mediastinal adenopathy; # Colo [Nov 2016; Dr.Oh]-negative for any lesions.      Cancer of anal margin   08/20/2015 Initial Diagnosis Cancer of anal margin    INTERVAL HISTORY:  Pleasant 76 year old female patient with above history of stage I anal cancer is here for follow-up. Patient denies any blood in stools black stools. Denies any pain.  Patient had been evaluated with a endoscopy in April 2017 with Dr. Tamala Julian. Overall she feels well.  REVIEW OF SYSTEMS:  A complete 10 point review of system is done which is negative except mentioned above/history of present illness.   PAST MEDICAL HISTORY :  Past Medical History  Diagnosis Date  . Sarcoidosis ([place])   . Asthma   . Irregular heartbeat   . TIA (transient ischemic attack)   . Osteopenia   . Macular degeneration   . Mitral valve prolapse   . Heart murmur   . Shortness of breath dyspnea     with exertion  . Arthritis   . Glaucoma (increased eye pressure)   . Headache   . Hyperlipidemia   . Colon polyp 05/22/2013  . Neck pain   . Sarcoidosis ([place])   . Localized primary osteoarthritis of lower leg   . Hypercholesterolemia   . Carotid stenosis   . Foot mass   . Cancer [facility])     anal    PAST SURGICAL HISTORY :   Past Surgical History  Procedure Laterality Date  . Hysterectomy with  ovaries intact    . Cataract surgery  08/18/09    left eye  . Abdominal hysterectomy    . Eye surgery Bilateral     cataract extraction  . Tonsillectomy    . Dilation and curettage of uterus    . Hemorrhoid surgery N/A 07/24/2015    Procedure: HEMORRHOIDECTOMY/INTERNAL AND EXTERNAL;  Surgeon: Leonie Green, MD;  Location: [facility] ORS;  Service: General;  Laterality: N/A;  . Colonoscopy  05/22/2013  . Mass excision N/A 08/21/2015    Procedure: EXCISION MASS/ ANAL MASS;  Surgeon: Leonie Green, MD;  Location: [facility] ORS;  Service: General;  Laterality: N/A;  . Colonoscopy with propofol N/A 09/15/2015    Procedure: COLONOSCOPY WITH PROPOFOL;  Surgeon: Hulen Luster, MD;  Location: [facility] ENDOSCOPY;  Service: Gastroenterology;  Laterality: N/A;  . Tumor excision N/A 09/16/2015    Procedure: TUMOR EXCISION RECTAL;  Surgeon: Leonie Green, MD;  Location: [facility] ORS;  Service: General;  Laterality: N/A;    FAMILY HISTORY :  No family history on file.  SOCIAL HISTORY:   Social History  Substance Use Topics  . Smoking status: Never Smoker   . Smokeless tobacco: Never Used  . Alcohol Use: No    ALLERGIES:  is allergic to aspirin; dairy aid; diazepam; and shellfish-derived products.  MEDICATIONS:  Current Outpatient Prescriptions  Medication Sig Dispense Refill  . acyclovir (ZOVIRAX) 800 MG tablet Take 1 tablet (800 mg total) by mouth 5 (  five) times daily. 35 tablet 0  . ALPHAGAN P 0.15 % ophthalmic solution Place 2 drops into the left eye 2 (two) times daily.     Marland Kitchen aspirin EC 81 MG tablet Take 81 mg by mouth daily.    Marland Kitchen lidocaine (LIDODERM) 5 % Place 1 patch onto the skin daily as needed. Remove & Discard patch within 12 hours or as directed by MD    . Lidocaine 0.5 % GEL Apply 1 application topically 3 (three) times daily as needed. 35.44 g 0  . loperamide (IMODIUM) 2 MG capsule Take by mouth as needed for diarrhea or loose stools.    . Magnesium 400 MG TABS Take 1 tablet by mouth  every morning.     Marland Kitchen MAXAIR AUTOHALER 200 MCG/INH inhaler Inhale 2 puffs into the lungs as needed.    . Multiple Vitamin (MULTIVITAMIN WITH MINERALS) TABS Take 1 tablet by mouth daily. Centrum Silver    . Multiple Vitamins-Minerals (PRESERVISION AREDS) CAPS Take 1 capsule by mouth daily.    Marland Kitchen omeprazole (PRILOSEC) 20 MG capsule Take 1 capsule by mouth 2 (two) times daily.  11  . potassium chloride (K-DUR) 10 MEQ tablet Take 20 mEq by mouth 2 (two) times daily.    Marland Kitchen QVAR 80 MCG/ACT inhaler USE 2 INHALATIONS ORALLY   TWICE DAILY 26.1 g 0  . rosuvastatin (CRESTOR) 5 MG tablet Take 5 mg by mouth every morning.     . sucralfate (CARAFATE) 1 GM/10ML suspension Take 1 g by mouth 4 (four) times daily -  with meals and at bedtime.    . traMADol (ULTRAM) 50 MG tablet Take 1 tablet (50 mg total) by mouth 3 (three) times daily as needed. 15 tablet 0  . zolpidem (AMBIEN) 5 MG tablet Take 5 mg by mouth at bedtime as needed for sleep.    . cholecalciferol (VITAMIN D) 1000 UNITS tablet Take 1,000 Units by mouth daily.     Marland Kitchen triamcinolone ointment (KENALOG) 0.5 % Apply 1 application topically 2 (two) times daily. (Patient not taking: Reported on 04/08/2016) 30 g 0   No current facility-administered medications for this visit.    PHYSICAL EXAMINATION: ECOG PERFORMANCE STATUS: 0 - Asymptomatic  BP 147/80 mmHg  Pulse 59  Temp(Src) 96.4 F (35.8 C) (Tympanic)  Resp 17  Ht 5\' 7"  (1.702 m)  Wt 165 lb 12.6 oz (75.2 kg)  BMI 25.96 kg/m2  Filed Weights   04/08/16 1113  Weight: 165 lb 12.6 oz (75.2 kg)    GENERAL: Well-nourished well-developed; Alert, no distress and comfortable.  Accompanied by daughter. EYES: no pallor or icterus OROPHARYNX: no thrush or ulceration; good dentition  NECK: supple, no masses felt LYMPH:  no palpable lymphadenopathy in the cervical, axillary or inguinal regions LUNGS: clear to auscultation and  No wheeze or crackles HEART/CVS: regular rate & rhythm and no murmurs; No lower  extremity edema ABDOMEN:abdomen soft, non-tender and normal bowel sounds Musculoskeletal:no cyanosis of digits and no clubbing  PSYCH: alert & oriented x 3 with fluent speech NEURO: no focal motor/sensory deficits SKIN:  no rashes or significant lesions  LABORATORY DATA:  I have reviewed the data as listed    Component Value Date/Time   NA 141 07/15/2015 1407   K 3.9 07/15/2015 1407   CL 101 07/15/2015 1407   CO2 31 07/15/2015 1407   GLUCOSE 92 07/15/2015 1407   BUN 15 07/15/2015 1407   CREATININE 0.70 11/13/2015 0958   CALCIUM 10.1 07/15/2015 1407   PROT  7.4 07/15/2015 1407   ALBUMIN 4.0 07/15/2015 1407   AST 18 07/15/2015 1407   ALT 17 07/15/2015 1407   ALKPHOS 69 07/15/2015 1407   BILITOT 0.5 07/15/2015 1407   GFRNONAA >60 07/15/2015 1407   GFRAA >60 07/15/2015 1407    No results found for: SPEP, UPEP  Lab Results  Component Value Date   WBC 6.6 07/15/2015   NEUTROABS 4.9 07/15/2015   HGB 13.3 07/15/2015   HCT 40.3 07/15/2015   MCV 96.2 07/15/2015   PLT 306 07/15/2015      Chemistry      Component Value Date/Time   NA 141 07/15/2015 1407   K 3.9 07/15/2015 1407   CL 101 07/15/2015 1407   CO2 31 07/15/2015 1407   BUN 15 07/15/2015 1407   CREATININE 0.70 11/13/2015 0958      Component Value Date/Time   CALCIUM 10.1 07/15/2015 1407   ALKPHOS 69 07/15/2015 1407   AST 18 07/15/2015 1407   ALT 17 07/15/2015 1407   BILITOT 0.5 07/15/2015 1407       ASSESSMENT & PLAN:  Cancer of anal margin Stage I anal cancer/Margin. Status post resection 2; positive for AIN. No further therapy recommended/continue surveillance. Clinically no evidence of recurrence. Appointment with Dr. Tamala Julian in October 2017   # Follow-up with me in one year. All questions were answered. The patient knows to call the clinic with any problems, questions or concerns.   No orders of the defined types were placed in this encounter.      Cammie Sickle, MD 04/08/2016 5:32 PM

## 2016-04-08 NOTE — Progress Notes (Signed)
Pt reports no changes last visit.  No concerns.  Pt reported diarrhea a few weeks ago but related to changes eating habits and eating more vegetables.

## 2016-04-08 NOTE — Assessment & Plan Note (Addendum)
Stage I anal cancer/Margin. Status post resection 2; positive for AIN. No further therapy recommended/continue surveillance. Clinically no evidence of recurrence. Appointment with Dr. Tamala Julian in October 2017

## 2016-04-11 ENCOUNTER — Other Ambulatory Visit: Payer: Self-pay | Admitting: Internal Medicine

## 2016-04-14 ENCOUNTER — Other Ambulatory Visit: Payer: Self-pay | Admitting: Orthopedic Surgery

## 2016-04-14 DIAGNOSIS — M1712 Unilateral primary osteoarthritis, left knee: Secondary | ICD-10-CM

## 2016-04-14 DIAGNOSIS — M25562 Pain in left knee: Secondary | ICD-10-CM

## 2016-05-04 ENCOUNTER — Ambulatory Visit
Admission: RE | Admit: 2016-05-04 | Discharge: 2016-05-04 | Disposition: A | Discharge status: Home / Self Care | Payer: Medicare Other | Source: Ambulatory Visit | Attending: Orthopedic Surgery | Admitting: Orthopedic Surgery

## 2016-05-04 DIAGNOSIS — S83242A Other tear of medial meniscus, current injury, left knee, initial encounter: Secondary | ICD-10-CM | POA: Insufficient documentation

## 2016-05-04 DIAGNOSIS — S83282A Other tear of lateral meniscus, current injury, left knee, initial encounter: Secondary | ICD-10-CM | POA: Diagnosis not present

## 2016-05-04 DIAGNOSIS — M1712 Unilateral primary osteoarthritis, left knee: Secondary | ICD-10-CM | POA: Diagnosis present

## 2016-05-04 DIAGNOSIS — M25562 Pain in left knee: Secondary | ICD-10-CM | POA: Insufficient documentation

## 2016-05-04 DIAGNOSIS — M25462 Effusion, left knee: Secondary | ICD-10-CM | POA: Insufficient documentation

## 2016-05-04 DIAGNOSIS — X58XXXA Exposure to other specified factors, initial encounter: Secondary | ICD-10-CM | POA: Diagnosis not present

## 2016-06-03 ENCOUNTER — Telehealth: Payer: Self-pay | Admitting: Internal Medicine

## 2016-06-03 ENCOUNTER — Ambulatory Visit: Payer: Medicare Other | Admitting: Internal Medicine

## 2016-06-03 DIAGNOSIS — D869 Sarcoidosis, unspecified: Secondary | ICD-10-CM

## 2016-06-03 NOTE — Telephone Encounter (Signed)
Called and spoke to daughter, Anegenella. Informed her that MW was ok with CXR prior to appt. Order placed. Angenella verbalized understanding and denied any further questions or concerns at this time.

## 2016-06-03 NOTE — Telephone Encounter (Signed)
Spoke with pt's daughter, Angenella. She was the one who call, no the pt. They are wanting to know if MW would order a CXR tomorrow at her appointment. Her doctor at [facility] is wanting this done.  MW - please advise. Thanks.

## 2016-06-03 NOTE — Telephone Encounter (Signed)
That's fine

## 2016-06-04 ENCOUNTER — Ambulatory Visit (INDEPENDENT_AMBULATORY_CARE_PROVIDER_SITE_OTHER): Payer: Medicare Other | Admitting: Internal Medicine

## 2016-06-04 ENCOUNTER — Other Ambulatory Visit: Payer: Self-pay | Admitting: Internal Medicine

## 2016-06-04 ENCOUNTER — Encounter: Payer: Self-pay | Admitting: Internal Medicine

## 2016-06-04 ENCOUNTER — Ambulatory Visit (INDEPENDENT_AMBULATORY_CARE_PROVIDER_SITE_OTHER)
Admission: RE | Admit: 2016-06-04 | Discharge: 2016-06-04 | Disposition: A | Discharge status: Home / Self Care | Payer: Medicare Other | Source: Ambulatory Visit | Attending: Internal Medicine | Admitting: Internal Medicine

## 2016-06-04 VITALS — BP 122/80 | HR 80 | Ht 67.0 in | Wt 167.0 lb

## 2016-06-04 DIAGNOSIS — J4530 Mild persistent asthma, uncomplicated: Secondary | ICD-10-CM

## 2016-06-04 DIAGNOSIS — D869 Sarcoidosis, unspecified: Secondary | ICD-10-CM | POA: Diagnosis not present

## 2016-06-04 MED ORDER — BECLOMETHASONE DIPROPIONATE 80 MCG/ACT IN AERS: INHALATION_SPRAY | RESPIRATORY_TRACT | 3 refills | Status: DC

## 2016-06-04 MED ORDER — PIRBUTEROL ACETATE 200 MCG/INH IN AERB: 2.0000 | INHALATION_SPRAY | RESPIRATORY_TRACT | 0 refills | Status: DC | PRN

## 2016-06-04 NOTE — Patient Instructions (Signed)
If you are satisfied with your treatment plan,  let your doctor know and he/she can either refill your medications or you can return here when your prescription runs out.     If in any way you are not 100% satisfied,  please tell us.  If 100% better, tell your friends!  Pulmonary follow up is as needed   

## 2016-06-04 NOTE — Assessment & Plan Note (Signed)
-  PFT's 02/05/09  FEV1  1.59 (72%) ratio 66 and DLCO 63 corrects to 109    - PFT's 06/28/2012 FEV1  1.30 (61%) ratio 59 and DLCO 62 corrects to 113    - changed to dulera 200 2bid 06/28/2012 > throat irritation > rechallenge with 100 2 puffs qam 10/02/12  > could not tolerate> rx qvar 80 2bid  All goals of chronic asthma control met including optimal function and elimination of symptoms with minimal need for rescue therapy.  Contingencies discussed in full including contacting this office immediately if not controlling the symptoms using the rule of two's.    

## 2016-06-04 NOTE — Assessment & Plan Note (Signed)
-   prednisone Rx 1983 ([facility]) > d/c  06/2008 Melvyn Novas)  - 06/04/2016  After extensive coaching HFA effectiveness =    90%  - as of 06/04/2016 f/u prn on qva 80 2bid maint rx   I had an extended final summary discussion with the patient reviewing all relevant studies completed to date and  lasting 15 to 20 minutes of a 25 minute visit on the following issues:    1) all chronic symptoms / airway complaints controlled on qvar 80 2bid with no systemic steroids chronically since 2009   2) no need for f/u cxr's or pulmonary visits at this point   3) Each maintenance medication was reviewed in detail including most importantly the difference between maintenance and as needed and under what circumstances the prns are to be used.  Please see instructions for details which were reviewed in writing and the patient given a copy.     Pulmonary f/u is prn

## 2016-06-04 NOTE — Progress Notes (Signed)
Subjective:     Patient ID: Lisa [name], female   DOB: 1941-02-24    MRN: [id number]    Brief patient profile:  33 yobf never smoker diagnosed with sarcoidosis with parenchymal and airway involvement in 1983 at [facility]. At one point she also had significant sinus involvement and had remained prednisone dependent since 1983 and finally  Stopped daily prednisone  06/2008     History of Present Illness   05/24/2014 f/u ov/Vernel Donlan re: sarcoid/asthma  Chief Complaint  Patient presents with  . Follow-up    Pt states that her breathing is doing well.  Seldom needs rescue inhaler. No new co's today.   No chronic prednisone needed Not limited by breathing from desired activities   rec No change rx   06/03/2015 f/u ov/Carlyon Nolasco re: sarcoidosis/ acute nasal congestion x 2weeks  maint rx qvar 80 2bid / no need for saba  Chief Complaint  Patient presents with  . Follow-up    Pt c/o nasal congestion for the past 2 wks. Her breathing is overall doing well.   abrupt onset with white mucus/ nothing purulent/ no cough or wheeze or sob - no response to mucinex dm  rec No change rx   06/04/2016  f/u ov/Vannah Nadal re: sarcoid/asthma on qvar 80 2 bid  Chief Complaint  Patient presents with  . Follow-up    CXR done today. Pt states doing well and denies any new co's today.      Not limited by breathing from desired activities     No obvious day to day or daytime variability or assoc chronic cough or cp or chest tightness, subjective wheeze or overt sinus or hb symptoms. No unusual exp hx or h/o childhood pna/ asthma or knowledge of premature birth.  Sleeping ok without nocturnal  or early am exacerbation  of respiratory  c/o's or need for noct saba. Also denies any obvious fluctuation of symptoms with weather or environmental changes or other aggravating or alleviating factors except as outlined above   Current Medications, Allergies, Complete Past Medical History, Past Surgical History, Family History, and Social  History were reviewed in [employer] record.  ROS  The following are not active complaints unless bolded sore throat, dysphagia, dental problems, itching, sneezing,  nasal congestion or excess/ purulent secretions, ear ache,   fever, chills, sweats, unintended wt loss, classically pleuritic or exertional cp, hemoptysis,  orthopnea pnd or leg swelling, presyncope, palpitations, abdominal pain, anorexia, nausea, vomiting, diarrhea  or change in bowel or bladder habits, change in stools or urine, dysuria,hematuria,  rash, arthralgias, visual complaints, headache, numbness, weakness or ataxia or problems with walking or coordination,  change in mood/affect or memory.                     Past Medical History:  SARCOIDOSIS (ICD-135)  - On prednisone 1983 ([facility]) > off 06/2008 ([facility])  - PFT's 04/24/07 FEV1 1.62 (72%) ratio 63, no resp to Bronchodilators, DLC0 61% (on prednisone)  - PFT's 02/05/09 FEV1 1.59 (72%) ratio 66 no resp to bronchodilators DLC0 63 (off prednisone)  - HFA technique 100% February 05, 2009 with autohaler,  100% 08/06/2011  MVP syndrome...............................................................................................Marland Kitchen Dr. Pauline Aus  Osteopenia..  - DEXA 09/08/2009 T spine 1.2, L Fem Neck -1.6, R Fem neck -1.9  Food allergies dx at [facility]  - Referred to [facility] for food allergies May 20, 2010         Objective:   Physical Exam   anxious but  pleasant amb bf nad   wt 160 August 19, 2009 > 170 January 16, 2010 > 165 08/06/2011 > 02/14/2012  173 >  06/28/2012 164  > 174  10/02/2012 > 11/06/2012  173 > 12/26/2013 170 > 05/24/2014 167 > 06/03/2015   169 > 06/04/2016   167   HEENT mild turbinate edema. Oropharynx no thrush or excess pnd or cobblestoning. No JVD or cervical adenopathy. Mild accessory muscle hypertrophy. Trachea midline, nl thryroid. Chest was hyperinflated by percussion with diminished breath sounds and moderate increased exp time without wheeze.  Hoover sign positive at mid inspiration. Regular rate and rhythm  II/VI SEM  without   gallop or rub or increase P2 or edema. Abd: no hsm, nl excursion. Ext warm without cyanosis or clubbing.         CXR PA and Lateral:   06/04/2016 :    I personally reviewed images and agree with radiology impression as follows:    Severe chronic lung disease related to sarcoidosis. No acute overlying pulmonary findings.     Assessment:

## 2016-06-10 ENCOUNTER — Telehealth: Payer: Self-pay | Admitting: Internal Medicine

## 2016-06-10 MED ORDER — ALBUTEROL SULFATE HFA 108 (90 BASE) MCG/ACT IN AERS: 2.0000 | INHALATION_SPRAY | Freq: Four times a day (QID) | RESPIRATORY_TRACT | 1 refills | Status: AC | PRN

## 2016-06-10 NOTE — Telephone Encounter (Signed)
[facility] called. Maxair is no longer on the market. Qvar was sent to them earlier this month. They are wanting to know if this is replacing Maxair. If it's not, an alternative will need to be sent to them.  MW - please advise. Thanks.  Instructions   If you are satisfied with your treatment plan,  let your doctor know and he/she can either refill your medications or you can return here when your prescription runs out.     If in any way you are not 100% satisfied,  please tell us.  If 100% better, tell your friends!  Pulmonary follow up is as needed

## 2016-06-10 NOTE — Telephone Encounter (Signed)
I filled out the paperwork for proair to replace maxair and this has nothing to do with the qvar rx which stays the same

## 2016-06-10 NOTE — Telephone Encounter (Signed)
Rx for Proair has been sent in per MW. Nothing further was needed.

## 2016-06-13 IMAGING — CT NM PET TUM IMG INITIAL (PI) SKULL BASE T - THIGH
10 series · 22 of 25 positions shown · non-contrast
Comparison: No priors.

ADDENDUM:
The presence of some focal soft tissue thickening and
hypermetabolism adjacent to the ileocecal valve was discussed in the
original report, but was omitted from the impression section. This
may simply represent metabolic activity in an area of normal mucosa,
however, the possibility of a synchronous colonic neoplasm in this
area is not excluded, and correlation with colonoscopy is suggested.

These results will be called to the ordering clinician or
representative by the Radiologist Assistant, and communication
documented in the PACS or zVision Dashboard.
CLINICAL DATA: Initial Treatment strategy for anal cancer.
EXAM:
NUCLEAR MEDICINE PET SKULL BASE TO THIGH
TECHNIQUE: 13.1 mCi F-18 FDG was injected intravenously. Full-ring PET imaging
was performed from the skull base to thigh after the radiotracer. CT
data was obtained and used for attenuation correction and anatomic
localization.
FASTING BLOOD GLUCOSE:  Value: 71 mg/dl

[Series 3: ct wb 5.0 b30f · axial · 5.0mm · 0.98mm/px · z∈[-1466,-600]mm · 3 of 290 slices shown]
[im 1/290]
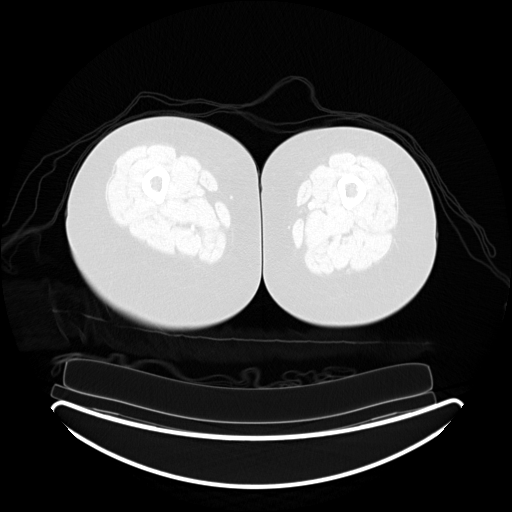
[im 145/290]
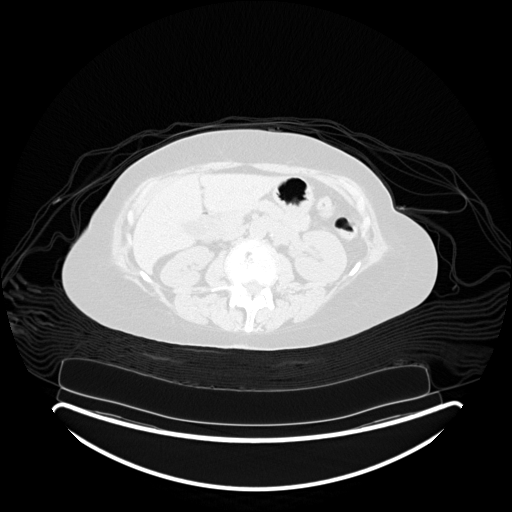
[im 290/290  brain]
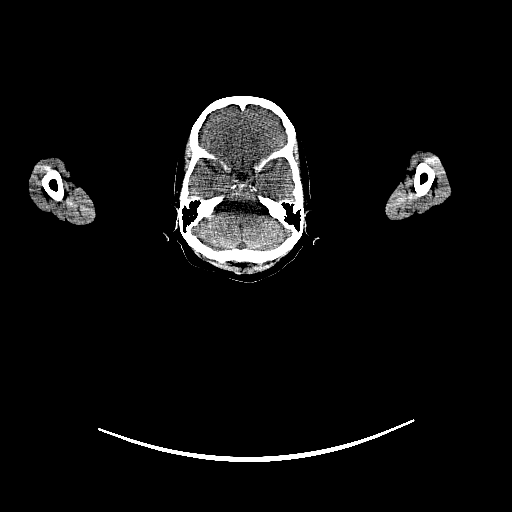

[Series 4: pet wb (ac) · axial · 5.0mm · 4.07mm/px · z∈[-1466,-600]mm · 2 of 290 slices shown]
[im 1/290]
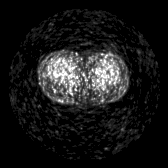
[im 290/290]
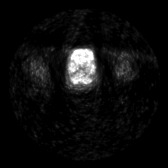

[Series 5: pet wb uncorrected (nac) · axial · 5.0mm · 4.07mm/px · z∈[-1466,-600]mm · 4 of 290 slices shown]
[im 1/290]
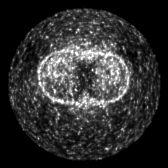
[im 97/290]
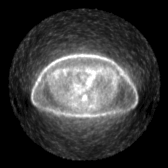
[im 193/290]
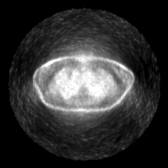
[im 290/290]
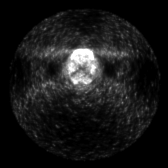

[Series 603: pet/ct fused axial · 3 of 289 slices shown]
[im 1/289]
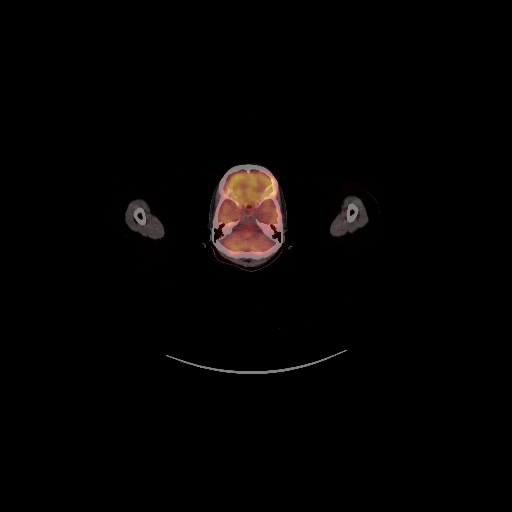
[im 97/289]
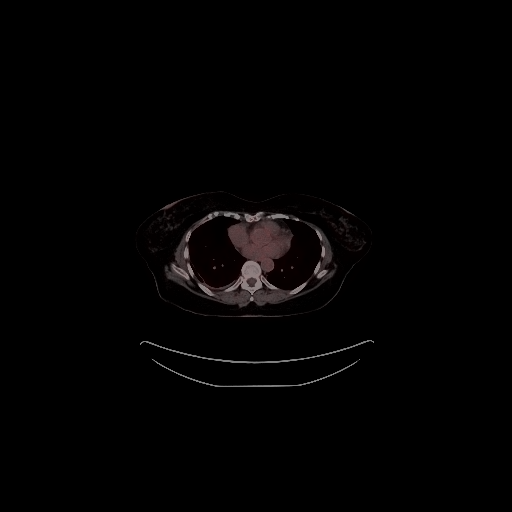
[im 289/289]
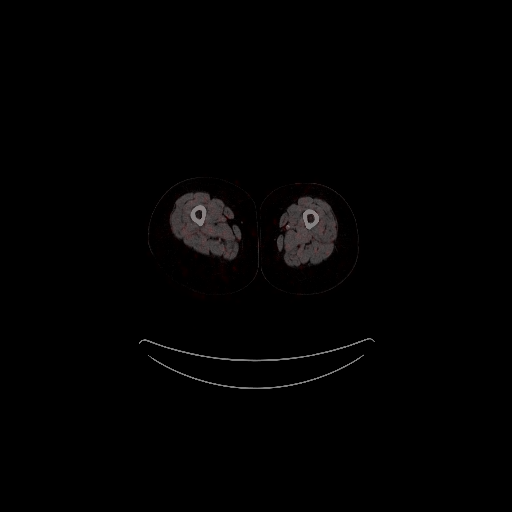

[Series 604: pet/ct fused coronal · 1 of 92 slices shown]
[im 1/92]
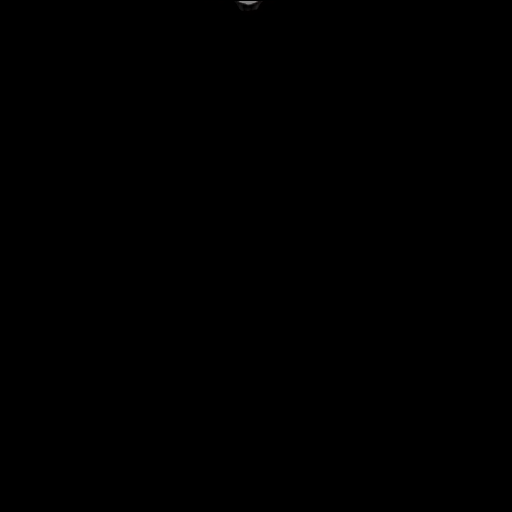

[Series 605: pet/ct fused sagittal · 2 of 155 slices shown]
[im 1/155]
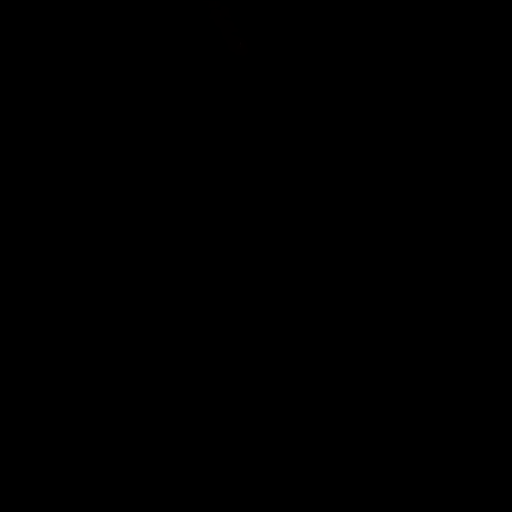
[im 155/155]
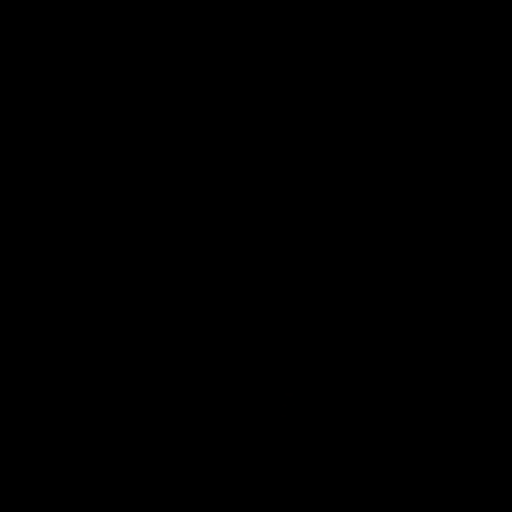

[Series 606: pet axial · 3 of 289 slices shown]
[im 1/289]
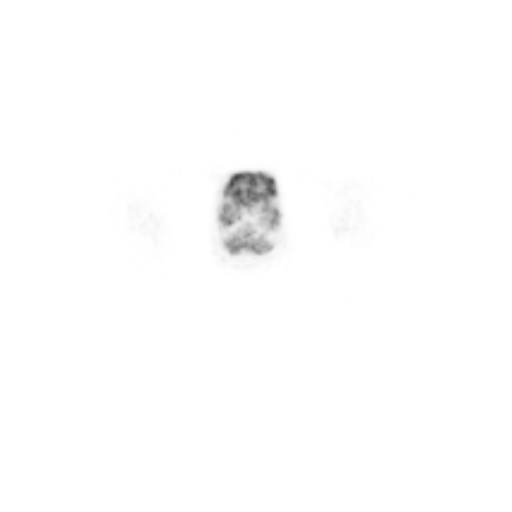
[im 97/289]
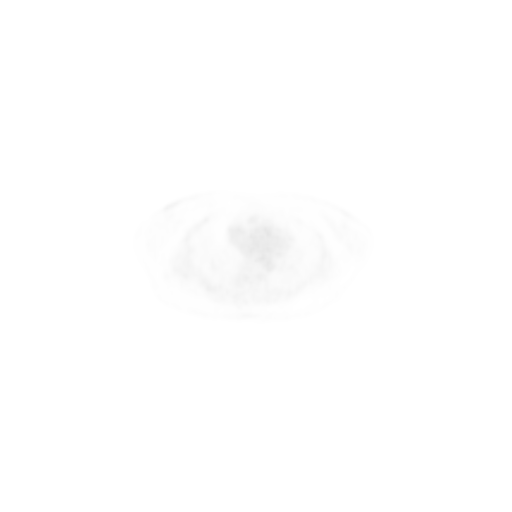
[im 193/289]
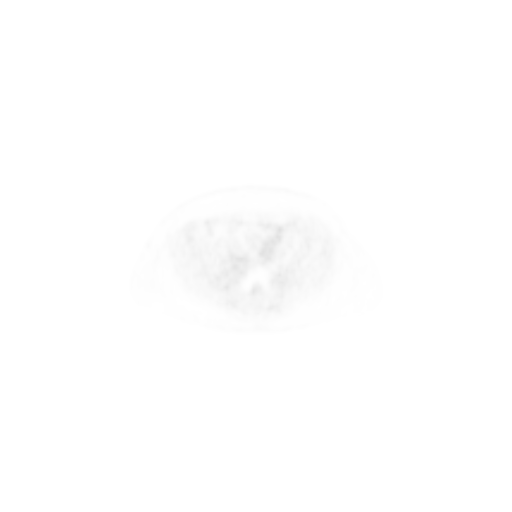

[Series 607: pet coronal · 1 of 90 slices shown]
[im 1/90]
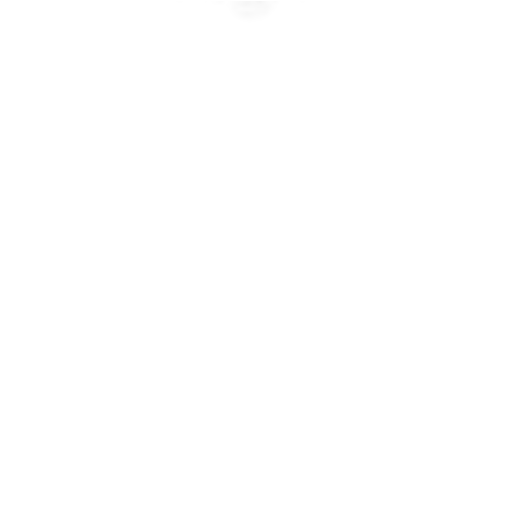

[Series 608: pet sagittal · 2 of 141 slices shown]
[im 1/141]
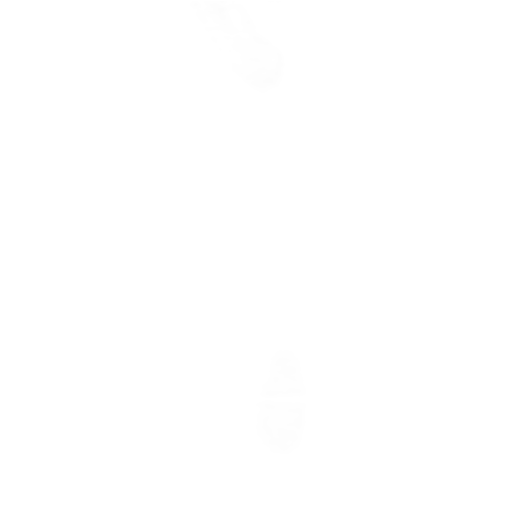
[im 141/141]
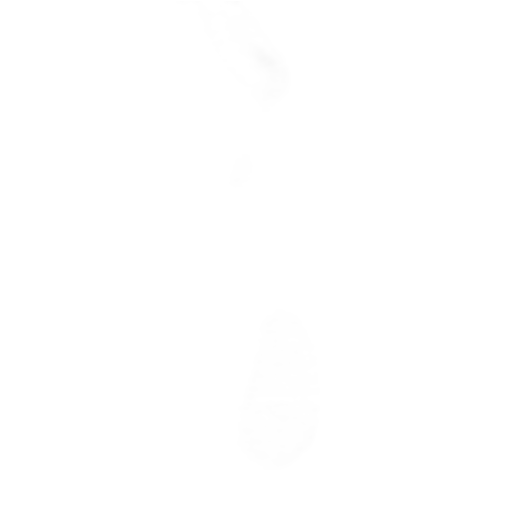

[Series 1038: results mm oncology reading · 0.92mm/px · 1 of 6 slices shown]
[im 1/6]
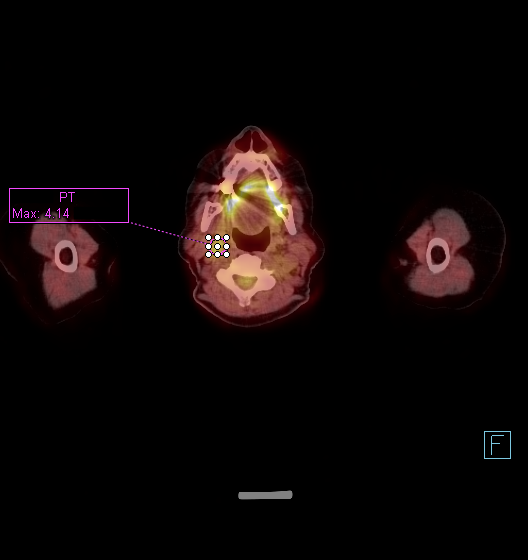

[22 of 25 positions shown; findings below may reference images not displayed]

FINDINGS: NECK

Borderline enlarged 8 mm right level IIa lymph node (image 24 of
series 3) is mildly hypermetabolic (SUVmax = 4.1). Nonenlarged 7 mm
short axis left level III lymph node (image 33 of series 3) also
demonstrates low-level hypermetabolism (SUVmax = 3.7).

CHEST

Heart size is borderline enlarged. There is no significant
pericardial fluid, thickening or pericardial calcification. Numerous
calcified mediastinal and bilateral hilar lymph nodes are noted.
Esophagus is unremarkable in appearance. Extensive areas of
architectural distortion and volume loss are noted, predominantly in
the upper lungs, with associated upward retraction of hilar
structures. Scattered tiny pulmonary nodules are noted predominantly
in the subpleural aspects of the lungs. Some upper lobe cylindrical
and mild varicose bronchiectasis is noted. No suspicious appearing
hypermetabolic pulmonary nodules or masses. No hypermetabolic
lymphadenopathy in the mediastinum. Trace amount of right pleural
fluid and/or thickening lying dependently (no associated
hypermetabolism).

ABDOMEN/PELVIS

Hypermetabolism in the distal rectum and anus (SUVmax = 7.1-8.7).
The wall of the distal rectum and anus in this region appears only
minimally thickened without a discrete mass. In addition, there is
some focal mural thickening of the cecum immediately adjacent to the
ileocecal valve where there is some low-level hypermetabolism
(SUVmax = 5.9). No abnormal hypermetabolic activity within the
liver, pancreas, adrenal glands, or spleen. No hypermetabolic lymph
nodes in the abdomen or pelvis. Numerous tiny calcified gallstones
lie dependently in the gallbladder. No findings to suggest acute
cholecystitis at this time. Numerous colonic diverticulae are noted,
particularly in the sigmoid colon, without surrounding inflammatory
changes to suggest an acute diverticulitis at this time.

SKELETON

No focal hypermetabolic activity to suggest skeletal metastasis.
IMPRESSION: 1. Hypermetabolism in the distal rectum and anus, corresponding to
the biopsy-proven neoplasm. No surrounding lymphadenopathy to
suggest local lymphatic spread of disease, and no definite evidence
of metastatic disease in the neck, chest, abdomen or pelvis.
2. There are 2 nonenlarged mildly hypermetabolic cervical lymph
nodes. This would be an extremely unlikely location for nodal
metastasis from an anorectal neoplasm. As an isolated finding, these
lymph nodes are of uncertain etiology and significance. However,
there is a spectrum of findings in the chest suggestive of
underlying sarcoidosis, which could account for some mild
hypermetabolism in these lymph nodes. Further clinical correlation
and attention on followup studies to ensure stability or resolution
is suggested.
3. Cholelithiasis without evidence of acute cholecystitis at this
time.
4. Colonic diverticulosis without evidence of acute diverticulitis
at this time.
5. Additional incidental findings, as above.

## 2016-06-30 ENCOUNTER — Other Ambulatory Visit: Payer: Self-pay | Admitting: Internal Medicine

## 2016-07-10 ENCOUNTER — Encounter: Payer: Self-pay | Admitting: Emergency Medicine

## 2016-07-10 ENCOUNTER — Emergency Department
Admission: EM | Admit: 2016-07-10 | Discharge: 2016-07-10 | Disposition: A | Discharge status: Home / Self Care | Payer: Medicare Other | Attending: Emergency Medicine | Admitting: Emergency Medicine

## 2016-07-10 DIAGNOSIS — Z7982 Long term (current) use of aspirin: Secondary | ICD-10-CM | POA: Diagnosis not present

## 2016-07-10 DIAGNOSIS — Z791 Long term (current) use of non-steroidal anti-inflammatories (NSAID): Secondary | ICD-10-CM | POA: Diagnosis not present

## 2016-07-10 DIAGNOSIS — J4530 Mild persistent asthma, uncomplicated: Secondary | ICD-10-CM | POA: Insufficient documentation

## 2016-07-10 DIAGNOSIS — Z79899 Other long term (current) drug therapy: Secondary | ICD-10-CM | POA: Diagnosis not present

## 2016-07-10 DIAGNOSIS — Z85048 Personal history of other malignant neoplasm of rectum, rectosigmoid junction, and anus: Secondary | ICD-10-CM | POA: Diagnosis not present

## 2016-07-10 DIAGNOSIS — R21 Rash and other nonspecific skin eruption: Secondary | ICD-10-CM | POA: Diagnosis present

## 2016-07-10 DIAGNOSIS — B029 Zoster without complications: Secondary | ICD-10-CM | POA: Diagnosis not present

## 2016-07-10 MED ORDER — ACYCLOVIR 800 MG PO TABS: 800.0000 mg | ORAL_TABLET | Freq: Every day | ORAL | 0 refills | Status: AC

## 2016-07-10 MED ORDER — PREDNISONE 10 MG PO TABS: 10.0000 mg | ORAL_TABLET | Freq: Every day | ORAL | 0 refills | Status: DC

## 2016-07-10 MED ORDER — PREDNISONE 20 MG PO TABS
60.0000 mg | ORAL_TABLET | Freq: Once | ORAL | Status: AC
  Administered 2016-07-10: 60 mg via ORAL
  Filled 2016-07-10: qty 3

## 2016-07-10 MED ORDER — ACYCLOVIR 200 MG PO CAPS
800.0000 mg | ORAL_CAPSULE | Freq: Once | ORAL | Status: AC
  Administered 2016-07-10: 800 mg via ORAL
  Filled 2016-07-10: qty 4

## 2016-07-10 NOTE — Discharge Instructions (Signed)
Please take medications as prescribed, follow-up with her primary care physician. Return to ER for any worsening symptoms or urgent changes in her health. If any facial rash or vision changes, return to the emergency Department immediately.

## 2016-07-10 NOTE — ED Provider Notes (Signed)
[facility] Provider Note   CSN: [id number] Arrival date & time: 07/10/16  2022     History   Chief Complaint Chief Complaint  Patient presents with  . Herpes Zoster    HPI Lisa [name] is a 75 y.o. female presents today for evaluation of rash to the left side of her neck 3 days. Rash is erythematous and burning. She's had a history of shingles in the past, states this feels the same way. She denies any facial rash, burning, vision changes or eye pain. She denies any headache or fevers. She has 2 small areas to the left side of her neck. Her pain is moderate. She's been trying to get relief with triamcinolone cream. Patient has responded well to acyclovir and prednisone in the past.  HPI  Past Medical History:  Diagnosis Date  . Arthritis   . Asthma   . Cancer (HCC)    anal  . Carotid stenosis   . Colon polyp 05/22/2013  . Foot mass   . Glaucoma (increased eye pressure)   . Headache   . Heart murmur   . Hypercholesterolemia   . Hyperlipidemia   . Irregular heartbeat   . Localized primary osteoarthritis of lower leg   . Macular degeneration   . Mitral valve prolapse   . Neck pain   . Osteopenia   . Sarcoidosis ([place])   . Sarcoidosis ([place])   . Shortness of breath dyspnea    with exertion  . TIA (transient ischemic attack)     Patient Active Problem List   Diagnosis Date Noted  . Asthma without status asthmaticus 09/25/2015  . Cancer of anal margin 08/20/2015  . Chest pain 07/23/2015  . Chronic rhinitis 06/04/2015  . Near syncope 09/12/2014  . Primary osteoarthritis of both knees 08/16/2014  . Muscle strain 05/17/2014  . Cephalalgia 04/10/2014  . LBP (low back pain) 04/10/2014  . Cervical pain 04/10/2014  . Decreased potassium in the blood 03/25/2014  . Arthritis of knee, degenerative 01/25/2014  . 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 Foot mass 06/05/2013  . Arthritis of foot, degenerative 06/05/2013  .  Carotid artery narrowing 03/30/2013  . Cardiac murmur 03/07/2013  . Persons encountering health services in other specified circumstances 10/26/2012  . Airway hyperreactivity 08/25/2012  . Hypercholesterolemia 08/25/2012  . Degeneration macular 08/25/2012  . Carotid stenosis, bilateral 07/21/2012  . Idiopathic localized osteoarthropathy 02/17/2012  . Asthma, mild persistent 02/14/2012  . URI (upper respiratory infection) 11/15/2011  . ALLERGY, FOOD 05/20/2010  . PULMONARY NODULE, LEFT UPPER LOBE 08/06/2008  . Disease of lung 08/06/2008  . Respiratory insufficiency 08/06/2008  . Sarcoidosis with airway involvement  08/22/2007    Past Surgical History:  Procedure Laterality Date  . ABDOMINAL HYSTERECTOMY    . cataract surgery  08/18/09   left eye  . COLONOSCOPY  05/22/2013  . COLONOSCOPY WITH PROPOFOL N/A 09/15/2015   Procedure: COLONOSCOPY WITH PROPOFOL;  Surgeon: Hulen Luster, MD;  Location: [facility] ENDOSCOPY;  Service: Gastroenterology;  Laterality: N/A;  . DILATION AND CURETTAGE OF UTERUS    . EYE SURGERY Bilateral    cataract extraction  . HEMORRHOID SURGERY N/A 07/24/2015   Procedure: HEMORRHOIDECTOMY/INTERNAL AND EXTERNAL;  Surgeon: Leonie Green, MD;  Location: [facility] ORS;  Service: General;  Laterality: N/A;  . hysterectomy with ovaries intact    . MASS EXCISION N/A 08/21/2015   Procedure: EXCISION MASS/ ANAL MASS;  Surgeon: Leonie Green, MD;  Location: [facility] ORS;  Service: General;  Laterality: N/A;  . TONSILLECTOMY    . TUMOR EXCISION N/A 09/16/2015   Procedure: TUMOR EXCISION RECTAL;  Surgeon: Leonie Green, MD;  Location: [facility] ORS;  Service: General;  Laterality: N/A;    OB History    No data available       Home Medications    Prior to Admission medications   Medication Sig Start Date End Date Taking? Authorizing Provider  acyclovir (ZOVIRAX) 800 MG tablet Take 1 tablet (800 mg total) by mouth 5 (five) times daily. 07/10/16 07/17/16  Duanne Guess,  PA-C  albuterol (PROAIR HFA) 108 [phone] Base) MCG/ACT inhaler Inhale 2 puffs into the lungs every 6 (six) hours as needed for wheezing or shortness of breath. 06/10/16   Tanda Rockers, MD  ALPHAGAN P 0.15 % ophthalmic solution Place 2 drops into the left eye 2 (two) times daily.  06/23/12   Historical Provider, MD  aspirin EC 81 MG tablet Take 81 mg by mouth daily.    Historical Provider, MD  beclomethasone (QVAR) 80 MCG/ACT inhaler USE 2 INHALATIONS ORALLY   TWICE DAILY 06/04/16   Tanda Rockers, MD  cholecalciferol (VITAMIN D) 1000 UNITS tablet Take 1,000 Units by mouth daily.     Historical Provider, MD  lidocaine (LIDODERM) 5 % Place 1 patch onto the skin daily as needed. Remove & Discard patch within 12 hours or as directed by MD    Historical Provider, MD  Lidocaine 0.5 % GEL Apply 1 application topically 3 (three) times daily as needed. 01/07/16   Jenise V Bacon Menshew, PA-C  loperamide (IMODIUM) 2 MG capsule Take by mouth as needed for diarrhea or loose stools.    Historical Provider, MD  Magnesium 400 MG TABS Take 1 tablet by mouth every morning.     Historical Provider, MD  Multiple Vitamin (MULTIVITAMIN WITH MINERALS) TABS Take 1 tablet by mouth daily. Centrum Silver    Historical Provider, MD  Multiple Vitamins-Minerals (PRESERVISION AREDS) CAPS Take 1 capsule by mouth daily.    Historical Provider, MD  omeprazole (PRILOSEC) 20 MG capsule Take 1 capsule by mouth 2 (two) times daily. 07/23/15   Historical Provider, MD  pirbuterol (MAXAIR AUTOHALER) 200 MCG/INH inhaler Inhale 2 puffs into the lungs as needed. 06/04/16   Tanda Rockers, MD  potassium chloride (K-DUR) 10 MEQ tablet Take 20 mEq by mouth 2 (two) times daily.    Historical Provider, MD  predniSONE (DELTASONE) 10 MG tablet Take 1 tablet (10 mg total) by mouth daily. 6,5,4,3,2,1 six day taper 07/10/16   Duanne Guess, PA-C  QVAR 80 MCG/ACT inhaler USE 2 INHALATIONS ORALLY   TWICE DAILY 06/30/16   Tanda Rockers, MD  rosuvastatin  (CRESTOR) 5 MG tablet Take 5 mg by mouth every morning.     Historical Provider, MD  sucralfate (CARAFATE) 1 GM/10ML suspension Take 1 g by mouth 4 (four) times daily -  with meals and at bedtime.    Historical Provider, MD  traMADol (ULTRAM) 50 MG tablet Take 1 tablet (50 mg total) by mouth 3 (three) times daily as needed. 01/07/16   Jenise V Bacon Menshew, PA-C  triamcinolone ointment (KENALOG) 0.5 % Apply 1 application topically 2 (two) times daily. 01/07/16   Jenise V Bacon Menshew, PA-C  zolpidem (AMBIEN) 5 MG tablet Take 5 mg by mouth at bedtime as needed for sleep.    Historical Provider, MD    Family History No family history on file.  Social History  Social History  Substance Use Topics  . Smoking status: Never Smoker  . Smokeless tobacco: Never Used  . Alcohol use No     Allergies   Aspirin; Dairy aid [lactase]; Diazepam; and Shellfish-derived products   Review of Systems Review of Systems  Constitutional: Negative for activity change, chills, fatigue and fever.  HENT: Negative for congestion, sinus pressure and sore throat.   Eyes: Negative for photophobia, pain, discharge, redness, itching and visual disturbance.  Respiratory: Negative for cough, chest tightness and shortness of breath.   Cardiovascular: Negative for chest pain and leg swelling.  Gastrointestinal: Negative for abdominal pain, diarrhea, nausea and vomiting.  Genitourinary: Negative for dysuria.  Musculoskeletal: Negative for arthralgias and gait problem.  Skin: Positive for rash.  Neurological: Negative for weakness, numbness and headaches.  Hematological: Negative for adenopathy.  Psychiatric/Behavioral: Negative for agitation, behavioral problems and confusion.     Physical Exam Updated Vital Signs BP 128/73   Pulse 74   Temp 97.9 F (36.6 C) (Oral)   Resp 18   Ht 5\' 7"  (1.702 m)   Wt 76.7 kg   SpO2 97%   BMI 26.47 kg/m   Physical Exam  Constitutional: She is oriented to person, place,  and time. She appears well-developed and well-nourished. No distress.  HENT:  Head: Normocephalic and atraumatic.  Right Ear: External ear normal.  Left Ear: External ear normal.  Nose: Nose normal.  Mouth/Throat: Oropharynx is clear and moist.  Eyes: Conjunctivae, EOM and lids are normal. Pupils are equal, round, and reactive to light. Right eye exhibits no discharge. Left eye exhibits no discharge. Right conjunctiva is not injected. Left conjunctiva is not injected. Right eye exhibits normal extraocular motion. Left eye exhibits normal extraocular motion.  Neck: Normal range of motion. Neck supple.  Cardiovascular: Normal rate, regular rhythm and intact distal pulses.   Pulmonary/Chest: Effort normal and breath sounds normal. No respiratory distress. She exhibits no tenderness.  Abdominal: Soft. She exhibits no distension. There is no tenderness.  Musculoskeletal: Normal range of motion. She exhibits no edema.  Neurological: She is alert and oriented to person, place, and time. She has normal reflexes.  Skin: Skin is warm and dry. Rash noted.  Macular erythematous rash with no vesicles to the left side of the neck 2, both areas approximately 2 cm in diameter. Patient has no fluctuance, induration, warmth or drainage. No facial rash.   Psychiatric: She has a normal mood and affect. Her behavior is normal. Thought content normal.     ED Treatments / Results  Labs (all labs ordered are listed, but only abnormal results are displayed) Labs Reviewed - No data to display  EKG  EKG Interpretation None       Radiology No results found.  Procedures Procedures (including critical care time)  Medications Ordered in ED Medications  acyclovir (ZOVIRAX) 200 MG capsule 800 mg (not administered)  predniSONE (DELTASONE) tablet 60 mg (60 mg Oral Given 07/10/16 2128)     Initial Impression / Assessment and Plan / ED Course  I have reviewed the triage vital signs and the nursing  notes.  Pertinent labs & imaging results that were available during my care of the patient were reviewed by me and considered in my medical decision making (see chart for details).  Clinical Course    75 year old female with history of shingles, states she has a similar rash in appearance as well as sensation to the left side of her neck. There is no facial involvement no  ocular pain. She is started on prednisone, acyclovir and she will follow-up with her PCP. She will also use topical steroid creams.  Final Clinical Impressions(s) / ED Diagnoses   Final diagnoses:  Skin rash  Shingles rash    New Prescriptions New Prescriptions   ACYCLOVIR (ZOVIRAX) 800 MG TABLET    Take 1 tablet (800 mg total) by mouth 5 (five) times daily.   PREDNISONE (DELTASONE) 10 MG TABLET    Take 1 tablet (10 mg total) by mouth daily. 6,5,4,3,2,1 six day taper     Duanne Guess, PA-C 07/10/16 2134    Duanne Guess, PA-C 07/10/16 2135    Schuyler Amor, MD 07/10/16 [phone]

## 2016-07-10 NOTE — ED Triage Notes (Signed)
Patient states that she developed shingles today. Patient states that she noticed her neck burning this morning and developed a rash this evening. Patient with a history of shingles.

## 2016-09-15 DIAGNOSIS — I491 Atrial premature depolarization: Secondary | ICD-10-CM | POA: Insufficient documentation

## 2016-10-28 ENCOUNTER — Ambulatory Visit
Admission: RE | Admit: 2016-10-28 | Discharge: 2016-10-28 | Disposition: A | Discharge status: Home / Self Care | Payer: Medicare Other | Source: Ambulatory Visit | Attending: Radiation Oncology | Admitting: Radiation Oncology

## 2016-10-28 ENCOUNTER — Encounter: Payer: Self-pay | Admitting: Radiation Oncology

## 2016-10-28 VITALS — BP 134/78 | HR 92 | Temp 98.0°F | Resp 18 | Ht 67.0 in | Wt 166.6 lb

## 2016-10-28 DIAGNOSIS — C210 Malignant neoplasm of anus, unspecified: Secondary | ICD-10-CM

## 2016-10-28 NOTE — Progress Notes (Signed)
Radiation Oncology Follow up Note  Name: Lisa [name]   Date:   10/28/2016 MRN:  [id number] DOB: May 24, 1941    This 76 y.o. 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  REFERRING PROVIDER: Clarisse Gouge, MD  HPI: Patient is a 76 year old female who originally presented in September 2016 with excision of the hemorrhoid showing focal area of invasive squamous cell carcinoma. She went reexcision again showing dysplasia to surgical margin. We opted not to treat with adjuvant radiation and she is doing well she specifically denies any pain on defecation. She does have some loose stools all though she is on a mostly vegan diet. She has lost some weight over the past year which she attributes to her diet..  COMPLICATIONS OF TREATMENT: none  FOLLOW UP COMPLIANCE: keeps appointments   PHYSICAL EXAM:  BP 134/78   Pulse 92   Temp 98 F (36.7 C)   Resp 18   Ht 5\' 7"  (1.702 m)   Wt 166 lb 8.9 oz (75.5 kg)   BMI 26.09 kg/m  There is a slight external hemorrhoid present no evidence of mass or nodularity is noted in the anal region. Rectal exam is within normal limits no evidence of inguinal adenopathy is identified. Well-developed well-nourished patient in NAD. HEENT reveals PERLA, EOMI, discs not visualized.  Oral cavity is clear. No oral mucosal lesions are identified. Neck is clear without evidence of cervical or supraclavicular adenopathy. Lungs are clear to A&P. Cardiac examination is essentially unremarkable with regular rate and rhythm without murmur rub or thrill. Abdomen is benign with no organomegaly or masses noted. Motor sensory and DTR levels are equal and symmetric in the upper and lower extremities. Cranial nerves II through XII are grossly intact. Proprioception is intact. No peripheral adenopathy or edema is identified. No motor or sensory levels are noted. Crude visual fields are within normal  range.  RADIOLOGY RESULTS: No current films for review  PLAN: Present time I believe she is doing well with no evidence of disease. She continues close follow-up care with both surgeon and medical oncology. I have discontinue follow-up care. I would be happy to reevaluate the patient any time the future should radiation oncology opinion be indicated. Patient knows to call at anytime with concerns.  I would like to take this opportunity to thank you for allowing me to participate in the care of your patient.Armstead Peaks., MD

## 2017-04-06 ENCOUNTER — Observation Stay
Admission: EM | Admit: 2017-04-06 | Discharge: 2017-04-06 | Disposition: A | Discharge status: Home / Self Care | Payer: Medicare Other | Attending: Internal Medicine | Admitting: Internal Medicine

## 2017-04-06 ENCOUNTER — Encounter: Payer: Self-pay | Admitting: Emergency Medicine

## 2017-04-06 ENCOUNTER — Emergency Department: Payer: Medicare Other

## 2017-04-06 DIAGNOSIS — Z79899 Other long term (current) drug therapy: Secondary | ICD-10-CM | POA: Diagnosis not present

## 2017-04-06 DIAGNOSIS — Z8673 Personal history of transient ischemic attack (TIA), and cerebral infarction without residual deficits: Secondary | ICD-10-CM | POA: Diagnosis not present

## 2017-04-06 DIAGNOSIS — R079 Chest pain, unspecified: Secondary | ICD-10-CM | POA: Diagnosis not present

## 2017-04-06 DIAGNOSIS — I6523 Occlusion and stenosis of bilateral carotid arteries: Secondary | ICD-10-CM | POA: Insufficient documentation

## 2017-04-06 DIAGNOSIS — H3530 Unspecified macular degeneration: Secondary | ICD-10-CM | POA: Insufficient documentation

## 2017-04-06 DIAGNOSIS — Z85048 Personal history of other malignant neoplasm of rectum, rectosigmoid junction, and anus: Secondary | ICD-10-CM | POA: Diagnosis not present

## 2017-04-06 DIAGNOSIS — F419 Anxiety disorder, unspecified: Secondary | ICD-10-CM | POA: Insufficient documentation

## 2017-04-06 DIAGNOSIS — I200 Unstable angina: Secondary | ICD-10-CM

## 2017-04-06 DIAGNOSIS — J4530 Mild persistent asthma, uncomplicated: Secondary | ICD-10-CM | POA: Diagnosis not present

## 2017-04-06 DIAGNOSIS — E782 Mixed hyperlipidemia: Secondary | ICD-10-CM | POA: Diagnosis not present

## 2017-04-06 DIAGNOSIS — D869 Sarcoidosis, unspecified: Secondary | ICD-10-CM | POA: Insufficient documentation

## 2017-04-06 DIAGNOSIS — Z7982 Long term (current) use of aspirin: Secondary | ICD-10-CM | POA: Insufficient documentation

## 2017-04-06 DIAGNOSIS — H409 Unspecified glaucoma: Secondary | ICD-10-CM | POA: Insufficient documentation

## 2017-04-06 LAB — BASIC METABOLIC PANEL
ANION GAP: 6 (ref 5–15)
BUN: 22 mg/dL — AB (ref 6–20)
CO2: 32 mmol/L (ref 22–32)
Calcium: 9.2 mg/dL (ref 8.9–10.3)
Chloride: 102 mmol/L (ref 101–111)
Creatinine, Ser: 0.73 mg/dL (ref 0.44–1.00)
Glucose, Bld: 107 mg/dL — ABNORMAL HIGH (ref 65–99)
POTASSIUM: 3.5 mmol/L (ref 3.5–5.1)
SODIUM: 140 mmol/L (ref 135–145)

## 2017-04-06 LAB — CBC
HEMATOCRIT: 38.6 % (ref 35.0–47.0)
HEMOGLOBIN: 13.1 g/dL (ref 12.0–16.0)
MCH: 32.4 pg (ref 26.0–34.0)
MCHC: 33.9 g/dL (ref 32.0–36.0)
MCV: 95.4 fL (ref 80.0–100.0)
Platelets: 334 10*3/uL (ref 150–440)
RBC: 4.04 MIL/uL (ref 3.80–5.20)
RDW: 13.4 % (ref 11.5–14.5)
WBC: 5.5 10*3/uL (ref 3.6–11.0)

## 2017-04-06 LAB — TROPONIN I
Troponin I: <0.03 ng/mL (ref <0.03)
Troponin I: <0.03 ng/mL (ref <0.03)
Troponin I: <0.03 ng/mL (ref <0.03)

## 2017-04-06 MED ORDER — ONDANSETRON HCL 4 MG/2ML IJ SOLN: 4.0000 mg | Freq: Four times a day (QID) | INTRAMUSCULAR | Status: DC | PRN

## 2017-04-06 MED ORDER — CLOPIDOGREL BISULFATE 75 MG PO TABS
75.0000 mg | ORAL_TABLET | Freq: Every day | ORAL | Status: DC
  Administered 2017-04-06: 75 mg via ORAL
  Filled 2017-04-06: qty 1

## 2017-04-06 MED ORDER — ACETAMINOPHEN 325 MG PO TABS: 650.0000 mg | ORAL_TABLET | ORAL | Status: DC | PRN

## 2017-04-06 MED ORDER — BUDESONIDE 0.25 MG/2ML IN SUSP
0.2500 mg | Freq: Two times a day (BID) | RESPIRATORY_TRACT | Status: DC
  Administered 2017-04-06: 0.25 mg via RESPIRATORY_TRACT
  Filled 2017-04-06: qty 2

## 2017-04-06 MED ORDER — SODIUM CHLORIDE 0.9% FLUSH: 3.0000 mL | INTRAVENOUS | Status: DC | PRN

## 2017-04-06 MED ORDER — SUCRALFATE 1 GM/10ML PO SUSP
1.0000 g | Freq: Three times a day (TID) | ORAL | Status: DC
  Administered 2017-04-06: 1 g via ORAL
  Filled 2017-04-06 (×3): qty 10

## 2017-04-06 MED ORDER — SODIUM CHLORIDE 0.9 % IV SOLN: 250.0000 mL | INTRAVENOUS | Status: DC | PRN

## 2017-04-06 MED ORDER — SODIUM CHLORIDE 0.9% FLUSH
3.0000 mL | Freq: Two times a day (BID) | INTRAVENOUS | Status: DC
  Administered 2017-04-06: 3 mL via INTRAVENOUS

## 2017-04-06 MED ORDER — MORPHINE SULFATE (PF) 2 MG/ML IV SOLN
INTRAVENOUS | Status: AC
  Filled 2017-04-06: qty 1

## 2017-04-06 MED ORDER — MORPHINE SULFATE (PF) 2 MG/ML IV SOLN: 2.0000 mg | INTRAVENOUS | Status: DC | PRN

## 2017-04-06 MED ORDER — TRAMADOL HCL 50 MG PO TABS: 50.0000 mg | ORAL_TABLET | Freq: Four times a day (QID) | ORAL | Status: DC | PRN

## 2017-04-06 MED ORDER — NITROGLYCERIN 0.4 MG SL SUBL: 0.4000 mg | SUBLINGUAL_TABLET | SUBLINGUAL | Status: DC | PRN

## 2017-04-06 MED ORDER — ROSUVASTATIN CALCIUM 5 MG PO TABS
5.0000 mg | ORAL_TABLET | ORAL | Status: DC
  Administered 2017-04-06: 5 mg via ORAL
  Filled 2017-04-06: qty 1

## 2017-04-06 MED ORDER — BECLOMETHASONE DIPROPIONATE 80 MCG/ACT IN AERS
1.0000 | INHALATION_SPRAY | Freq: Two times a day (BID) | RESPIRATORY_TRACT | Status: DC
Start: 2017-04-06 — End: 2017-04-06

## 2017-04-06 MED ORDER — ALUM & MAG HYDROXIDE-SIMETH 200-200-20 MG/5ML PO SUSP
ORAL | Status: AC
  Administered 2017-04-06: 12:00:00
  Filled 2017-04-06: qty 30

## 2017-04-06 MED ORDER — ALUM & MAG HYDROXIDE-SIMETH 200-200-20 MG/5ML PO SUSP
30.0000 mL | Freq: Four times a day (QID) | ORAL | Status: DC | PRN
  Administered 2017-04-06: 30 mL via ORAL

## 2017-04-06 MED ORDER — OCUVITE-LUTEIN PO CAPS: 1.0000 | ORAL_CAPSULE | Freq: Every day | ORAL | Status: DC

## 2017-04-06 MED ORDER — PREDNISONE 10 MG PO TABS: 10.0000 mg | ORAL_TABLET | Freq: Every day | ORAL | Status: DC

## 2017-04-06 MED ORDER — ONDANSETRON HCL 4 MG/2ML IJ SOLN
INTRAMUSCULAR | Status: AC
  Filled 2017-04-06: qty 2

## 2017-04-06 MED ORDER — PANTOPRAZOLE SODIUM 40 MG PO TBEC
40.0000 mg | DELAYED_RELEASE_TABLET | Freq: Every day | ORAL | Status: DC
  Administered 2017-04-06: 40 mg via ORAL
  Filled 2017-04-06: qty 1

## 2017-04-06 MED ORDER — NITROGLYCERIN 2 % TD OINT
0.5000 [in_us] | TOPICAL_OINTMENT | Freq: Once | TRANSDERMAL | Status: AC
  Administered 2017-04-06: 0.5 [in_us] via TOPICAL
  Filled 2017-04-06: qty 1

## 2017-04-06 MED ORDER — ENOXAPARIN SODIUM 40 MG/0.4ML ~~LOC~~ SOLN: 40.0000 mg | SUBCUTANEOUS | Status: DC

## 2017-04-06 MED ORDER — SODIUM CHLORIDE 0.9 % IV BOLUS (SEPSIS)
1000.0000 mL | Freq: Once | INTRAVENOUS | Status: AC
  Administered 2017-04-06: 1000 mL via INTRAVENOUS

## 2017-04-06 MED ORDER — ALBUTEROL SULFATE HFA 108 (90 BASE) MCG/ACT IN AERS: 2.0000 | INHALATION_SPRAY | Freq: Four times a day (QID) | RESPIRATORY_TRACT | Status: DC | PRN

## 2017-04-06 MED ORDER — ALBUTEROL SULFATE (2.5 MG/3ML) 0.083% IN NEBU: 2.5000 mg | INHALATION_SOLUTION | Freq: Four times a day (QID) | RESPIRATORY_TRACT | Status: DC | PRN

## 2017-04-06 MED ORDER — VITAMIN D 1000 UNITS PO TABS
1000.0000 [IU] | ORAL_TABLET | Freq: Every day | ORAL | Status: DC
  Administered 2017-04-06: 1000 [IU] via ORAL
  Filled 2017-04-06: qty 1

## 2017-04-06 MED ORDER — BRIMONIDINE TARTRATE 0.15 % OP SOLN
2.0000 [drp] | Freq: Two times a day (BID) | OPHTHALMIC | Status: DC
  Administered 2017-04-06: 2 [drp] via OPHTHALMIC
  Filled 2017-04-06: qty 5

## 2017-04-06 MED ORDER — ONDANSETRON HCL 4 MG/2ML IJ SOLN
4.0000 mg | Freq: Once | INTRAMUSCULAR | Status: AC
  Administered 2017-04-06: 4 mg via INTRAVENOUS

## 2017-04-06 MED ORDER — ADULT MULTIVITAMIN W/MINERALS CH
1.0000 | ORAL_TABLET | Freq: Every day | ORAL | Status: DC
  Administered 2017-04-06: 1 via ORAL
  Filled 2017-04-06: qty 1

## 2017-04-06 MED ORDER — ZOLPIDEM TARTRATE 5 MG PO TABS: 5.0000 mg | ORAL_TABLET | Freq: Every evening | ORAL | Status: DC | PRN

## 2017-04-06 MED ORDER — MAGNESIUM OXIDE 400 (241.3 MG) MG PO TABS
400.0000 mg | ORAL_TABLET | ORAL | Status: DC
  Administered 2017-04-06: 400 mg via ORAL
  Filled 2017-04-06: qty 1

## 2017-04-06 MED ORDER — ASPIRIN EC 81 MG PO TBEC: 81.0000 mg | DELAYED_RELEASE_TABLET | Freq: Every day | ORAL | Status: DC

## 2017-04-06 NOTE — ED Provider Notes (Signed)
[facility] Emergency Department Provider Note   ____________________________________________   First MD Initiated Contact with Patient 04/06/17 [phone]     (approximate)  I have reviewed the triage vital signs and the nursing notes.   HISTORY  Chief Complaint Chest Pain    HPI Lisa [name] is a 76 y.o. female who presents to the ED from home with a chief complaint of chest pain. Patient has a history of sarcoidosis, not on steroids, heart murmur, hyperlipidemia who was awakened from sleep approximately 25 minutes prior to arrival with chest pain.Patient describes central chest pressure, nonradiating, associated with diaphoresis. Denies associated shortness of breath, nauseous as vomiting, palpitations or dizziness. Reports improvement in symptoms without intervention en route to the ED. Denies recent travel or trauma.   Past Medical History:  Diagnosis Date  . Arthritis   . Asthma   . Cancer (HCC)    anal  . Carotid stenosis   . Colon polyp 05/22/2013  . Foot mass   . Glaucoma (increased eye pressure)   . Headache   . Heart murmur   . Hypercholesterolemia   . Hyperlipidemia   . Irregular heartbeat   . Localized primary osteoarthritis of lower leg   . Macular degeneration   . Mitral valve prolapse   . Neck pain   . Osteopenia   . Sarcoidosis   . Sarcoidosis   . Shortness of breath dyspnea    with exertion  . TIA (transient ischemic attack)     Patient Active Problem List   Diagnosis Date Noted  . Asthma without status asthmaticus 09/25/2015  . Cancer of anal margin 08/20/2015  . Chest pain 07/23/2015  . Chronic rhinitis 06/04/2015  . Near syncope 09/12/2014  . Primary osteoarthritis of both knees 08/16/2014  . Muscle strain 05/17/2014  . Cephalalgia 04/10/2014  . LBP (low back pain) 04/10/2014  . Cervical pain 04/10/2014  . Decreased potassium in the blood 03/25/2014  . Arthritis of knee, degenerative 01/25/2014  . Borderline  glaucoma, 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/04/2013  . Foot mass 06/05/2013  . Arthritis of foot, degenerative 06/05/2013  . Carotid artery narrowing 03/30/2013  . Cardiac murmur 03/07/2013  . Persons encountering health services in other specified circumstances 10/26/2012  . Airway hyperreactivity 08/25/2012  . Hypercholesterolemia 08/25/2012  . Degeneration macular 08/25/2012  . Carotid stenosis, bilateral 07/21/2012  . Idiopathic localized osteoarthropathy 02/17/2012  . Asthma, mild persistent 02/14/2012  . URI (upper respiratory infection) 11/15/2011  . ALLERGY, FOOD 05/20/2010  . PULMONARY NODULE, LEFT UPPER LOBE 08/06/2008  . Disease of lung 08/06/2008  . Respiratory insufficiency 08/06/2008  . Sarcoidosis with airway involvement  08/22/2007    Past Surgical History:  Procedure Laterality Date  . ABDOMINAL HYSTERECTOMY    . cataract surgery  08/18/09   left eye  . COLONOSCOPY  05/22/2013  . COLONOSCOPY WITH PROPOFOL N/A 09/15/2015   Procedure: COLONOSCOPY WITH PROPOFOL;  Surgeon: Hulen Luster, MD;  Location: [facility] ENDOSCOPY;  Service: Gastroenterology;  Laterality: N/A;  . DILATION AND CURETTAGE OF UTERUS    . EYE SURGERY Bilateral    cataract extraction  . HEMORRHOID SURGERY N/A 07/24/2015   Procedure: HEMORRHOIDECTOMY/INTERNAL AND EXTERNAL;  Surgeon: Leonie Green, MD;  Location: [facility] ORS;  Service: General;  Laterality: N/A;  . hysterectomy with ovaries intact    . MASS EXCISION N/A 08/21/2015   Procedure: EXCISION MASS/ ANAL MASS;  Surgeon: Leonie Green, MD;  Location: [facility] ORS;  Service: General;  Laterality: N/A;  . TONSILLECTOMY    . TUMOR EXCISION N/A 09/16/2015   Procedure: TUMOR EXCISION RECTAL;  Surgeon: Leonie Green, MD;  Location: [facility] ORS;  Service: General;  Laterality: N/A;    Prior to Admission medications   Medication Sig Start Date End Date Taking? Authorizing Provider  albuterol (PROAIR HFA) 108 (90 Base) MCG/ACT inhaler Inhale 2  puffs into the lungs every 6 (six) hours as needed for wheezing or shortness of breath. 06/10/16   Tanda Rockers, MD  ALPHAGAN P 0.15 % ophthalmic solution Place 2 drops into the left eye 2 (two) times daily.  06/23/12   [provider]  aspirin EC 81 MG tablet Take 81 mg by mouth daily.    [provider]  beclomethasone (QVAR) 80 MCG/ACT inhaler USE 2 INHALATIONS ORALLY   TWICE DAILY 06/04/16   Tanda Rockers, MD  cholecalciferol (VITAMIN D) 1000 UNITS tablet Take 1,000 Units by mouth daily.     [provider]  furosemide (LASIX) 20 MG tablet Take by mouth. 08/23/16 07/28/17  [provider]  lidocaine (LIDODERM) 5 % Place 1 patch onto the skin daily as needed. Remove & Discard patch within 12 hours or as directed by MD    [provider]  Lidocaine 0.5 % GEL Apply 1 application topically 3 (three) times daily as needed. 01/07/16   Menshew, Dannielle Karvonen, PA-C  loperamide (IMODIUM) 2 MG capsule Take by mouth as needed for diarrhea or loose stools.    [provider]  Magnesium 400 MG TABS Take 1 tablet by mouth every morning.     [provider]  Multiple Vitamin (MULTIVITAMIN WITH MINERALS) TABS Take 1 tablet by mouth daily. Centrum Silver    [provider]  Multiple Vitamins-Minerals (PRESERVISION AREDS) CAPS Take 1 capsule by mouth daily.    [provider]  omeprazole (PRILOSEC) 20 MG capsule Take 1 capsule by mouth 2 (two) times daily. 07/23/15   [provider]  pirbuterol (MAXAIR AUTOHALER) 200 MCG/INH inhaler Inhale 2 puffs into the lungs as needed. 06/04/16   Tanda Rockers, MD  potassium chloride (K-DUR) 10 MEQ tablet Take 20 mEq by mouth 2 (two) times daily.    [provider]  predniSONE (DELTASONE) 10 MG tablet Take 1 tablet (10 mg total) by mouth daily. 6,5,4,3,2,1 six day taper 07/10/16   Duanne Guess, PA-C  QVAR 80 MCG/ACT inhaler USE 2 INHALATIONS ORALLY   TWICE DAILY 06/30/16    Tanda Rockers, MD  rosuvastatin (CRESTOR) 5 MG tablet Take 5 mg by mouth every morning.     [provider]  sucralfate (CARAFATE) 1 GM/10ML suspension Take 1 g by mouth 4 (four) times daily -  with meals and at bedtime.    [provider]  traMADol (ULTRAM) 50 MG tablet Take 1 tablet (50 mg total) by mouth 3 (three) times daily as needed. 01/07/16   Menshew, Dannielle Karvonen, PA-C  triamcinolone ointment (KENALOG) 0.5 % Apply 1 application topically 2 (two) times daily. 01/07/16   Menshew, Dannielle Karvonen, PA-C  zolpidem (AMBIEN) 5 MG tablet Take 5 mg by mouth at bedtime as needed for sleep.    [provider]    Allergies Aspirin; Dairy aid [lactase]; Diazepam; and Shellfish-derived products  No family history on file.  Social History Social History  Substance Use Topics  . Smoking status: Never Smoker  . Smokeless tobacco: Never Used  . Alcohol use No  Review of Systems  Constitutional: No fever/chills. Eyes: No visual changes. ENT: No sore throat. Cardiovascular: Positive for chest pain. Respiratory: Denies shortness of breath. Gastrointestinal: No abdominal pain.  No nausea, no vomiting.  No diarrhea.  No constipation. Genitourinary: Negative for dysuria. Musculoskeletal: Negative for back pain. Skin: Negative for rash. Neurological: Negative for headaches, focal weakness or numbness.   ____________________________________________   PHYSICAL EXAM:  VITAL SIGNS: ED Triage Vitals  Enc Vitals Group     BP --      Pulse --      Resp --      Temp --      Temp src --      SpO2 --      Weight 04/06/17 0530 168 lb (76.2 kg)     Height 04/06/17 0530 5\' 7"  (1.702 m)     Head Circumference --      Peak Flow --      Pain Score 04/06/17 0529 6     Pain Loc --      Pain Edu? --      Excl. in [place]? --     Constitutional: Alert and oriented. Well appearing and in no acute distress. Eyes: Conjunctivae are normal. PERRL. EOMI. Head:  Atraumatic. Nose: No congestion/rhinnorhea. Mouth/Throat: Mucous membranes are moist.  Oropharynx non-erythematous. Neck: No stridor.   Cardiovascular: Normal rate, regular rhythm. II/VI SEM.  Good peripheral circulation. Respiratory: Normal respiratory effort.  No retractions. Lungs CTAB. Gastrointestinal: Soft and nontender. No distention. No abdominal bruits. No CVA tenderness. Musculoskeletal: No lower extremity tenderness nor edema.  No joint effusions. Neurologic:  Normal speech and language. No gross focal neurologic deficits are appreciated. No gait instability. Skin:  Skin is warm, dry and intact. No rash noted. Psychiatric: Mood and affect are normal. Speech and behavior are normal.  ____________________________________________   LABS (all labs ordered are listed, but only abnormal results are displayed)  Labs Reviewed  BASIC METABOLIC PANEL - Abnormal; Notable for the following:       Result Value   Glucose, Bld 107 (*)    BUN 22 (*)    All other components within normal limits  CBC  TROPONIN I   ____________________________________________  EKG  ED ECG REPORT I, SUNG,JADE J, the attending physician, personally viewed and interpreted this ECG.   Date: 04/06/2017  EKG Time: 0542  Rate: 67  Rhythm: normal EKG, normal sinus rhythm  Axis: Normal  Intervals:none  ST&T Change: Nonspecific  ____________________________________________  RADIOLOGY  Dg Chest Portable 1 View  Result Date: 04/06/2017 CLINICAL DATA:  Acute onset of mid chest pain.  Initial encounter. EXAM: PORTABLE CHEST 1 VIEW COMPARISON:  Chest radiograph performed 06/04/2016 FINDINGS: The lungs are well-aerated and clear. There is no evidence of focal opacification, pleural effusion or pneumothorax. Mild scarring is noted near the lung apices. The cardiomediastinal silhouette is enlarged. No acute osseous abnormalities are seen. IMPRESSION: Mild scarring near the lung apices. Lungs otherwise grossly  clear. Cardiomegaly noted. Electronically Signed   By: Garald Balding M.D.   On: 04/06/2017 06:04    ____________________________________________   PROCEDURES  Procedure(s) performed: None  Procedures  Critical Care performed: No  ____________________________________________   INITIAL IMPRESSION / ASSESSMENT AND PLAN / ED COURSE  Pertinent labs & imaging results that were available during my care of the patient were reviewed by me and considered in my medical decision making (see chart for details).  76 year old female who presents with chest pain concerning for unstable angina. Initial  EKG and troponin are unremarkable. Will administer nitroglycerin paste; patient is allergic to aspirin. Discussed with hospitalist to evaluate patient in the emergency for admission.  Clinical Course as of Apr 07 647  Wed Apr 06, 2017  0628 Prior to nitroglycerin administration, patient complained of feeling generally weak and nauseated. Blood pressure 122/62, pulse rate 61, room air saturations 98%. IV fluids and Zofran administered.  [JS]  [id number] Feeling better after Zofran administration. Blood pressure 117/63, heart rate 60.  [JS]    Clinical Course User Index [JS] Paulette Blanch, MD     ____________________________________________   FINAL CLINICAL IMPRESSION(S) / ED DIAGNOSES  Final diagnoses:  Chest pain, unspecified type  Unstable angina (HCC)      NEW MEDICATIONS STARTED DURING THIS VISIT:  New Prescriptions   No medications on file     Note:  This document was prepared using Dragon voice recognition software and may include unintentional dictation errors.    Paulette Blanch, MD 04/06/17 [phone]

## 2017-04-06 NOTE — ED Notes (Signed)
Called to check status of bed.  Told bed will be approved shortly.

## 2017-04-06 NOTE — Consult Note (Signed)
[facility] Consultation Note  Patient ID: Lisa [name], MRN: [id number], DOB/AGE: [birth year] 76 y.o. Admit date: 04/06/2017   Date of Consult: 04/06/2017 Primary Physician: Lisa Gouge, MD Primary Cardiologist:Lisa [name]  Chief Complaint:  Chief Complaint  Patient presents with  . Chest Pain   Reason for Consult: chest pain  HPI: 76 y.o. female with the known carotid atherosclerosis to a minimal degree hyperlipidemia sarcoidosis and some asthma for which the patient has had appropriate treatment. She has been on antiplatelet medication management as well as the lipid management as well with Crestor. Recently she has had significant upper epigastric and right sided chest discomfort radiating into her right breast with and without some shortness of breath waxing and waning over the last 24 hours. The patient does feel much better at this time but does have some more chest pain when she has a deep breath. This appears to be more pleuritic than true anginal. EKG shows normal sinus rhythm and troponin levels are normal. With ambulation she has no further significant symptoms. This appears to be musculoskeletal sarcoid or other issues  Past Medical History:  Diagnosis Date  . Arthritis   . Asthma   . Cancer (HCC)    anal  . Carotid stenosis   . Colon polyp 05/22/2013  . Foot mass   . Glaucoma (increased eye pressure)   . Headache   . Heart murmur   . Hypercholesterolemia   . Hyperlipidemia   . Irregular heartbeat   . Localized primary osteoarthritis of lower leg   . Macular degeneration   . Mitral valve prolapse   . Neck pain   . Osteopenia   . Sarcoidosis   . Sarcoidosis   . Shortness of breath dyspnea    with exertion  . TIA (transient ischemic attack)       Surgical History:  Past Surgical History:  Procedure Laterality Date  . ABDOMINAL HYSTERECTOMY    . cataract surgery  08/18/09   left eye  . COLONOSCOPY  05/22/2013  . COLONOSCOPY WITH PROPOFOL N/A  09/15/2015   Procedure: COLONOSCOPY WITH PROPOFOL;  Surgeon: Lisa Luster, MD;  Location: [facility] ENDOSCOPY;  Service: Gastroenterology;  Laterality: N/A;  . DILATION AND CURETTAGE OF UTERUS    . EYE SURGERY Bilateral    cataract extraction  . HEMORRHOID SURGERY N/A 07/24/2015   Procedure: HEMORRHOIDECTOMY/INTERNAL AND EXTERNAL;  Surgeon: Lisa Green, MD;  Location: [facility] ORS;  Service: General;  Laterality: N/A;  . hysterectomy with ovaries intact    . MASS EXCISION N/A 08/21/2015   Procedure: EXCISION MASS/ ANAL MASS;  Surgeon: Lisa Green, MD;  Location: [facility] ORS;  Service: General;  Laterality: N/A;  . TONSILLECTOMY    . TUMOR EXCISION N/A 09/16/2015   Procedure: TUMOR EXCISION RECTAL;  Surgeon: Lisa Green, MD;  Location: [facility] ORS;  Service: General;  Laterality: N/A;     Home Meds: Prior to Admission medications   Medication Sig Start Date End Date Taking? Authorizing Provider  albuterol (PROAIR HFA) 108 (90 Base) MCG/ACT inhaler Inhale 2 puffs into the lungs every 6 (six) hours as needed for wheezing or shortness of breath. 06/10/16  Yes Lisa Rockers, MD  ALPHAGAN P 0.15 % ophthalmic solution Place 2 drops into the left eye 2 (two) times daily.  06/23/12  Yes [provider]  aspirin EC 81 MG tablet Take 81 mg by mouth daily.   Yes [provider]  beclomethasone (QVAR) 80 MCG/ACT inhaler USE  2 INHALATIONS ORALLY   TWICE DAILY 06/04/16  Yes Lisa Rockers, MD  cholecalciferol (VITAMIN D) 1000 UNITS tablet Take 1,000 Units by mouth daily.    Yes [provider]  furosemide (LASIX) 20 MG tablet Take 20 mg by mouth daily.  08/23/16 07/28/17 Yes [provider]  lidocaine (LIDODERM) 5 % Place 1 patch onto the skin daily as needed. Remove & Discard patch within 12 hours or as directed by MD   Yes [provider]  loperamide (IMODIUM) 2 MG capsule Take by mouth as needed for diarrhea or loose stools.   Yes [provider]   Magnesium 400 MG TABS Take 1 tablet by mouth every morning.    Yes [provider]  Multiple Vitamin (MULTIVITAMIN WITH MINERALS) TABS Take 1 tablet by mouth daily. Centrum Silver   Yes [provider]  omeprazole (PRILOSEC) 20 MG capsule Take 2 capsules by mouth 2 (two) times daily.  07/23/15  Yes [provider]  potassium chloride (K-DUR) 10 MEQ tablet Take 20 mEq by mouth 2 (two) times daily.   Yes [provider]  rosuvastatin (CRESTOR) 5 MG tablet Take 5 mg by mouth every morning.    Yes [provider]  sucralfate (CARAFATE) 1 GM/10ML suspension Take 1 g by mouth 4 (four) times daily -  with meals and at bedtime.   Yes [provider]  zolpidem (AMBIEN) 5 MG tablet Take 5 mg by mouth at bedtime as needed for sleep.   Yes [provider]    Inpatient Medications:  . [START ON 04/07/2017] aspirin EC  81 mg Oral Daily  . brimonidine  2 drop Left Eye BID  . budesonide (PULMICORT) nebulizer solution  0.25 mg Nebulization BID  . cholecalciferol  1,000 Units Oral Daily  . clopidogrel  75 mg Oral Daily  . enoxaparin (LOVENOX) injection  40 mg Subcutaneous Q24H  . magnesium oxide  400 mg Oral BH-q7a  . multivitamin with minerals  1 tablet Oral Daily  . multivitamin-lutein  1 capsule Oral Daily  . pantoprazole  40 mg Oral Daily  . rosuvastatin  5 mg Oral BH-q7a  . sodium chloride flush  3 mL Intravenous Q12H  . sucralfate  1 g Oral TID WC & HS   . sodium chloride      Allergies:  Allergies  Allergen Reactions  . Aspirin Other (See Comments)    Pt can take low dose aspirin with no reaction  . Dairy Aid [Lactase] Other (See Comments)  . Diazepam Other (See Comments)    Affected her hearing and sight  . Shellfish-Derived Products Swelling    Social History   Social History  . Marital status: Widowed    Spouse name: N/A  . Number of children: 1  . Years of education: N/A   Occupational History  . retired from  [employer]  . Smoking status: Never Smoker  . Smokeless tobacco: Never Used  . Alcohol use No  . Drug use: No  . Sexual activity: Not on file   Other Topics Concern  . Not on file   Social History Narrative  . No narrative on file     History reviewed. No pertinent family history.   Review of Systems Positive forChest discomfort Negative for: General:  chills, fever, night sweats or weight changes.  Cardiovascular: PND orthopnea syncope dizziness  Dermatological skin lesions rashes Respiratory: Cough congestion Urologic: Frequent urination urination at night and  hematuria Abdominal: negative for nausea, vomiting, diarrhea, bright red blood per rectum, melena, or hematemesis Neurologic: negative for visual changes, and/or hearing changes  All other systems reviewed and are otherwise negative except as noted above.  Labs:  Recent Labs  04/06/17 0533 04/06/17 0927  TROPONINI <0.03 <0.03   Lab Results  Component Value Date   WBC 5.5 04/06/2017   HGB 13.1 04/06/2017   HCT 38.6 04/06/2017   MCV 95.4 04/06/2017   PLT 334 04/06/2017    Recent Labs Lab 04/06/17 0533  NA 140  K 3.5  CL 102  CO2 32  BUN 22*  CREATININE 0.73  CALCIUM 9.2  GLUCOSE 107*   No results found for: CHOL, HDL, LDLCALC, TRIG Lab Results  Component Value Date   DDIMER 0.23 11/14/2011    Radiology/Studies:  Dg Chest Portable 1 View  Result Date: 04/06/2017 CLINICAL DATA:  Acute onset of mid chest pain.  Initial encounter. EXAM: PORTABLE CHEST 1 VIEW COMPARISON:  Chest radiograph performed 06/04/2016 FINDINGS: The lungs are well-aerated and clear. There is no evidence of focal opacification, pleural effusion or pneumothorax. Mild scarring is noted near the lung apices. The cardiomediastinal silhouette is enlarged. No acute osseous abnormalities are seen. IMPRESSION: Mild scarring near the lung apices. Lungs otherwise grossly clear. Cardiomegaly noted.  Electronically Signed   By: Garald Balding M.D.   On: 04/06/2017 06:04    ION:[id number] sinus rhythm otherwise normal EKG  Weights: Filed Weights   04/06/17 0530  Weight: 76.2 kg (168 lb)     Physical Exam: Blood pressure 137/65, pulse 66, temperature 97.7 F (36.5 C), temperature source Oral, resp. rate 18, height 5\' 7"  (1.702 m), weight 76.2 kg (168 lb), SpO2 94 %. Body mass index is 26.31 kg/m. General: Well developed, well nourished, in no acute distress. Head eyes ears nose throat: Normocephalic, atraumatic, sclera non-icteric, no xanthomas, nares are without discharge. No apparent thyromegaly and/or mass  Lungs: Normal respiratory effort.  no wheezes, no rales, no rhonchi.  Heart: RRR with normal S1 Soft S2. 3+ aortic murmur gallop, no rub, PMI is normal size and placement, carotid upstroke normal with bruit, jugular venous pressure is normal Abdomen: Soft, non-tender, non-distended with normoactive bowel sounds. No hepatomegaly. No rebound/guarding. No obvious abdominal masses. Abdominal aorta is normal size without bruit Extremities: No edema. no cyanosis, no clubbing, no ulcers  Peripheral : 2+ bilateral upper extremity pulses, 2+ bilateral femoral pulses, 2+ bilateral dorsal pedal pulse Neuro: Alert and oriented. No facial asymmetry. No focal deficit. Moves all extremities spontaneously. Musculoskeletal: Normal muscle tone without kyphosis Psych:  Responds to questions appropriately with a normal affect.    Assessment: 76 year old female with mixed hyperlipidemia carotid atherosclerosis sarcoidosis and valvular heart disease currently with atypical chest discomfort on the right side pleuritic in nature without evidence of myocardial infarction or true angina  Plan: 1. No further cardiac diagnostics and her treatment at this time due to atypical chest pain without evidence of myocardial infarction 2. Ambulation and follow for further significant symptoms 3. Okay for discharge  home from cardiac standpoint with follow-up in office with possible stress test or further intervention of valvular heart disease 4. Further workup and treatment of sarcoidosis rheumatoid  Signed, Corey Skains M.D. [facility] 04/06/2017, 1:08 PM

## 2017-04-06 NOTE — ED Triage Notes (Addendum)
Patient ambulatory to triage with steady gait, without difficulty or distress noted; pt reports mid CP, nonradiating upon awakening 3min PTA; ; denies accomp symptoms; denies hx of same; pt taken immed to room 12 via w/c by Butch, RN for EKG and further eval

## 2017-04-06 NOTE — ED Notes (Signed)
Attempted to call report.  Told nurse not in a position to take report at this time.  Gave number to be called back.

## 2017-04-06 NOTE — Progress Notes (Signed)
Discharge instruction reviewed with patient. Patient verbalized understanding. IV removed, pressure dressing applied. Patient tolerated removal. No adverse reaction to medications or treatment.

## 2017-04-06 NOTE — Discharge Summary (Signed)
[facility] [place] NAME: Lisa [name]    MR#:  [id number]  DATE OF BIRTH:  31-Dec-1940  DATE OF ADMISSION:  04/06/2017 ADMITTING PHYSICIAN: Saundra Shelling, MD  DATE OF DISCHARGE: 04/06/2017  PRIMARY CARE PHYSICIAN: Clarisse Gouge, MD    ADMISSION DIAGNOSIS:  Unstable angina ([place]) [I20.0] Chest pain [R07.9] Chest pain, unspecified type [R07.9]  DISCHARGE DIAGNOSIS:  Active Problems:   Chest pain   SECONDARY DIAGNOSIS:   Past Medical History:  Diagnosis Date  . Arthritis   . Asthma   . Cancer (HCC)    anal  . Carotid stenosis   . Colon polyp 05/22/2013  . Foot mass   . Glaucoma (increased eye pressure)   . Headache   . Heart murmur   . Hypercholesterolemia   . Hyperlipidemia   . Irregular heartbeat   . Localized primary osteoarthritis of lower leg   . Macular degeneration   . Mitral valve prolapse   . Neck pain   . Osteopenia   . Sarcoidosis   . Sarcoidosis   . Shortness of breath dyspnea    with exertion  . TIA (transient ischemic attack)     HOSPITAL COURSE:   1. Chest pain. Patient had 3 sets of cardiac enzymes are negative. Since the patient came earlier in the day they could not do a stress test until after 3 enzymes were negative. Stress test will have to be done as outpatient. Patient is feeling well at this point. Patient does have some anxiety with taking care of her daughter. 2. History of sarcoidosis continue inhalers 3. Hyperlipidemia unspecified on Crestor 4. Glaucoma unspecified continue usual medications  DISCHARGE CONDITIONS:   Satisfactory  CONSULTS OBTAINED:   cardiology  DRUG ALLERGIES:   Allergies  Allergen Reactions  . Aspirin Other (See Comments)    Pt can take low dose aspirin with no reaction  . Dairy Aid [Lactase] Other (See Comments)  . Diazepam Other (See Comments)    Affected her hearing and sight  . Shellfish-Derived Products Swelling    DISCHARGE MEDICATIONS:   Current  Discharge Medication List    CONTINUE these medications which have NOT CHANGED   Details  albuterol (PROAIR HFA) 108 (90 Base) MCG/ACT inhaler Inhale 2 puffs into the lungs every 6 (six) hours as needed for wheezing or shortness of breath. Qty: 3 Inhaler, Refills: 1    ALPHAGAN P 0.15 % ophthalmic solution Place 2 drops into the left eye 2 (two) times daily.     aspirin EC 81 MG tablet Take 81 mg by mouth daily.    beclomethasone (QVAR) 80 MCG/ACT inhaler USE 2 INHALATIONS ORALLY   TWICE DAILY Qty: 3 Inhaler, Refills: 3    cholecalciferol (VITAMIN D) 1000 UNITS tablet Take 1,000 Units by mouth daily.     furosemide (LASIX) 20 MG tablet Take 20 mg by mouth daily.     lidocaine (LIDODERM) 5 % Place 1 patch onto the skin daily as needed. Remove & Discard patch within 12 hours or as directed by MD    loperamide (IMODIUM) 2 MG capsule Take by mouth as needed for diarrhea or loose stools.    Magnesium 400 MG TABS Take 1 tablet by mouth every morning.     Multiple Vitamin (MULTIVITAMIN WITH MINERALS) TABS Take 1 tablet by mouth daily. Centrum Silver    omeprazole (PRILOSEC) 20 MG capsule Take 2 capsules by mouth 2 (two) times daily.  Refills: 11  potassium chloride (K-DUR) 10 MEQ tablet Take 20 mEq by mouth 2 (two) times daily.    rosuvastatin (CRESTOR) 5 MG tablet Take 5 mg by mouth every morning.     sucralfate (CARAFATE) 1 GM/10ML suspension Take 1 g by mouth 4 (four) times daily -  with meals and at bedtime.    zolpidem (AMBIEN) 5 MG tablet Take 5 mg by mouth at bedtime as needed for sleep.      STOP taking these medications     Multiple Vitamins-Minerals (PRESERVISION AREDS) CAPS      traMADol (ULTRAM) 50 MG tablet          DISCHARGE INSTRUCTIONS:   Follow-up PMD one week Follow-up cardiology 1 week for stress test  If you experience worsening of your admission symptoms, develop shortness of breath, life threatening emergency, suicidal or homicidal thoughts you  must seek medical attention immediately by calling 911 or calling your MD immediately  if symptoms less severe.  You Must read complete instructions/literature along with all the possible adverse reactions/side effects for all the Medicines you take and that have been prescribed to you. Take any new Medicines after you have completely understood and accept all the possible adverse reactions/side effects.   Please note  You were cared for by a hospitalist during your hospital stay. If you have any questions about your discharge medications or the care you received while you were in the hospital after you are discharged, you can call the unit and asked to speak with the hospitalist on call if the hospitalist that took care of you is not available. Once you are discharged, your primary care physician will handle any further medical issues. Please note that NO REFILLS for any discharge medications will be authorized once you are discharged, as it is imperative that you return to your primary care physician (or establish a relationship with a primary care physician if you do not have one) for your aftercare needs so that they can reassess your need for medications and monitor your lab values.    Today   CHIEF COMPLAINT:   Chief Complaint  Patient presents with  . Chest Pain    HISTORY OF PRESENT ILLNESS:  Lisa [name]  is a 76 y.o. female presented with chest pain   VITAL SIGNS:  Blood pressure 137/65, pulse 66, temperature 97.7 F (36.5 C), temperature source Oral, resp. rate 18, height 5\' 7"  (1.702 m), weight 76.2 kg (168 lb), SpO2 94 %.    PHYSICAL EXAMINATION:  GENERAL:  76 y.o.-year-old patient lying in the bed with no acute distress.  EYES: Pupils equal, round, reactive to light and accommodation. No scleral icterus. Extraocular muscles intact.  HEENT: Head atraumatic, normocephalic. Oropharynx and nasopharynx clear.  NECK:  Supple, no jugular venous distention. No thyroid  enlargement, no tenderness.  LUNGS: Normal breath sounds bilaterally, no wheezing, rales,rhonchi or crepitation. No use of accessory muscles of respiration.  CARDIOVASCULAR: S1, S2 normal. No murmurs, rubs, or gallops.  ABDOMEN: Soft, non-tender, non-distended. Bowel sounds present. No organomegaly or mass.  EXTREMITIES: No pedal edema, cyanosis, or clubbing.  NEUROLOGIC: Cranial nerves II through XII are intact. Muscle strength 5/5 in all extremities. Sensation intact. Gait not checked.  PSYCHIATRIC: The patient is alert and oriented x 3.  SKIN: No obvious rash, lesion, or ulcer.   DATA REVIEW:   CBC  Recent Labs Lab 04/06/17 0533  WBC 5.5  HGB 13.1  HCT 38.6  PLT 334    Chemistries   Recent  Labs Lab 04/06/17 0533  NA 140  K 3.5  CL 102  CO2 32  GLUCOSE 107*  BUN 22*  CREATININE 0.73  CALCIUM 9.2    Cardiac Enzymes  Recent Labs Lab 04/06/17 1446  TROPONINI <0.03     RADIOLOGY:  Dg Chest Portable 1 View  Result Date: 04/06/2017 CLINICAL DATA:  Acute onset of mid chest pain.  Initial encounter. EXAM: PORTABLE CHEST 1 VIEW COMPARISON:  Chest radiograph performed 06/04/2016 FINDINGS: The lungs are well-aerated and clear. There is no evidence of focal opacification, pleural effusion or pneumothorax. Mild scarring is noted near the lung apices. The cardiomediastinal silhouette is enlarged. No acute osseous abnormalities are seen. IMPRESSION: Mild scarring near the lung apices. Lungs otherwise grossly clear. Cardiomegaly noted. Electronically Signed   By: Garald Balding M.D.   On: 04/06/2017 06:04      Management plans discussed with the patient, family and they are in agreement.  CODE STATUS:     Code Status Orders        Start     Ordered   04/06/17 0955  Full code  Continuous     04/06/17 0954    Code Status History    Date Active Date Inactive Code Status Order ID Comments User Context   This patient has a current code status but no historical code  status.    Advance Directive Documentation     Most Recent Value  Type of Advance Directive  Living will  Pre-existing out of facility DNR order (yellow form or pink MOST form)  -  "MOST" Form in Place?  -      TOTAL TIME TAKING CARE OF THIS PATIENT: 35 minutes.    Loletha Grayer M.D on 04/06/2017 at 4:21 PM  Between 7am to 6pm - Pager - [phone]  After 6pm go to [email or web address] - password [email or web address]  [facility] Office  [phone]  CC: Primary care physician; Clarisse Gouge, MD

## 2017-04-06 NOTE — Care Management Obs Status (Signed)
[place] NOTIFICATION   Patient Details  Name: TEHILLA COFFEL MRN: [id number] Date of Birth: 12-30-1940   Medicare Observation Status Notification Given:  No <24 hours   Katrina Stack, RN 04/06/2017, 4:01 PM

## 2017-04-06 NOTE — ED Notes (Signed)
MD Beather Arbour reviewed first EKG that was completed at 536am and requested another EKG be completed. Second EKG was completed at 542am. Rebecca,RN notified about this as well.

## 2017-04-06 NOTE — H&P (Signed)
[facility] at [place] NAME: Lisa [name]    MR#:  [id number]  DATE OF BIRTH:  1940-12-20  DATE OF ADMISSION:  04/06/2017  PRIMARY CARE PHYSICIAN: Clarisse Gouge, MD   REQUESTING/REFERRING PHYSICIAN:   CHIEF COMPLAINT:   Chief Complaint  Patient presents with  . Chest Pain    HISTORY OF PRESENT ILLNESS: Lisa [name]  is a 76 y.o. female with a known history of bronchial asthma, and anal cancer, arthritis, carotid stenosis, hyperlipidemia, glaucoma presented to the emergency room with chest pain. Pain is located in the left side of her chest started last night and was sharp in nature. The pain is 6 out of 10 on a scale of 1-10 and non radiating. Patient was worked up with troponin which was negative EKG normal sinus rhythm with no ST segment changes. Hospitalist service was consulted for further care of the patient. No complaints of any shortness of breath, orthopnea. No history of any cough.  PAST MEDICAL HISTORY:   Past Medical History:  Diagnosis Date  . Arthritis   . Asthma   . Cancer (HCC)    anal  . Carotid stenosis   . Colon polyp 05/22/2013  . Foot mass   . Glaucoma (increased eye pressure)   . Headache   . Heart murmur   . Hypercholesterolemia   . Hyperlipidemia   . Irregular heartbeat   . Localized primary osteoarthritis of lower leg   . Macular degeneration   . Mitral valve prolapse   . Neck pain   . Osteopenia   . Sarcoidosis   . Sarcoidosis   . Shortness of breath dyspnea    with exertion  . TIA (transient ischemic attack)     PAST SURGICAL HISTORY: Past Surgical History:  Procedure Laterality Date  . ABDOMINAL HYSTERECTOMY    . cataract surgery  08/18/09   left eye  . COLONOSCOPY  05/22/2013  . COLONOSCOPY WITH PROPOFOL N/A 09/15/2015   Procedure: COLONOSCOPY WITH PROPOFOL;  Surgeon: Hulen Luster, MD;  Location: [facility] ENDOSCOPY;  Service: Gastroenterology;  Laterality: N/A;  . DILATION AND CURETTAGE OF  UTERUS    . EYE SURGERY Bilateral    cataract extraction  . HEMORRHOID SURGERY N/A 07/24/2015   Procedure: HEMORRHOIDECTOMY/INTERNAL AND EXTERNAL;  Surgeon: Leonie Green, MD;  Location: [facility] ORS;  Service: General;  Laterality: N/A;  . hysterectomy with ovaries intact    . MASS EXCISION N/A 08/21/2015   Procedure: EXCISION MASS/ ANAL MASS;  Surgeon: Leonie Green, MD;  Location: [facility] ORS;  Service: General;  Laterality: N/A;  . TONSILLECTOMY    . TUMOR EXCISION N/A 09/16/2015   Procedure: TUMOR EXCISION RECTAL;  Surgeon: Leonie Green, MD;  Location: [facility] ORS;  Service: General;  Laterality: N/A;    SOCIAL HISTORY:  Social History  Substance Use Topics  . Smoking status: Never Smoker  . Smokeless tobacco: Never Used  . Alcohol use No    FAMILY HISTORY: No family history on file.  DRUG ALLERGIES:  Allergies  Allergen Reactions  . Aspirin Other (See Comments)    Pt can take low dose aspirin with no reaction  . Dairy Aid [Lactase] Other (See Comments)  . Diazepam Other (See Comments)    Affected her hearing and sight  . Shellfish-Derived Products Swelling    REVIEW OF SYSTEMS:   CONSTITUTIONAL: No fever, fatigue or weakness.  EYES: No blurred or double vision.  EARS, NOSE, AND THROAT: No  tinnitus or ear pain.  RESPIRATORY: No cough, shortness of breath, wheezing or hemoptysis.  CARDIOVASCULAR: Has chest pain, no orthopnea, edema.  GASTROINTESTINAL: No nausea, vomiting, diarrhea or abdominal pain.  GENITOURINARY: No dysuria, hematuria.  ENDOCRINE: No polyuria, nocturia,  HEMATOLOGY: No anemia, easy bruising or bleeding SKIN: No rash or lesion. MUSCULOSKELETAL: No joint pain or arthritis.   NEUROLOGIC: No tingling, numbness, weakness.  PSYCHIATRY: No anxiety or depression.   MEDICATIONS AT HOME:  Prior to Admission medications   Medication Sig Start Date End Date Taking? Authorizing Provider  albuterol (PROAIR HFA) 108 (90 Base) MCG/ACT inhaler Inhale  2 puffs into the lungs every 6 (six) hours as needed for wheezing or shortness of breath. 06/10/16  Yes Tanda Rockers, MD  ALPHAGAN P 0.15 % ophthalmic solution Place 2 drops into the left eye 2 (two) times daily.  06/23/12  Yes [provider]  aspirin EC 81 MG tablet Take 81 mg by mouth daily.   Yes [provider]  beclomethasone (QVAR) 80 MCG/ACT inhaler USE 2 INHALATIONS ORALLY   TWICE DAILY 06/04/16  Yes Tanda Rockers, MD  cholecalciferol (VITAMIN D) 1000 UNITS tablet Take 1,000 Units by mouth daily.    Yes [provider]  furosemide (LASIX) 20 MG tablet Take 20 mg by mouth daily.  08/23/16 07/28/17 Yes [provider]  lidocaine (LIDODERM) 5 % Place 1 patch onto the skin daily as needed. Remove & Discard patch within 12 hours or as directed by MD   Yes [provider]  loperamide (IMODIUM) 2 MG capsule Take by mouth as needed for diarrhea or loose stools.   Yes [provider]  Magnesium 400 MG TABS Take 1 tablet by mouth every morning.    Yes [provider]  Multiple Vitamin (MULTIVITAMIN WITH MINERALS) TABS Take 1 tablet by mouth daily. Centrum Silver   Yes [provider]  omeprazole (PRILOSEC) 20 MG capsule Take 2 capsules by mouth 2 (two) times daily.  07/23/15  Yes [provider]  potassium chloride (K-DUR) 10 MEQ tablet Take 20 mEq by mouth 2 (two) times daily.   Yes [provider]  rosuvastatin (CRESTOR) 5 MG tablet Take 5 mg by mouth every morning.    Yes [provider]  sucralfate (CARAFATE) 1 GM/10ML suspension Take 1 g by mouth 4 (four) times daily -  with meals and at bedtime.   Yes [provider]  zolpidem (AMBIEN) 5 MG tablet Take 5 mg by mouth at bedtime as needed for sleep.   Yes [provider]      PHYSICAL EXAMINATION:   VITAL SIGNS: Blood pressure 117/63, pulse 62, temperature 97.6 F (36.4 C), temperature source Oral, resp. rate 13, height 5\' 7"   (1.702 m), weight 76.2 kg (168 lb), SpO2 95 %.  GENERAL:  76 y.o.-year-old patient lying in the bed with no acute distress.  EYES: Pupils equal, round, reactive to light and accommodation. No scleral icterus. Extraocular muscles intact.  HEENT: Head atraumatic, normocephalic. Oropharynx and nasopharynx clear.  NECK:  Supple, no jugular venous distention. No thyroid enlargement, no tenderness.  LUNGS: Normal breath sounds bilaterally, no wheezing, rales,rhonchi or crepitation. No use of accessory muscles of respiration.  CARDIOVASCULAR: S1, S2 normal. No murmurs, rubs, or gallops.  ABDOMEN: Soft, nontender, nondistended. Bowel sounds present. No organomegaly or mass.  EXTREMITIES: No pedal edema, cyanosis, or clubbing.  NEUROLOGIC: Cranial nerves II through XII are intact. Muscle strength 5/5 in all extremities. Sensation intact. Gait  not checked.  PSYCHIATRIC: The patient is alert and oriented x 3.  SKIN: No obvious rash, lesion, or ulcer.   LABORATORY PANEL:   CBC  Recent Labs Lab 04/06/17 0533  WBC 5.5  HGB 13.1  HCT 38.6  PLT 334  MCV 95.4  MCH 32.4  MCHC 33.9  RDW 13.4   ------------------------------------------------------------------------------------------------------------------  Chemistries   Recent Labs Lab 04/06/17 0533  NA 140  K 3.5  CL 102  CO2 32  GLUCOSE 107*  BUN 22*  CREATININE 0.73  CALCIUM 9.2   ------------------------------------------------------------------------------------------------------------------ estimated creatinine clearance is 61.7 mL/min (by C-G formula based on SCr of 0.73 mg/dL). ------------------------------------------------------------------------------------------------------------------ No results for input(s): TSH, T4TOTAL, T3FREE, THYROIDAB in the last 72 hours.  Invalid input(s): FREET3   Coagulation profile No results for input(s): INR, PROTIME in the last 168  hours. ------------------------------------------------------------------------------------------------------------------- No results for input(s): DDIMER in the last 72 hours. -------------------------------------------------------------------------------------------------------------------  Cardiac Enzymes  Recent Labs Lab 04/06/17 0533  TROPONINI <0.03   ------------------------------------------------------------------------------------------------------------------ Invalid input(s): POCBNP  ---------------------------------------------------------------------------------------------------------------  Urinalysis    Component Value Date/Time   COLORURINE YELLOW 09/10/2010 0720   APPEARANCEUR CLEAR 09/10/2010 0720   LABSPEC 1.019 09/10/2010 0720   PHURINE 7.5 09/10/2010 0720   GLUCOSEU NEGATIVE 09/10/2010 0720   HGBUR NEGATIVE 09/10/2010 0720   BILIRUBINUR NEGATIVE 09/10/2010 0720   KETONESUR NEGATIVE 09/10/2010 0720   PROTEINUR NEGATIVE 09/10/2010 0720   UROBILINOGEN 0.2 09/10/2010 0720   NITRITE NEGATIVE 09/10/2010 0720   LEUKOCYTESUR  09/10/2010 0720    NEGATIVE MICROSCOPIC NOT DONE ON URINES WITH NEGATIVE PROTEIN, BLOOD, LEUKOCYTES, NITRITE, OR GLUCOSE <1000 mg/dL.     RADIOLOGY: Dg Chest Portable 1 View  Result Date: 04/06/2017 CLINICAL DATA:  Acute onset of mid chest pain.  Initial encounter. EXAM: PORTABLE CHEST 1 VIEW COMPARISON:  Chest radiograph performed 06/04/2016 FINDINGS: The lungs are well-aerated and clear. There is no evidence of focal opacification, pleural effusion or pneumothorax. Mild scarring is noted near the lung apices. The cardiomediastinal silhouette is enlarged. No acute osseous abnormalities are seen. IMPRESSION: Mild scarring near the lung apices. Lungs otherwise grossly clear. Cardiomegaly noted. Electronically Signed   By: Garald Balding M.D.   On: 04/06/2017 06:04    EKG: Orders placed or performed during the hospital encounter of  04/06/17  . ED EKG within 10 minutes  . ED EKG within 10 minutes  . EKG 12-Lead  . EKG 12-Lead    IMPRESSION AND PLAN: 76 year old female patient with history of sarcoidosis,anal cancer, hyperlipidemia, glaucoma presented to the emergency room with chest pain. Admitting diagnosis 1. Chest pain 2. Hyperlipidemia 3. Glaucoma 4. Sarcoidosis Treatment plan Admit patient to telemetry observation bed Patient is allergic to aspirin will start patient on Plavix Cycle troponin Check echocardiogram Cardiac stress test When necessary nitrates and morphine for chest pain All the records are reviewed and case discussed with ED provider. Management plans discussed with the patient, family and they are in agreement.  CODE STATUS:FULL CODE Code Status History    This patient does not have a recorded code status. Please follow your organizational policy for patients in this situation.       TOTAL TIME TAKING CARE OF THIS PATIENT: 50 minutes.    Saundra Shelling M.D on 04/06/2017 at 7:13 AM  Between 7am to 6pm - Pager - [phone]  After 6pm go to [email or web address] - password [email or web address] [facility]  [facility]  Office  [phone]  CC: Primary care physician; Clarisse Gouge, MD

## 2017-04-11 ENCOUNTER — Other Ambulatory Visit: Payer: Medicare Other

## 2017-04-11 ENCOUNTER — Ambulatory Visit: Payer: Medicare Other | Admitting: Internal Medicine

## 2017-04-22 ENCOUNTER — Inpatient Hospital Stay: Payer: Medicare Other

## 2017-04-22 ENCOUNTER — Inpatient Hospital Stay: Payer: Medicare Other | Attending: Internal Medicine | Admitting: Internal Medicine

## 2017-04-22 VITALS — BP 128/77 | HR 69 | Temp 97.9°F | Wt 164.4 lb

## 2017-04-22 DIAGNOSIS — Z85048 Personal history of other malignant neoplasm of rectum, rectosigmoid junction, and anus: Secondary | ICD-10-CM | POA: Diagnosis present

## 2017-04-22 DIAGNOSIS — H409 Unspecified glaucoma: Secondary | ICD-10-CM | POA: Diagnosis not present

## 2017-04-22 DIAGNOSIS — Z79899 Other long term (current) drug therapy: Secondary | ICD-10-CM | POA: Insufficient documentation

## 2017-04-22 DIAGNOSIS — J45909 Unspecified asthma, uncomplicated: Secondary | ICD-10-CM | POA: Diagnosis not present

## 2017-04-22 DIAGNOSIS — E7800 Pure hypercholesterolemia, unspecified: Secondary | ICD-10-CM | POA: Insufficient documentation

## 2017-04-22 DIAGNOSIS — Z7982 Long term (current) use of aspirin: Secondary | ICD-10-CM | POA: Diagnosis not present

## 2017-04-22 DIAGNOSIS — M8580 Other specified disorders of bone density and structure, unspecified site: Secondary | ICD-10-CM | POA: Insufficient documentation

## 2017-04-22 DIAGNOSIS — H3530 Unspecified macular degeneration: Secondary | ICD-10-CM | POA: Diagnosis not present

## 2017-04-22 DIAGNOSIS — I341 Nonrheumatic mitral (valve) prolapse: Secondary | ICD-10-CM | POA: Insufficient documentation

## 2017-04-22 DIAGNOSIS — Z8673 Personal history of transient ischemic attack (TIA), and cerebral infarction without residual deficits: Secondary | ICD-10-CM | POA: Diagnosis not present

## 2017-04-22 DIAGNOSIS — D869 Sarcoidosis, unspecified: Secondary | ICD-10-CM | POA: Diagnosis not present

## 2017-04-22 DIAGNOSIS — C44500 Unspecified malignant neoplasm of anal skin: Secondary | ICD-10-CM

## 2017-04-22 NOTE — Progress Notes (Signed)
Patient here today for follow up.   

## 2017-04-22 NOTE — Progress Notes (Signed)
[place] OFFICE PROGRESS NOTE  Patient Care Team: Clarisse Gouge, MD as PCP - General (Family Medicine) Hayden Pedro, MD (Ophthalmology)  No matching staging information was found for the patient.   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 PET 2016-rectal/anal uptake; No distant metastatic disease/lymphadenopathy. PLAN Surveillance   # History of sarcoidosis- PET scan-calcified granulomas/mild hilar/mediastinal adenopathy; # Colo [Nov 2016; Dr.Oh]-negative for any lesions.      Cancer of anal margin    INTERVAL HISTORY:  Lisa [name] 76 year old female patient with above history of stage I anal cancer s/p excision is here for follow-up.   Patient had been evaluated with a endoscopy in June 2018 with  Dr. Tamala Julian. Overall she feels well. Patient denies any blood in stools black stools. Denies any pain.  REVIEW OF SYSTEMS:  A complete 10 point review of system is done which is negative except mentioned above/history of present illness.   PAST MEDICAL HISTORY :  Past Medical History:  Diagnosis Date  . Arthritis   . Asthma   . Cancer (HCC)    anal  . Carotid stenosis   . Colon polyp 05/22/2013  . Foot mass   . Glaucoma (increased eye pressure)   . Headache   . Heart murmur   . Hypercholesterolemia   . Hyperlipidemia   . Irregular heartbeat   . Localized primary osteoarthritis of lower leg   . Macular degeneration   . Mitral valve prolapse   . Neck pain   . Osteopenia   . Sarcoidosis   . Sarcoidosis   . Shortness of breath dyspnea    with exertion  . TIA (transient ischemic attack)     PAST SURGICAL HISTORY :   Past Surgical History:  Procedure Laterality Date  . ABDOMINAL HYSTERECTOMY    . cataract surgery  08/18/09   left eye  .  COLONOSCOPY  05/22/2013  . COLONOSCOPY WITH PROPOFOL N/A 09/15/2015   Procedure: COLONOSCOPY WITH PROPOFOL;  Surgeon: Hulen Luster, MD;  Location: [facility] ENDOSCOPY;  Service: Gastroenterology;  Laterality: N/A;  . DILATION AND CURETTAGE OF UTERUS    . EYE SURGERY Bilateral    cataract extraction  . HEMORRHOID SURGERY N/A 07/24/2015   Procedure: HEMORRHOIDECTOMY/INTERNAL AND EXTERNAL;  Surgeon: Leonie Green, MD;  Location: [facility] ORS;  Service: General;  Laterality: N/A;  . hysterectomy with ovaries intact    . MASS EXCISION N/A 08/21/2015   Procedure: EXCISION MASS/ ANAL MASS;  Surgeon: Leonie Green, MD;  Location: [facility] ORS;  Service: General;  Laterality: N/A;  . TONSILLECTOMY    . TUMOR EXCISION N/A 09/16/2015   Procedure: TUMOR EXCISION RECTAL;  Surgeon: Leonie Green, MD;  Location: [facility] ORS;  Service: General;  Laterality: N/A;    FAMILY HISTORY :  No family history on file.  SOCIAL HISTORY:   Social History  Substance Use Topics  . Smoking status: Never Smoker  . Smokeless tobacco: Never Used  . Alcohol use No    ALLERGIES:  is allergic to aspirin; dairy aid [lactase]; diazepam; and shellfish-derived products.  MEDICATIONS:  Current Outpatient Prescriptions  Medication Sig Dispense Refill  . albuterol (PROAIR HFA) 108 (90 Base) MCG/ACT inhaler Inhale 2 puffs into the lungs every 6 (six) hours as needed for wheezing or shortness of breath. 3 Inhaler  1  . ALPHAGAN P 0.15 % ophthalmic solution Place 2 drops into the left eye 2 (two) times daily.     Marland Kitchen aspirin EC 81 MG tablet Take 81 mg by mouth daily.    . beclomethasone (QVAR) 80 MCG/ACT inhaler USE 2 INHALATIONS ORALLY   TWICE DAILY 3 Inhaler 3  . celecoxib (CELEBREX) 200 MG capsule Take 200 mg by mouth daily.    . cholecalciferol (VITAMIN D) 1000 UNITS tablet Take 1,000 Units by mouth daily.     . furosemide (LASIX) 20 MG tablet Take 20 mg by mouth daily.     Marland Kitchen lidocaine (LIDODERM) 5 % Place 1 patch onto the  skin daily as needed. Remove & Discard patch within 12 hours or as directed by MD    . loperamide (IMODIUM) 2 MG capsule Take by mouth as needed for diarrhea or loose stools.    . Magnesium 400 MG TABS Take 1 tablet by mouth every morning.     . Multiple Vitamin (MULTIVITAMIN WITH MINERALS) TABS Take 1 tablet by mouth daily. Centrum Silver    . Multiple Vitamins-Minerals (PRESERVISION AREDS PO) Take by mouth.    Marland Kitchen omeprazole (PRILOSEC) 20 MG capsule Take 2 capsules by mouth 2 (two) times daily.   11  . potassium chloride (K-DUR) 10 MEQ tablet Take 20 mEq by mouth 2 (two) times daily.    . rosuvastatin (CRESTOR) 5 MG tablet Take 5 mg by mouth every morning.     . sucralfate (CARAFATE) 1 GM/10ML suspension Take 1 g by mouth 4 (four) times daily -  with meals and at bedtime.    . valACYclovir (VALTREX) 1000 MG tablet Take by mouth.    . zolpidem (AMBIEN) 5 MG tablet Take 5 mg by mouth at bedtime as needed for sleep.     No current facility-administered medications for this visit.     PHYSICAL EXAMINATION: ECOG PERFORMANCE STATUS: 0 - Asymptomatic  BP 128/77 (BP Location: Right Arm, Patient Position: Sitting)   Pulse 69   Temp 97.9 F (36.6 C) (Tympanic)   Wt 164 lb 6 oz (74.6 kg)   BMI 25.74 kg/m   Filed Weights   04/22/17 1417  Weight: 164 lb 6 oz (74.6 kg)    GENERAL: Well-nourished well-developed; Alert, no distress and comfortable. She is alone. EYES: no pallor or icterus OROPHARYNX: no thrush or ulceration; good dentition  NECK: supple, no masses felt LYMPH:  no palpable lymphadenopathy in the cervical, axillary or inguinal regions LUNGS: clear to auscultation and  No wheeze or crackles HEART/CVS: regular rate & rhythm and no murmurs; No lower extremity edema ABDOMEN:abdomen soft, non-tender and normal bowel sounds Musculoskeletal:no cyanosis of digits and no clubbing  PSYCH: alert & oriented x 3 with fluent speech NEURO: no focal motor/sensory deficits SKIN:  no rashes or  significant lesions  LABORATORY DATA:  I have reviewed the data as listed    Component Value Date/Time   NA 140 04/06/2017 0533   K 3.5 04/06/2017 0533   CL 102 04/06/2017 0533   CO2 32 04/06/2017 0533   GLUCOSE 107 (H) 04/06/2017 0533   BUN 22 (H) 04/06/2017 0533   CREATININE 0.73 04/06/2017 0533   CALCIUM 9.2 04/06/2017 0533   PROT 7.4 07/15/2015 1407   ALBUMIN 4.0 07/15/2015 1407   AST 18 07/15/2015 1407   ALT 17 07/15/2015 1407   ALKPHOS 69 07/15/2015 1407   BILITOT 0.5 07/15/2015 1407   GFRNONAA >60 04/06/2017 0533   GFRAA >60  04/06/2017 0533    No results found for: SPEP, UPEP  Lab Results  Component Value Date   WBC 5.5 04/06/2017   NEUTROABS 4.9 07/15/2015   HGB 13.1 04/06/2017   HCT 38.6 04/06/2017   MCV 95.4 04/06/2017   PLT 334 04/06/2017      Chemistry      Component Value Date/Time   NA 140 04/06/2017 0533   K 3.5 04/06/2017 0533   CL 102 04/06/2017 0533   CO2 32 04/06/2017 0533   BUN 22 (H) 04/06/2017 0533   CREATININE 0.73 04/06/2017 0533      Component Value Date/Time   CALCIUM 9.2 04/06/2017 0533   ALKPHOS 69 07/15/2015 1407   AST 18 07/15/2015 1407   ALT 17 07/15/2015 1407   BILITOT 0.5 07/15/2015 1407       ASSESSMENT & PLAN:  Cancer of anal margin Stage I anal cancer/Margin. Status post resection 2; positive for AIN. No further therapy recommended/continue surveillance.  # Clinically no evidence of recurrence. Continue follow-up/ evaluation with Dr. Tamala Julian. His appointment again in 6 months.  # follow up with me only as needed.  # Follow-up with me in one year. All questions were answered. The patient knows to call the clinic with any problems, questions or concerns.   No orders of the defined types were placed in this encounter.     Cammie Sickle, MD 04/22/2017 5:24 PM

## 2017-04-22 NOTE — Assessment & Plan Note (Addendum)
Stage I anal cancer/Margin. Status post resection 2; positive for AIN. No further therapy recommended/continue surveillance.  # Clinically no evidence of recurrence. Continue follow-up/ evaluation with Dr. Tamala Julian. His appointment again in 6 months.  # follow up with me only as needed.

## 2017-05-26 DIAGNOSIS — I272 Pulmonary hypertension, unspecified: Secondary | ICD-10-CM | POA: Insufficient documentation

## 2017-05-26 DIAGNOSIS — Z85048 Personal history of other malignant neoplasm of rectum, rectosigmoid junction, and anus: Secondary | ICD-10-CM | POA: Insufficient documentation

## 2017-05-26 DIAGNOSIS — Z8673 Personal history of transient ischemic attack (TIA), and cerebral infarction without residual deficits: Secondary | ICD-10-CM | POA: Insufficient documentation

## 2017-05-26 DIAGNOSIS — K811 Chronic cholecystitis: Secondary | ICD-10-CM | POA: Insufficient documentation

## 2017-06-07 DIAGNOSIS — Z9049 Acquired absence of other specified parts of digestive tract: Secondary | ICD-10-CM | POA: Insufficient documentation

## 2017-06-07 DIAGNOSIS — K644 Residual hemorrhoidal skin tags: Secondary | ICD-10-CM | POA: Insufficient documentation

## 2017-06-07 DIAGNOSIS — R194 Change in bowel habit: Secondary | ICD-10-CM | POA: Insufficient documentation

## 2017-07-16 ENCOUNTER — Other Ambulatory Visit: Payer: Self-pay | Admitting: Internal Medicine

## 2017-12-05 ENCOUNTER — Other Ambulatory Visit (HOSPITAL_COMMUNITY): Payer: Self-pay | Admitting: Nurse Practitioner

## 2017-12-05 ENCOUNTER — Other Ambulatory Visit: Payer: Self-pay | Admitting: Nurse Practitioner

## 2017-12-05 DIAGNOSIS — R1084 Generalized abdominal pain: Secondary | ICD-10-CM

## 2017-12-05 DIAGNOSIS — R634 Abnormal weight loss: Secondary | ICD-10-CM

## 2017-12-06 ENCOUNTER — Other Ambulatory Visit
Admission: RE | Admit: 2017-12-06 | Discharge: 2017-12-06 | Disposition: A | Discharge status: Home / Self Care | Payer: Medicare Other | Source: Ambulatory Visit | Attending: Nurse Practitioner | Admitting: Nurse Practitioner

## 2017-12-06 DIAGNOSIS — R197 Diarrhea, unspecified: Secondary | ICD-10-CM | POA: Diagnosis present

## 2017-12-06 LAB — GASTROINTESTINAL PANEL BY PCR, STOOL (REPLACES STOOL CULTURE)

## 2017-12-06 LAB — C DIFFICILE QUICK SCREEN W PCR REFLEX
C Diff antigen: NEGATIVE
C Diff interpretation: NOT DETECTED
C Diff toxin: NEGATIVE

## 2017-12-16 ENCOUNTER — Ambulatory Visit
Admission: RE | Admit: 2017-12-16 | Discharge: 2017-12-16 | Disposition: A | Discharge status: Home / Self Care | Payer: Medicare Other | Source: Ambulatory Visit | Attending: Nurse Practitioner | Admitting: Nurse Practitioner

## 2017-12-16 DIAGNOSIS — K5730 Diverticulosis of large intestine without perforation or abscess without bleeding: Secondary | ICD-10-CM | POA: Diagnosis not present

## 2017-12-16 DIAGNOSIS — R1084 Generalized abdominal pain: Secondary | ICD-10-CM

## 2017-12-16 DIAGNOSIS — R634 Abnormal weight loss: Secondary | ICD-10-CM

## 2017-12-16 MED ORDER — IOPAMIDOL (ISOVUE-300) INJECTION 61%
100.0000 mL | Freq: Once | INTRAVENOUS | Status: AC | PRN
  Administered 2017-12-16: 100 mL via INTRAVENOUS

## 2017-12-20 ENCOUNTER — Ambulatory Visit: Admit: 2017-12-20 | Payer: Medicare Other | Admitting: Unknown Physician Specialty

## 2017-12-20 SURGERY — COLONOSCOPY WITH PROPOFOL
Anesthesia: General

## 2017-12-27 ENCOUNTER — Ambulatory Visit: Payer: Medicare Other

## 2018-09-13 ENCOUNTER — Emergency Department (HOSPITAL_COMMUNITY)
Admission: EM | Admit: 2018-09-13 | Discharge: 2018-09-13 | Disposition: A | Discharge status: Home / Self Care | Payer: Medicare Other | Attending: Emergency Medicine | Admitting: Emergency Medicine

## 2018-09-13 ENCOUNTER — Emergency Department (HOSPITAL_COMMUNITY): Payer: Medicare Other

## 2018-09-13 ENCOUNTER — Other Ambulatory Visit: Payer: Self-pay

## 2018-09-13 ENCOUNTER — Encounter (HOSPITAL_COMMUNITY): Payer: Self-pay

## 2018-09-13 DIAGNOSIS — E785 Hyperlipidemia, unspecified: Secondary | ICD-10-CM | POA: Diagnosis not present

## 2018-09-13 DIAGNOSIS — I272 Pulmonary hypertension, unspecified: Secondary | ICD-10-CM | POA: Diagnosis not present

## 2018-09-13 DIAGNOSIS — R0602 Shortness of breath: Secondary | ICD-10-CM | POA: Diagnosis present

## 2018-09-13 DIAGNOSIS — Z79899 Other long term (current) drug therapy: Secondary | ICD-10-CM | POA: Insufficient documentation

## 2018-09-13 LAB — CBC
HCT: 42.3 % (ref 36.0–46.0)
HEMOGLOBIN: 13.4 g/dL (ref 12.0–15.0)
MCH: 30.9 pg (ref 26.0–34.0)
MCHC: 31.7 g/dL (ref 30.0–36.0)
MCV: 97.7 fL (ref 80.0–100.0)
Platelets: 324 10*3/uL (ref 150–400)
RBC: 4.33 MIL/uL (ref 3.87–5.11)
RDW: 12.8 % (ref 11.5–15.5)
WBC: 6.4 10*3/uL (ref 4.0–10.5)
nRBC: 0 % (ref 0.0–0.2)

## 2018-09-13 LAB — BASIC METABOLIC PANEL
Anion gap: 9 (ref 5–15)
BUN: 19 mg/dL (ref 8–23)
CHLORIDE: 105 mmol/L (ref 98–111)
CO2: 28 mmol/L (ref 22–32)
Calcium: 9.3 mg/dL (ref 8.9–10.3)
Creatinine, Ser: 0.57 mg/dL (ref 0.44–1.00)
GFR calc Af Amer: >60 mL/min (ref >60)
GFR calc non Af Amer: >60 mL/min (ref >60)
GLUCOSE: 86 mg/dL (ref 70–99)
POTASSIUM: 3.9 mmol/L (ref 3.5–5.1)
Sodium: 142 mmol/L (ref 135–145)

## 2018-09-13 LAB — I-STAT TROPONIN, ED: Troponin i, poc: 0 ng/mL (ref 0.00–0.08)

## 2018-09-13 LAB — BRAIN NATRIURETIC PEPTIDE: B Natriuretic Peptide: 19.5 pg/mL (ref 0.0–100.0)

## 2018-09-13 MED ORDER — FUROSEMIDE 10 MG/ML IJ SOLN
20.0000 mg | Freq: Once | INTRAMUSCULAR | Status: AC
  Administered 2018-09-13: 20 mg via INTRAVENOUS
  Filled 2018-09-13: qty 4

## 2018-09-13 NOTE — Discharge Instructions (Addendum)
You were evaluated in the Emergency Department and after careful evaluation, we did not find any emergent condition requiring admission or further testing in the hospital.  Your symptoms today seem to be related to your recent medication change.  I could also be related to your sarcoidosis.  Your labs and chest x-ray today were very reassuring.  It is important that you call your PCP for further recommendations.  Please return to the Emergency Department if you experience any worsening of your condition.  We encourage you to follow up with a primary care provider.  Thank you for allowing [place] to be a part of your care.

## 2018-09-13 NOTE — ED Triage Notes (Signed)
Pt arrives POV from home with c/o Shortness of breath. Pt reports SOB for 2-3 weeks worsening today. Pt reports that she has had increased swelling to ankles and was started on another diuretic. Pt reports that her inhalers were helping with the SOB but are no longer helping. Pt denies chest pain.

## 2018-09-13 NOTE — ED Provider Notes (Signed)
[facility] Emergency Department Provider Note MRN:  [id number]  Arrival date & time: 09/13/18     Chief Complaint   Shortness of Breath   History of Present Illness   Lisa [name] is a 77 y.o. year-old female with a history of pulmonary artery hypertension, sarcoidosis presenting to the ED with chief complaint of shortness of breath.  To 3 weeks of progressively worsening shortness of breath.  Patient was recently taken off of her Lasix and spironolactone was started.  Noticing more swelling in her bilateral lower extremities.  Shortness of breath is worse with exertion.  Denies chest pain, no headache and vision change, no abdominal pain.  Review of Systems  A complete 10 system review of systems was obtained and all systems are negative except as noted in the HPI and PMH.   Patient's Health History    Past Medical History:  Diagnosis Date  . Arthritis   . Asthma   . Cancer (HCC)    anal  . Carotid stenosis   . Colon polyp 05/22/2013  . Foot mass   . Glaucoma (increased eye pressure)   . Headache   . Heart murmur   . Hypercholesterolemia   . Hyperlipidemia   . Irregular heartbeat   . Localized primary osteoarthritis of lower leg   . Macular degeneration   . Mitral valve prolapse   . Neck pain   . Osteopenia   . Sarcoidosis   . Sarcoidosis   . Shortness of breath dyspnea    with exertion  . TIA (transient ischemic attack)     Past Surgical History:  Procedure Laterality Date  . ABDOMINAL HYSTERECTOMY    . cataract surgery  08/18/09   left eye  . COLONOSCOPY  05/22/2013  . COLONOSCOPY WITH PROPOFOL N/A 09/15/2015   Procedure: COLONOSCOPY WITH PROPOFOL;  Surgeon: Hulen Luster, MD;  Location: [facility] ENDOSCOPY;  Service: Gastroenterology;  Laterality: N/A;  . DILATION AND CURETTAGE OF UTERUS    . EYE SURGERY Bilateral    cataract extraction  . HEMORRHOID SURGERY N/A 07/24/2015   Procedure: HEMORRHOIDECTOMY/INTERNAL AND EXTERNAL;  Surgeon: Leonie Green, MD;  Location: [facility] ORS;  Service: General;  Laterality: N/A;  . hysterectomy with ovaries intact    . MASS EXCISION N/A 08/21/2015   Procedure: EXCISION MASS/ ANAL MASS;  Surgeon: Leonie Green, MD;  Location: [facility] ORS;  Service: General;  Laterality: N/A;  . TONSILLECTOMY    . TUMOR EXCISION N/A 09/16/2015   Procedure: TUMOR EXCISION RECTAL;  Surgeon: Leonie Green, MD;  Location: [facility] ORS;  Service: General;  Laterality: N/A;    History reviewed. No pertinent family history.  Social History   Socioeconomic History  . Marital status: Widowed    Spouse name: Not on file  . Number of children: 1  . Years of education: Not on file  . Highest education level: Not on file  Occupational History  . Occupation: retired from [employer]  . Financial resource strain: Not on file  . Food insecurity:    Worry: Not on file    Inability: Not on file  . Transportation needs:    Medical: Not on file    Non-medical: Not on file  Tobacco Use  . Smoking status: Never Smoker  . Smokeless tobacco: Never Used  Substance and Sexual Activity  . Alcohol use: No  . Drug use: No  . Sexual activity: Not on file  Lifestyle  . Physical activity:  Days per week: Not on file    Minutes per session: Not on file  . Stress: Not on file  Relationships  . Social connections:    Talks on phone: Not on file    Gets together: Not on file    Attends religious service: Not on file    Active member of club or organization: Not on file    Attends meetings of clubs or organizations: Not on file    Relationship status: Not on file  . Intimate partner violence:    Fear of current or ex partner: Not on file    Emotionally abused: Not on file    Physically abused: Not on file    Forced sexual activity: Not on file  Other Topics Concern  . Not on file  Social History Narrative  . Not on file     Physical Exam  Vital Signs and Nursing Notes reviewed Vitals:    09/13/18 1232 09/13/18 1300  BP:  (!) 149/81  Pulse: 89 72  Resp: 16 16  Temp:    SpO2: (!) 89% 100%    CONSTITUTIONAL: Well-appearing, NAD NEURO:  Alert and oriented x 3, no focal deficits EYES:  eyes equal and reactive ENT/NECK:  no LAD, no JVD CARDIO: Regular rate, well-perfused, normal S1 and S2 PULM:  CTAB no wheezing or rhonchi GI/GU:  normal bowel sounds, non-distended, non-tender MSK/SPINE:  No gross deformities, no edema SKIN:  no rash, atraumatic PSYCH:  Appropriate speech and behavior  Diagnostic and Interventional Summary    EKG Interpretation  Date/Time:  Wednesday September 13 2018 11:28:39 EST Ventricular Rate:  94 PR Interval:    QRS Duration: 74 QT Interval:  377 QTC Calculation: 472 R Axis:   29 Text Interpretation:  Sinus rhythm Anteroseptal infarct, age indeterminate Minimal ST elevation, inferior leads Baseline wander in lead(s) I aVR Confirmed by Gerlene Fee [phone]) on 09/13/2018 11:33:43 AM      Labs Reviewed  CBC  BASIC METABOLIC PANEL  BRAIN NATRIURETIC PEPTIDE  I-STAT TROPONIN, ED    DG Chest 2 View  Final Result      Medications  furosemide (LASIX) injection 20 mg (20 mg Intravenous Given 09/13/18 1250)     Procedures Critical Care  ED Course and Medical Decision Making  I have reviewed the triage vital signs and the nursing notes.  Pertinent labs & imaging results that were available during my care of the patient were reviewed by me and considered in my medical decision making (see below for details).  Question of pulmonary edema in this 77 year old female with history of pulmonary artery hypertension, recent change to diuretics.  No chest pain, low concern for ACS or PE.  Patient continues to have reassuring vital signs during her ED visit, very well-appearing, labs reassuring.  Good response to IV Lasix here in the ED, ambulating to the restroom without assistance, without significant symptoms.  No significant leg tenderness, no  leg swelling asymmetry, no tachycardia, no hypoxia, no chest pain, very little concern for PE.  Appropriate for outpatient follow-up to discuss the recent medication change.  After the discussed management above, the patient was determined to be safe for discharge.  The patient was in agreement with this plan and all questions regarding their care were answered.  ED return precautions were discussed and the patient will return to the ED with any significant worsening of condition.  Barth Kirks. Sedonia Small, [place] [email or web address]  Final Clinical Impressions(s) /  ED Diagnoses     ICD-10-CM   1. SOB (shortness of breath) R06.02 DG Chest 2 View    DG Chest 2 View    ED Discharge Orders    None         Maudie Flakes, MD 09/13/18 1454

## 2018-10-27 ENCOUNTER — Other Ambulatory Visit: Payer: Self-pay | Admitting: Physician Assistant

## 2018-11-01 DIAGNOSIS — R0602 Shortness of breath: Secondary | ICD-10-CM | POA: Insufficient documentation

## 2019-04-16 ENCOUNTER — Other Ambulatory Visit: Payer: Self-pay

## 2019-04-16 ENCOUNTER — Inpatient Hospital Stay: Payer: Medicare Other

## 2019-04-16 ENCOUNTER — Emergency Department: Payer: Medicare Other

## 2019-04-16 ENCOUNTER — Encounter: Payer: Self-pay | Admitting: Internal Medicine

## 2019-04-16 ENCOUNTER — Inpatient Hospital Stay
Admission: EM | Admit: 2019-04-16 | Discharge: 2019-04-18 | DRG: 378 | Disposition: A | Discharge status: Home / Self Care | Payer: Medicare Other | Attending: Internal Medicine | Admitting: Internal Medicine

## 2019-04-16 DIAGNOSIS — K922 Gastrointestinal hemorrhage, unspecified: Secondary | ICD-10-CM

## 2019-04-16 DIAGNOSIS — K2970 Gastritis, unspecified, without bleeding: Secondary | ICD-10-CM | POA: Diagnosis not present

## 2019-04-16 DIAGNOSIS — E785 Hyperlipidemia, unspecified: Secondary | ICD-10-CM | POA: Diagnosis present

## 2019-04-16 DIAGNOSIS — Z7901 Long term (current) use of anticoagulants: Secondary | ICD-10-CM

## 2019-04-16 DIAGNOSIS — B9620 Unspecified Escherichia coli [E. coli] as the cause of diseases classified elsewhere: Secondary | ICD-10-CM | POA: Diagnosis present

## 2019-04-16 DIAGNOSIS — Z91013 Allergy to seafood: Secondary | ICD-10-CM

## 2019-04-16 DIAGNOSIS — I4891 Unspecified atrial fibrillation: Secondary | ICD-10-CM | POA: Diagnosis present

## 2019-04-16 DIAGNOSIS — K219 Gastro-esophageal reflux disease without esophagitis: Secondary | ICD-10-CM | POA: Diagnosis present

## 2019-04-16 DIAGNOSIS — I272 Pulmonary hypertension, unspecified: Secondary | ICD-10-CM | POA: Diagnosis present

## 2019-04-16 DIAGNOSIS — K2900 Acute gastritis without bleeding: Secondary | ICD-10-CM | POA: Diagnosis present

## 2019-04-16 DIAGNOSIS — Z809 Family history of malignant neoplasm, unspecified: Secondary | ICD-10-CM

## 2019-04-16 DIAGNOSIS — K5730 Diverticulosis of large intestine without perforation or abscess without bleeding: Secondary | ICD-10-CM | POA: Diagnosis present

## 2019-04-16 DIAGNOSIS — Z79899 Other long term (current) drug therapy: Secondary | ICD-10-CM | POA: Diagnosis not present

## 2019-04-16 DIAGNOSIS — Z85048 Personal history of other malignant neoplasm of rectum, rectosigmoid junction, and anus: Secondary | ICD-10-CM

## 2019-04-16 DIAGNOSIS — K449 Diaphragmatic hernia without obstruction or gangrene: Secondary | ICD-10-CM | POA: Diagnosis present

## 2019-04-16 DIAGNOSIS — K625 Hemorrhage of anus and rectum: Secondary | ICD-10-CM | POA: Diagnosis not present

## 2019-04-16 DIAGNOSIS — Z886 Allergy status to analgesic agent status: Secondary | ICD-10-CM

## 2019-04-16 DIAGNOSIS — I82402 Acute embolism and thrombosis of unspecified deep veins of left lower extremity: Secondary | ICD-10-CM | POA: Diagnosis present

## 2019-04-16 DIAGNOSIS — K641 Second degree hemorrhoids: Secondary | ICD-10-CM | POA: Diagnosis present

## 2019-04-16 DIAGNOSIS — Z91011 Allergy to milk products: Secondary | ICD-10-CM

## 2019-04-16 DIAGNOSIS — J310 Chronic rhinitis: Secondary | ICD-10-CM | POA: Diagnosis present

## 2019-04-16 DIAGNOSIS — Z888 Allergy status to other drugs, medicaments and biological substances status: Secondary | ICD-10-CM

## 2019-04-16 DIAGNOSIS — Z20828 Contact with and (suspected) exposure to other viral communicable diseases: Secondary | ICD-10-CM | POA: Diagnosis present

## 2019-04-16 DIAGNOSIS — Z7982 Long term (current) use of aspirin: Secondary | ICD-10-CM

## 2019-04-16 DIAGNOSIS — E876 Hypokalemia: Secondary | ICD-10-CM | POA: Diagnosis not present

## 2019-04-16 DIAGNOSIS — Z7951 Long term (current) use of inhaled steroids: Secondary | ICD-10-CM

## 2019-04-16 DIAGNOSIS — K621 Rectal polyp: Secondary | ICD-10-CM | POA: Diagnosis not present

## 2019-04-16 DIAGNOSIS — R609 Edema, unspecified: Secondary | ICD-10-CM | POA: Diagnosis present

## 2019-04-16 DIAGNOSIS — R319 Hematuria, unspecified: Secondary | ICD-10-CM | POA: Diagnosis present

## 2019-04-16 DIAGNOSIS — K635 Polyp of colon: Secondary | ICD-10-CM | POA: Diagnosis present

## 2019-04-16 DIAGNOSIS — D860 Sarcoidosis of lung: Secondary | ICD-10-CM | POA: Diagnosis present

## 2019-04-16 DIAGNOSIS — I10 Essential (primary) hypertension: Secondary | ICD-10-CM | POA: Diagnosis present

## 2019-04-16 DIAGNOSIS — Z90710 Acquired absence of both cervix and uterus: Secondary | ICD-10-CM

## 2019-04-16 DIAGNOSIS — K921 Melena: Secondary | ICD-10-CM | POA: Diagnosis not present

## 2019-04-16 DIAGNOSIS — D123 Benign neoplasm of transverse colon: Secondary | ICD-10-CM | POA: Diagnosis not present

## 2019-04-16 DIAGNOSIS — H409 Unspecified glaucoma: Secondary | ICD-10-CM | POA: Diagnosis present

## 2019-04-16 DIAGNOSIS — Z86711 Personal history of pulmonary embolism: Secondary | ICD-10-CM

## 2019-04-16 DIAGNOSIS — I82412 Acute embolism and thrombosis of left femoral vein: Secondary | ICD-10-CM | POA: Diagnosis not present

## 2019-04-16 DIAGNOSIS — Z8673 Personal history of transient ischemic attack (TIA), and cerebral infarction without residual deficits: Secondary | ICD-10-CM

## 2019-04-16 LAB — URINALYSIS, COMPLETE (UACMP) WITH MICROSCOPIC
Bacteria, UA: NONE SEEN
RBC / HPF: >50 RBC/hpf — ABNORMAL HIGH (ref 0–5)
Specific Gravity, Urine: 1.016 (ref 1.005–1.030)
WBC, UA: >50 WBC/hpf — ABNORMAL HIGH (ref 0–5)

## 2019-04-16 LAB — CBC
HCT: 41.3 % (ref 36.0–46.0)
Hemoglobin: 13.4 g/dL (ref 12.0–15.0)
MCH: 31.9 pg (ref 26.0–34.0)
MCHC: 32.4 g/dL (ref 30.0–36.0)
MCV: 98.3 fL (ref 80.0–100.0)
Platelets: 289 10*3/uL (ref 150–400)
RBC: 4.2 MIL/uL (ref 3.87–5.11)
RDW: 13 % (ref 11.5–15.5)
WBC: 9.8 10*3/uL (ref 4.0–10.5)
nRBC: 0 % (ref 0.0–0.2)

## 2019-04-16 LAB — COMPREHENSIVE METABOLIC PANEL
ALT: 19 U/L (ref 0–44)
AST: 13 U/L — ABNORMAL LOW (ref 15–41)
Albumin: 3.9 g/dL (ref 3.5–5.0)
Alkaline Phosphatase: 59 U/L (ref 38–126)
Anion gap: 9 (ref 5–15)
BUN: 22 mg/dL (ref 8–23)
CO2: 27 mmol/L (ref 22–32)
Calcium: 9.2 mg/dL (ref 8.9–10.3)
Chloride: 104 mmol/L (ref 98–111)
Creatinine, Ser: 0.57 mg/dL (ref 0.44–1.00)
GFR calc Af Amer: >60 mL/min (ref >60)
GFR calc non Af Amer: >60 mL/min (ref >60)
Glucose, Bld: 109 mg/dL — ABNORMAL HIGH (ref 70–99)
Potassium: 4.1 mmol/L (ref 3.5–5.1)
Sodium: 140 mmol/L (ref 135–145)
Total Bilirubin: 0.4 mg/dL (ref 0.3–1.2)
Total Protein: 6.7 g/dL (ref 6.5–8.1)

## 2019-04-16 LAB — SARS CORONAVIRUS 2 BY RT PCR (HOSPITAL ORDER, PERFORMED IN ~~LOC~~ HOSPITAL LAB): SARS Coronavirus 2: NEGATIVE

## 2019-04-16 LAB — TYPE AND SCREEN
ABO/RH(D): A POS
Antibody Screen: NEGATIVE

## 2019-04-16 LAB — PROTIME-INR
INR: 1 (ref 0.8–1.2)
Prothrombin Time: 12.9 seconds (ref 11.4–15.2)

## 2019-04-16 LAB — HEMOGLOBIN: Hemoglobin: 12.9 g/dL (ref 12.0–15.0)

## 2019-04-16 LAB — ABO/RH: ABO/RH(D): A POS

## 2019-04-16 MED ORDER — ACETAMINOPHEN 650 MG RE SUPP: 650.0000 mg | Freq: Four times a day (QID) | RECTAL | Status: DC | PRN

## 2019-04-16 MED ORDER — BUDESONIDE 0.25 MG/2ML IN SUSP: 0.2500 mg | Freq: Two times a day (BID) | RESPIRATORY_TRACT | Status: DC

## 2019-04-16 MED ORDER — IOHEXOL 300 MG/ML  SOLN
100.0000 mL | Freq: Once | INTRAMUSCULAR | Status: AC | PRN
  Administered 2019-04-16: 100 mL via INTRAVENOUS

## 2019-04-16 MED ORDER — BECLOMETHASONE DIPROP HFA 80 MCG/ACT IN AERB: 1.0000 | INHALATION_SPRAY | Freq: Two times a day (BID) | RESPIRATORY_TRACT | Status: DC

## 2019-04-16 MED ORDER — BUDESONIDE 3 MG PO CPEP
9.0000 mg | ORAL_CAPSULE | Freq: Every day | ORAL | Status: DC
  Administered 2019-04-16 – 2019-04-18 (×2): 9 mg via ORAL
  Filled 2019-04-16 (×3): qty 3

## 2019-04-16 MED ORDER — MAGNESIUM OXIDE 400 (241.3 MG) MG PO TABS
400.0000 mg | ORAL_TABLET | Freq: Every day | ORAL | Status: DC
  Administered 2019-04-16 – 2019-04-18 (×2): 400 mg via ORAL
  Filled 2019-04-16 (×2): qty 1

## 2019-04-16 MED ORDER — SODIUM CHLORIDE 0.9 % IV SOLN
INTRAVENOUS | Status: DC
  Administered 2019-04-16 – 2019-04-17 (×3): via INTRAVENOUS

## 2019-04-16 MED ORDER — ROSUVASTATIN CALCIUM 10 MG PO TABS
5.0000 mg | ORAL_TABLET | ORAL | Status: DC
  Administered 2019-04-18: 06:00:00 5 mg via ORAL
  Filled 2019-04-16: qty 1

## 2019-04-16 MED ORDER — BUDESONIDE 0.25 MG/2ML IN SUSP
0.2500 mg | Freq: Two times a day (BID) | RESPIRATORY_TRACT | Status: DC
  Administered 2019-04-17 – 2019-04-18 (×3): 0.25 mg via RESPIRATORY_TRACT
  Filled 2019-04-16 (×3): qty 2

## 2019-04-16 MED ORDER — LIDOCAINE 5 % EX PTCH
1.0000 | MEDICATED_PATCH | Freq: Every day | CUTANEOUS | Status: DC | PRN
  Filled 2019-04-16: qty 1

## 2019-04-16 MED ORDER — ONDANSETRON HCL 4 MG PO TABS: 4.0000 mg | ORAL_TABLET | Freq: Four times a day (QID) | ORAL | Status: DC | PRN

## 2019-04-16 MED ORDER — BRIMONIDINE TARTRATE 0.15 % OP SOLN
2.0000 [drp] | Freq: Two times a day (BID) | OPHTHALMIC | Status: DC
  Administered 2019-04-16 – 2019-04-18 (×4): 2 [drp] via OPHTHALMIC
  Filled 2019-04-16: qty 5

## 2019-04-16 MED ORDER — ACETAMINOPHEN 325 MG PO TABS: 650.0000 mg | ORAL_TABLET | Freq: Four times a day (QID) | ORAL | Status: DC | PRN

## 2019-04-16 MED ORDER — VITAMIN D 25 MCG (1000 UNIT) PO TABS
1000.0000 [IU] | ORAL_TABLET | Freq: Every day | ORAL | Status: DC
  Administered 2019-04-18: 1000 [IU] via ORAL
  Filled 2019-04-16: qty 1

## 2019-04-16 MED ORDER — FUROSEMIDE 40 MG PO TABS
40.0000 mg | ORAL_TABLET | Freq: Every day | ORAL | Status: DC
  Administered 2019-04-17 – 2019-04-18 (×2): 40 mg via ORAL
  Filled 2019-04-16 (×2): qty 1

## 2019-04-16 MED ORDER — POTASSIUM CHLORIDE ER 10 MEQ PO TBCR
10.0000 meq | EXTENDED_RELEASE_TABLET | Freq: Every day | ORAL | Status: DC
  Administered 2019-04-16 – 2019-04-18 (×2): 10 meq via ORAL
  Filled 2019-04-16 (×5): qty 1

## 2019-04-16 MED ORDER — PANTOPRAZOLE SODIUM 40 MG IV SOLR
40.0000 mg | Freq: Two times a day (BID) | INTRAVENOUS | Status: DC
  Administered 2019-04-16 – 2019-04-18 (×5): 40 mg via INTRAVENOUS
  Filled 2019-04-16 (×5): qty 40

## 2019-04-16 MED ORDER — ONDANSETRON HCL 4 MG/2ML IJ SOLN: 4.0000 mg | Freq: Four times a day (QID) | INTRAMUSCULAR | Status: DC | PRN

## 2019-04-16 MED ORDER — OCUVITE-LUTEIN PO CAPS
1.0000 | ORAL_CAPSULE | Freq: Every day | ORAL | Status: DC
  Administered 2019-04-16 – 2019-04-18 (×2): 1 via ORAL
  Filled 2019-04-16 (×3): qty 1

## 2019-04-16 MED ORDER — PEG 3350-KCL-NA BICARB-NACL 420 G PO SOLR
4000.0000 mL | Freq: Once | ORAL | Status: DC
  Filled 2019-04-16: qty 4000

## 2019-04-16 MED ORDER — PEG 3350-KCL-NA BICARB-NACL 420 G PO SOLR
4000.0000 mL | Freq: Once | ORAL | Status: AC
  Administered 2019-04-16: 4000 mL via ORAL
  Filled 2019-04-16: qty 4000

## 2019-04-16 MED ORDER — SODIUM CHLORIDE 0.9 % IV SOLN
1.0000 g | INTRAVENOUS | Status: DC
  Administered 2019-04-17 – 2019-04-18 (×2): 1 g via INTRAVENOUS
  Filled 2019-04-16 (×2): qty 1

## 2019-04-16 MED ORDER — SODIUM CHLORIDE 0.9 % IV SOLN
2.0000 g | Freq: Once | INTRAVENOUS | Status: AC
  Administered 2019-04-16: 12:00:00 2 g via INTRAVENOUS
  Filled 2019-04-16: qty 20

## 2019-04-16 MED ORDER — SPIRONOLACTONE 25 MG PO TABS
50.0000 mg | ORAL_TABLET | Freq: Every day | ORAL | Status: DC
  Administered 2019-04-16 – 2019-04-18 (×2): 50 mg via ORAL
  Filled 2019-04-16 (×2): qty 2

## 2019-04-16 MED ORDER — ALBUTEROL SULFATE (2.5 MG/3ML) 0.083% IN NEBU: 2.5000 mg | INHALATION_SOLUTION | Freq: Four times a day (QID) | RESPIRATORY_TRACT | Status: DC | PRN

## 2019-04-16 NOTE — Plan of Care (Signed)
  Problem: Education: Goal: Knowledge of General Education information will improve Description: Including pain rating scale, medication(s)/side effects and non-pharmacologic comfort measures Outcome: Progressing   Problem: Clinical Measurements: Goal: Ability to maintain clinical measurements within normal limits will improve Outcome: Progressing   

## 2019-04-16 NOTE — ED Notes (Signed)
Assisted Dr Ellender Hose with rectal exam, occult negative

## 2019-04-16 NOTE — ED Notes (Signed)
ED TO INPATIENT HANDOFF REPORT  ED Nurse Name and Phone #:  Mickel Baas [phone]  S Name/Age/Gender Lisa [name] 78 y.o. female Room/Bed: ED03A/ED03A  Code Status   Code Status: Prior  Home/SNF/Other Home Patient oriented to: self, place, time and situation Is this baseline? Yes   Triage Complete: Triage complete  Chief Complaint [facility] - Rectal bleeding  Triage Note Pt states rectal bleeding for three days. Pt states she is on xarelto. Pt states "i'm just pouring blood". Pt with normal color warm and dry skin with stable vital signs.    Allergies Allergies  Allergen Reactions  . Aspirin Other (See Comments)    Pt can take low dose aspirin with no reaction  . Dairy Aid [Lactase] Other (See Comments)  . Diazepam Other (See Comments)    Affected her hearing and sight  . Shellfish-Derived Products Swelling    Level of Care/Admitting Diagnosis ED Disposition    ED Disposition Condition [place]: [place] [[id number]]  Level of Care: Med-Surg [16]  Covid Evaluation: Person Under Investigation (PUI)  Isolation Risk Level: Low Risk/Droplet (Less than 4L [place] supplementation)  Diagnosis: GI bleed [235361]  Admitting Physician: Loletha Grayer [[id number]]  Attending Physician: Loletha Grayer [phone]  Estimated length of stay: past midnight tomorrow  Certification:: I certify this patient will need inpatient services for at least 2 midnights  PT Class (Do Not Modify): Inpatient [101]  PT Acc Code (Do Not Modify): Private [1]       B Medical/Surgery History Past Medical History:  Diagnosis Date  . Arthritis   . Asthma   . Cancer (HCC)    anal  . Carotid stenosis   . Colon polyp 05/22/2013  . Foot mass   . Glaucoma (increased eye pressure)   . Headache   . Heart murmur   . Hypercholesterolemia   . Hyperlipidemia   . Irregular heartbeat   . Localized primary osteoarthritis of lower leg   . Macular degeneration   . Mitral  valve prolapse   . Neck pain   . Osteopenia   . Sarcoidosis   . Sarcoidosis   . Shortness of breath dyspnea    with exertion  . TIA (transient ischemic attack)    Past Surgical History:  Procedure Laterality Date  . ABDOMINAL HYSTERECTOMY    . cataract surgery  08/18/09   left eye  . COLONOSCOPY  05/22/2013  . COLONOSCOPY WITH PROPOFOL N/A 09/15/2015   Procedure: COLONOSCOPY WITH PROPOFOL;  Surgeon: Hulen Luster, MD;  Location: [facility] ENDOSCOPY;  Service: Gastroenterology;  Laterality: N/A;  . DILATION AND CURETTAGE OF UTERUS    . EYE SURGERY Bilateral    cataract extraction  . HEMORRHOID SURGERY N/A 07/24/2015   Procedure: HEMORRHOIDECTOMY/INTERNAL AND EXTERNAL;  Surgeon: Leonie Green, MD;  Location: [facility] ORS;  Service: General;  Laterality: N/A;  . hysterectomy with ovaries intact    . MASS EXCISION N/A 08/21/2015   Procedure: EXCISION MASS/ ANAL MASS;  Surgeon: Leonie Green, MD;  Location: [facility] ORS;  Service: General;  Laterality: N/A;  . TONSILLECTOMY    . TUMOR EXCISION N/A 09/16/2015   Procedure: TUMOR EXCISION RECTAL;  Surgeon: Leonie Green, MD;  Location: [facility] ORS;  Service: General;  Laterality: N/A;     A IV Location/Drains/Wounds Patient Lines/Drains/Airways Status   Active Line/Drains/Airways    Name:   Placement date:   Placement time:   Site:   Days:   Peripheral  IV 04/16/19 Left Antecubital   04/16/19    1049    Antecubital   less than 1   Incision (Closed) 07/24/15 Perineum   07/24/15    1334     1362   Incision (Closed) 08/21/15 Rectum Other (Comment)   08/21/15    0802     1334   Incision (Closed) 09/16/15 Rectum   09/16/15    1123     1308          Intake/Output Last 24 hours  Intake/Output Summary (Last 24 hours) at 04/16/2019 1321 Last data filed at 04/16/2019 1217 Gross per 24 hour  Intake 100 ml  Output -  Net 100 ml    Labs/Imaging Results for orders placed or performed during the hospital encounter of 04/16/19 (from the past  48 hour(s))  Comprehensive metabolic panel     Status: Abnormal   Collection Time: 04/16/19  2:35 AM  Result Value Ref Range   Sodium 140 135 - 145 mmol/L   Potassium 4.1 3.5 - 5.1 mmol/L   Chloride 104 98 - 111 mmol/L   CO2 27 22 - 32 mmol/L   Glucose, Bld 109 (H) 70 - 99 mg/dL   BUN 22 8 - 23 mg/dL   Creatinine, Ser 0.57 0.44 - 1.00 mg/dL   Calcium 9.2 8.9 - 10.3 mg/dL   Total Protein 6.7 6.5 - 8.1 g/dL   Albumin 3.9 3.5 - 5.0 g/dL   AST 13 (L) 15 - 41 U/L   ALT 19 0 - 44 U/L   Alkaline Phosphatase 59 38 - 126 U/L   Total Bilirubin 0.4 0.3 - 1.2 mg/dL   GFR calc non Af Amer >60 >60 mL/min   GFR calc Af Amer >60 >60 mL/min   Anion gap 9 5 - 15    Comment: Performed at [facility], [address] [place], [place] [address]  CBC     Status: None   Collection Time: 04/16/19  2:35 AM  Result Value Ref Range   WBC 9.8 4.0 - 10.5 K/uL   RBC 4.20 3.87 - 5.11 MIL/uL   Hemoglobin 13.4 12.0 - 15.0 g/dL   HCT 41.3 36.0 - 46.0 %   MCV 98.3 80.0 - 100.0 fL   MCH 31.9 26.0 - 34.0 pg   MCHC 32.4 30.0 - 36.0 g/dL   RDW 13.0 11.5 - 15.5 %   Platelets 289 150 - 400 K/uL   nRBC 0.0 0.0 - 0.2 %    Comment: Performed at [facility] [address] [place], [place] [address]  Type and screen [place]     Status: None   Collection Time: 04/16/19  2:35 AM  Result Value Ref Range   ABO/RH(D) A POS    Antibody Screen NEG    Sample Expiration      04/19/2019,2359 Performed at [facility], [address] [place], [place] [address]   Protime-INR     Status: None   Collection Time: 04/16/19 10:28 AM  Result Value Ref Range   Prothrombin Time 12.9 11.4 - 15.2 seconds   INR 1.0 0.8 - 1.2    Comment: (NOTE) INR goal varies based on device and disease states. Performed at [facility], [address] [place], [name] [address]   Urinalysis, Complete w Microscopic     Status: Abnormal   Collection Time: 04/16/19 10:28 AM   Result Value Ref Range   Color, Urine RED (A) YELLOW  APPearance HAZY (A) CLEAR   Specific Gravity, Urine 1.016 1.005 - 1.030   pH  5.0 - 8.0    TEST NOT REPORTED DUE TO COLOR INTERFERENCE OF URINE PIGMENT   Glucose, UA (A) NEGATIVE mg/dL    TEST NOT REPORTED DUE TO COLOR INTERFERENCE OF URINE PIGMENT   Hgb urine dipstick (A) NEGATIVE    TEST NOT REPORTED DUE TO COLOR INTERFERENCE OF URINE PIGMENT   Bilirubin Urine (A) NEGATIVE    TEST NOT REPORTED DUE TO COLOR INTERFERENCE OF URINE PIGMENT   Ketones, ur (A) NEGATIVE mg/dL    TEST NOT REPORTED DUE TO COLOR INTERFERENCE OF URINE PIGMENT   Protein, ur (A) NEGATIVE mg/dL    TEST NOT REPORTED DUE TO COLOR INTERFERENCE OF URINE PIGMENT   Nitrite (A) NEGATIVE    TEST NOT REPORTED DUE TO COLOR INTERFERENCE OF URINE PIGMENT   Leukocytes,Ua (A) NEGATIVE    TEST NOT REPORTED DUE TO COLOR INTERFERENCE OF URINE PIGMENT   RBC / HPF >50 (H) 0 - 5 RBC/hpf   WBC, UA >50 (H) 0 - 5 WBC/hpf   Bacteria, UA NONE SEEN NONE SEEN   Squamous Epithelial / LPF 0-5 0 - 5   Mucus PRESENT     Comment: Performed at [facility], [address] [place], [place] [address]   Ct Abdomen Pelvis W Contrast  Result Date: 04/16/2019 CLINICAL DATA:  Rectal bleeding. EXAM: CT ABDOMEN AND PELVIS WITH CONTRAST TECHNIQUE: Multidetector CT imaging of the abdomen and pelvis was performed using the standard protocol following bolus administration of intravenous contrast. CONTRAST:  152mL OMNIPAQUE IOHEXOL 300 MG/ML  SOLN COMPARISON:  CT scan of December 16, 2017. FINDINGS: Lower chest: No acute abnormality. Hepatobiliary: No focal liver abnormality is seen. Status post cholecystectomy. No biliary dilatation. Pancreas: Unremarkable. No pancreatic ductal dilatation or surrounding inflammatory changes. Spleen: Normal in size without focal abnormality. Adrenals/Urinary Tract: Adrenal glands are unremarkable. Kidneys are normal, without renal calculi, focal lesion, or  hydronephrosis. Bladder is unremarkable. Stomach/Bowel: Stomach is within normal limits. Appendix appears normal. No evidence of bowel wall thickening, distention, or inflammatory changes. Sigmoid diverticulosis is noted without inflammation. Vascular/Lymphatic: No significant vascular findings are present. No enlarged abdominal or pelvic lymph nodes. Reproductive: Status post hysterectomy. No adnexal masses. Other: No abdominal wall hernia or abnormality. No abdominopelvic ascites. Musculoskeletal: No acute or significant osseous findings. IMPRESSION: Sigmoid diverticulosis without inflammation. No acute abnormality seen in the abdomen or pelvis. Electronically Signed   By: Marijo Conception M.D.   On: 04/16/2019 11:29    Pending Labs Unresulted Labs (From admission, onward)    Start     Ordered   04/16/19 1201  Urine culture  Add-on,   AD    Question:  Patient immune status  Answer:  Normal   04/16/19 1200   04/16/19 1053  Novel Coronavirus,NAA,(SEND-OUT TO REF LAB - TAT 24-48 hrs); Hosp Order  (Asymptomatic Patients Labs)  Once,   STAT    Question:  Rule Out  Answer:  Yes   04/16/19 1052          Vitals/Pain Today's Vitals   04/16/19 0228 04/16/19 0455 04/16/19 1051 04/16/19 1145  BP:  140/74 (!) 153/78   Pulse:  77 76   Resp:  18 16   Temp:      TempSrc:      SpO2:  96% 96%   Weight: 76.2 kg     Height: 5\' 7"  (1.702 m)  PainSc: 0-No pain   0-No pain    Isolation Precautions No active isolations  Medications Medications  iohexol (OMNIPAQUE) 300 MG/ML solution 100 mL (100 mLs Intravenous Contrast Given 04/16/19 1102)  cefTRIAXone (ROCEPHIN) 2 g in sodium chloride 0.9 % 100 mL IVPB (0 g Intravenous Stopped 04/16/19 1217)    Mobility walks Low fall risk   Focused Assessments patient with hematuria. reports blood in stool, however rectal exam in ED was negative   R Recommendations: See Admitting Provider Note  Report given to:   Additional Notes:

## 2019-04-16 NOTE — ED Notes (Signed)
Pt updated on wait.  °

## 2019-04-16 NOTE — ED Triage Notes (Signed)
Pt states rectal bleeding for three days. Pt states she is on xarelto. Pt states "i'm just pouring blood". Pt with normal color warm and dry skin with stable vital signs.

## 2019-04-16 NOTE — Plan of Care (Signed)

## 2019-04-16 NOTE — H&P (Signed)
[place] [facility] [place] NAME: Lisa [name]    MR#:  [id number]  DATE OF BIRTH:  1941/03/14  DATE OF ADMISSION:  04/16/2019  PRIMARY CARE PHYSICIAN: Clarisse Gouge, MD   REQUESTING/REFERRING PHYSICIAN: Dr Duffy Bruce  CHIEF COMPLAINT:   Chief Complaint  Patient presents with  . Rectal Bleeding    HISTORY OF PRESENT ILLNESS:  Chirstina [name]  is a 78 y.o. female coming in with blood in the urine and rectal bleeding since Friday.  She is on Eliquis for pulmonary embolism.  This was diagnosed over 6 months ago and her follow-up appointment was pushed back because of the COVID-19.  Patient states that she has had rectal bleeding with dark stools going on for a few days now.  She is also had blood in the urine every time she urinates.  Hospitalist services were contacted for further evaluation.  Patient currently hemodynamically stable and current hemoglobin okay.  PAST MEDICAL HISTORY:   Past Medical History:  Diagnosis Date  . Arthritis   . Asthma   . Cancer (HCC)    anal  . Carotid stenosis   . Colon polyp 05/22/2013  . Foot mass   . Glaucoma (increased eye pressure)   . Headache   . Heart murmur   . Hypercholesterolemia   . Hyperlipidemia   . Irregular heartbeat   . Localized primary osteoarthritis of lower leg   . Macular degeneration   . Mitral valve prolapse   . Neck pain   . Osteopenia   . Sarcoidosis   . Sarcoidosis   . Shortness of breath dyspnea    with exertion  . TIA (transient ischemic attack)     PAST SURGICAL HISTORY:   Past Surgical History:  Procedure Laterality Date  . ABDOMINAL HYSTERECTOMY    . cataract surgery  08/18/09   left eye  . COLONOSCOPY  05/22/2013  . COLONOSCOPY WITH PROPOFOL N/A 09/15/2015   Procedure: COLONOSCOPY WITH PROPOFOL;  Surgeon: Hulen Luster, MD;  Location: [facility] ENDOSCOPY;  Service: Gastroenterology;  Laterality: N/A;  . DILATION AND CURETTAGE OF UTERUS    . EYE SURGERY  Bilateral    cataract extraction  . HEMORRHOID SURGERY N/A 07/24/2015   Procedure: HEMORRHOIDECTOMY/INTERNAL AND EXTERNAL;  Surgeon: Leonie Green, MD;  Location: [facility] ORS;  Service: General;  Laterality: N/A;  . hysterectomy with ovaries intact    . MASS EXCISION N/A 08/21/2015   Procedure: EXCISION MASS/ ANAL MASS;  Surgeon: Leonie Green, MD;  Location: [facility] ORS;  Service: General;  Laterality: N/A;  . TONSILLECTOMY    . TUMOR EXCISION N/A 09/16/2015   Procedure: TUMOR EXCISION RECTAL;  Surgeon: Leonie Green, MD;  Location: [facility] ORS;  Service: General;  Laterality: N/A;    SOCIAL HISTORY:   Social History   Tobacco Use  . Smoking status: Never Smoker  . Smokeless tobacco: Never Used  Substance Use Topics  . Alcohol use: No    FAMILY HISTORY:   Family History  Problem Relation Age of Onset  . Cancer Mother   . CAD Father     DRUG ALLERGIES:   Allergies  Allergen Reactions  . Aspirin Other (See Comments)    Pt can take low dose aspirin with no reaction  . Dairy Aid [Lactase] Other (See Comments)  . Diazepam Other (See Comments)    Affected her hearing and sight  . Shellfish-Derived Products Swelling    REVIEW OF SYSTEMS:  CONSTITUTIONAL: No  fever, fatigue or weakness.  EYES: No blurred or double vision.  EARS, NOSE, AND THROAT: No tinnitus or ear pain. No sore throat RESPIRATORY: No cough, always has some shortness of breath.  No wheezing or hemoptysis.  CARDIOVASCULAR: No chest pain, orthopnea, edema.  GASTROINTESTINAL: No nausea, vomiting.  Some abdominal discomfort on the right side dark blood in the bowel movements GENITOURINARY: No dysuria.  Positive for hematuria.  ENDOCRINE: No polyuria, nocturia,  HEMATOLOGY: No anemia, easy bruising or bleeding SKIN: No rash or lesion. MUSCULOSKELETAL: No joint pain or arthritis.   NEUROLOGIC: No tingling, numbness, weakness.  PSYCHIATRY: No anxiety or depression.   MEDICATIONS AT HOME:   Prior  to Admission medications   Medication Sig Start Date End Date Taking? Authorizing Provider  albuterol (PROAIR HFA) 108 (90 Base) MCG/ACT inhaler Inhale 2 puffs into the lungs every 6 (six) hours as needed for wheezing or shortness of breath. 06/10/16  Yes Tanda Rockers, MD  ALPHAGAN P 0.15 % ophthalmic solution Place 2 drops into the left eye 2 (two) times daily.  06/23/12  Yes [provider]  apixaban (ELIQUIS) 5 MG TABS tablet Take 5 mg by mouth 2 (two) times daily.   Yes [provider]  aspirin EC 81 MG tablet Take 81 mg by mouth daily.   Yes [provider]  beclomethasone (QVAR) 80 MCG/ACT inhaler Inhale 1 puff into the lungs 2 (two) times a day. 04/05/19  Yes [provider]  budesonide (ENTOCORT EC) 3 MG 24 hr capsule Take 9 mg by mouth daily.   Yes [provider]  cholecalciferol (VITAMIN D) 1000 UNITS tablet Take 1,000 Units by mouth daily.    Yes [provider]  furosemide (LASIX) 20 MG tablet Take 40 mg by mouth daily.   Yes [provider]  lidocaine (LIDODERM) 5 % Place 1 patch onto the skin daily as needed. Remove & Discard patch within 12 hours or as directed by MD   Yes [provider]  magnesium oxide (MAG-OX) 400 MG tablet Take 400 mg by mouth daily. 08/06/18  Yes [provider]  Multiple Vitamin (MULTIVITAMIN WITH MINERALS) TABS Take 1 tablet by mouth daily. Centrum Silver   Yes [provider]  Multiple Vitamins-Minerals (PRESERVISION AREDS PO) Take 1 capsule by mouth daily.    Yes [provider]  omeprazole (PRILOSEC) 20 MG capsule Take 20 mg by mouth 2 (two) times a day.   Yes [provider]  potassium chloride (K-DUR) 10 MEQ tablet Take 10-20 mEq by mouth 2 (two) times daily.    Yes [provider]  rosuvastatin (CRESTOR) 5 MG tablet Take 5 mg by mouth every morning.    Yes [provider]  spironolactone (ALDACTONE) 50 MG tablet Take 50 mg by  mouth daily.  08/28/18  Yes [provider]      VITAL SIGNS:  Blood pressure (!) 153/78, pulse 76, temperature 99.1 F (37.3 C), temperature source Oral, resp. rate 16, height 5\' 7"  (1.702 m), weight 76.2 kg, SpO2 96 %.  PHYSICAL EXAMINATION:  GENERAL:  78 y.o.-year-old patient lying in the bed with no acute distress.  EYES: Pupils equal, round, reactive to light and accommodation. No scleral icterus. Extraocular muscles intact.  HEENT: Head atraumatic, normocephalic. Oropharynx and nasopharynx clear.  NECK:  Supple, no jugular venous distention. No thyroid enlargement, no tenderness.  LUNGS: Normal breath sounds bilaterally, no wheezing, rales,rhonchi or crepitation. No use of accessory muscles of respiration.  CARDIOVASCULAR: S1,  S2 normal.  4 out of 6 systolic murmurs, no rubs, or gallops.  ABDOMEN: Soft, slight right-sided abdominal tenderness, nondistended. Bowel sounds present. No organomegaly or mass.  EXTREMITIES: No pedal edema, cyanosis, or clubbing.  NEUROLOGIC: Cranial nerves II through XII are intact. Muscle strength 5/5 in all extremities. Sensation intact. Gait not checked.  PSYCHIATRIC: The patient is alert and oriented x 3.  SKIN: No rash, lesion, or ulcer.   LABORATORY PANEL:   CBC Recent Labs  Lab 04/16/19 0235  WBC 9.8  HGB 13.4  HCT 41.3  PLT 289   ------------------------------------------------------------------------------------------------------------------  Chemistries  Recent Labs  Lab 04/16/19 0235  NA 140  K 4.1  CL 104  CO2 27  GLUCOSE 109*  BUN 22  CREATININE 0.57  CALCIUM 9.2  AST 13*  ALT 19  ALKPHOS 59  BILITOT 0.4   ------------------------------------------------------------------------------------------------------------------   RADIOLOGY:  Ct Abdomen Pelvis W Contrast  Result Date: 04/16/2019 CLINICAL DATA:  Rectal bleeding. EXAM: CT ABDOMEN AND PELVIS WITH CONTRAST TECHNIQUE: Multidetector CT imaging of the  abdomen and pelvis was performed using the standard protocol following bolus administration of intravenous contrast. CONTRAST:  175mL OMNIPAQUE IOHEXOL 300 MG/ML  SOLN COMPARISON:  CT scan of December 16, 2017. FINDINGS: Lower chest: No acute abnormality. Hepatobiliary: No focal liver abnormality is seen. Status post cholecystectomy. No biliary dilatation. Pancreas: Unremarkable. No pancreatic ductal dilatation or surrounding inflammatory changes. Spleen: Normal in size without focal abnormality. Adrenals/Urinary Tract: Adrenal glands are unremarkable. Kidneys are normal, without renal calculi, focal lesion, or hydronephrosis. Bladder is unremarkable. Stomach/Bowel: Stomach is within normal limits. Appendix appears normal. No evidence of bowel wall thickening, distention, or inflammatory changes. Sigmoid diverticulosis is noted without inflammation. Vascular/Lymphatic: No significant vascular findings are present. No enlarged abdominal or pelvic lymph nodes. Reproductive: Status post hysterectomy. No adnexal masses. Other: No abdominal wall hernia or abnormality. No abdominopelvic ascites. Musculoskeletal: No acute or significant osseous findings. IMPRESSION: Sigmoid diverticulosis without inflammation. No acute abnormality seen in the abdomen or pelvis. Electronically Signed   By: Marijo Conception M.D.   On: 04/16/2019 11:29     IMPRESSION AND PLAN:   1.  Blood in the bowel movements with dark stools.  Serial hemoglobins.  Hold Eliquis and aspirin.  But on IV Protonix.  Colonoscopy back in 2016 was negative except for diverticuli.  GI consult for possible endoscopy.  COVID-19 still pending. 2.  Hematuria.  Will have to follow-up with urology as outpatient.  Hopefully bleeding will settle down once Eliquis out of the system. 3.  History of pulmonary embolism.  Since the patient had a CAT scan of the abdomen today I am unable to get a CAT scan of the chest.  We will get an ultrasound of the legs to see if we  can get off the anticoagulation. 4.  Asthma and sarcoidosis respiratory status stable continue usual medications. 5.  Hyperlipidemia continue statin 6.  Hypertension continue usual medications  All the records are reviewed and case discussed with ED provider. Management plans discussed with the patient, and she is in agreement.  Left message for daughter.  CODE STATUS: Full code  TOTAL TIME TAKING CARE OF THIS PATIENT: 50 minutes.    Loletha Grayer M.D on 04/16/2019 at 1:28 PM  Between 7am to 6pm - Pager - [phone]  After 6pm call admission pager [phone]  [facility] Office  [phone]  CC: Primary care physician; Clarisse Gouge, MD

## 2019-04-16 NOTE — Progress Notes (Signed)
Pt informed on tests ordered. ICU nurse to perform rapid COVID test. GI prep initiated. Pt educated on clear liquid diet orders. In agreement with plan of care.

## 2019-04-16 NOTE — ED Provider Notes (Signed)
[facility] Emergency Department Provider Note  ____________________________________________   First MD Initiated Contact with Patient 04/16/19 [phone]     (approximate)  I have reviewed the triage vital signs and the nursing notes.   HISTORY  Chief Complaint Rectal Bleeding    HPI SHAQUETTA ARCOS is a 78 y.o. female with past medical history of anal cancer, hypertension, hyperlipidemia, history of A. fib on Eliquis, here with bright red blood per rectum.  The patient states that over the last several days, every time she has had a bowel movement, it has been bright red blood.  She states it is "liquid" blood coming out of her anus.  She also believes that she may have some blood in her urine, though it is somewhat difficult to tell due to the liquid blood coming from her stool.  She has some associated mild, right greater than left lower abdominal pain.  She has a cramp-like pain.  She denies any specific alleviating or aggravating factors.  She continues to take her Eliquis, last dose was around 9:00 last night.  No history of previous GI bleeds, though she does have a history of anal cancer.  She also has had E. coli is currently on long-term medications for this.  Denies any recent med changes.  No fevers.  No chills.  No dysuria or frequency.  No flank pain.       Past Medical History:  Diagnosis Date   Arthritis    Asthma    Cancer ([place])    anal   Carotid stenosis    Colon polyp 05/22/2013   Foot mass    Glaucoma (increased eye pressure)    Headache    Heart murmur    Hypercholesterolemia    Hyperlipidemia    Irregular heartbeat    Localized primary osteoarthritis of lower leg    Macular degeneration    Mitral valve prolapse    Neck pain    Osteopenia    Sarcoidosis    Sarcoidosis    Shortness of breath dyspnea    with exertion   TIA (transient ischemic attack)     Patient Active Problem List   Diagnosis Date Noted     Asthma without status asthmaticus 09/25/2015   Cancer of anal margin 08/20/2015   Chest pain 07/23/2015   Chronic rhinitis 06/04/2015   Near syncope 09/12/2014   Primary osteoarthritis of both knees 08/16/2014   Muscle strain 05/17/2014   Cephalalgia 04/10/2014   LBP (low back pain) 04/10/2014   Cervical pain 04/10/2014   Decreased potassium in the blood 03/25/2014   Arthritis of knee, degenerative 01/25/2014   Borderline glaucoma, 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/04/2013   Foot mass 06/05/2013   Arthritis of foot, degenerative 06/05/2013   Carotid artery narrowing 03/30/2013   Cardiac murmur 03/07/2013   Persons encountering health services in other specified circumstances 10/26/2012   Airway hyperreactivity 08/25/2012   Hypercholesterolemia 08/25/2012   Degeneration macular 08/25/2012   Carotid stenosis, bilateral 07/21/2012   Idiopathic localized osteoarthropathy 02/17/2012   Asthma, mild persistent 02/14/2012   URI (upper respiratory infection) 11/15/2011   ALLERGY, FOOD 05/20/2010   PULMONARY NODULE, LEFT UPPER LOBE 08/06/2008   Disease of lung 08/06/2008   Respiratory insufficiency 08/06/2008   Sarcoidosis with airway involvement  08/22/2007    Past Surgical History:  Procedure Laterality Date   ABDOMINAL HYSTERECTOMY     cataract surgery  08/18/09   left eye   COLONOSCOPY  05/22/2013  COLONOSCOPY WITH PROPOFOL N/A 09/15/2015   Procedure: COLONOSCOPY WITH PROPOFOL;  Surgeon: Hulen Luster, MD;  Location: [facility] ENDOSCOPY;  Service: Gastroenterology;  Laterality: N/A;   DILATION AND CURETTAGE OF UTERUS     EYE SURGERY Bilateral    cataract extraction   HEMORRHOID SURGERY N/A 07/24/2015   Procedure: HEMORRHOIDECTOMY/INTERNAL AND EXTERNAL;  Surgeon: Leonie Green, MD;  Location: [facility] ORS;  Service: General;  Laterality: N/A;   hysterectomy with ovaries intact     MASS EXCISION N/A 08/21/2015   Procedure: EXCISION MASS/  ANAL MASS;  Surgeon: Leonie Green, MD;  Location: [facility] ORS;  Service: General;  Laterality: N/A;   TONSILLECTOMY     TUMOR EXCISION N/A 09/16/2015   Procedure: TUMOR EXCISION RECTAL;  Surgeon: Leonie Green, MD;  Location: [facility] ORS;  Service: General;  Laterality: N/A;    Prior to Admission medications   Medication Sig Start Date End Date Taking? Authorizing Provider  albuterol (PROAIR HFA) 108 (90 Base) MCG/ACT inhaler Inhale 2 puffs into the lungs every 6 (six) hours as needed for wheezing or shortness of breath. 06/10/16  Yes Tanda Rockers, MD  ALPHAGAN P 0.15 % ophthalmic solution Place 2 drops into the left eye 2 (two) times daily.  06/23/12  Yes [provider]  apixaban (ELIQUIS) 5 MG TABS tablet Take 5 mg by mouth 2 (two) times daily.   Yes [provider]  aspirin EC 81 MG tablet Take 81 mg by mouth daily.   Yes [provider]  beclomethasone (QVAR) 80 MCG/ACT inhaler Inhale 1 puff into the lungs 2 (two) times a day. 04/05/19  Yes [provider]  budesonide (ENTOCORT EC) 3 MG 24 hr capsule Take 9 mg by mouth daily.   Yes [provider]  cholecalciferol (VITAMIN D) 1000 UNITS tablet Take 1,000 Units by mouth daily.    Yes [provider]  furosemide (LASIX) 20 MG tablet Take 40 mg by mouth daily.   Yes [provider]  lidocaine (LIDODERM) 5 % Place 1 patch onto the skin daily as needed. Remove & Discard patch within 12 hours or as directed by MD   Yes [provider]  magnesium oxide (MAG-OX) 400 MG tablet Take 400 mg by mouth daily. 08/06/18  Yes [provider]  Multiple Vitamin (MULTIVITAMIN WITH MINERALS) TABS Take 1 tablet by mouth daily. Centrum Silver   Yes [provider]  Multiple Vitamins-Minerals (PRESERVISION AREDS PO) Take 1 capsule by mouth daily.    Yes [provider]  omeprazole (PRILOSEC) 20 MG capsule Take 20 mg by mouth 2 (two) times a day.   Yes  [provider]  potassium chloride (K-DUR) 10 MEQ tablet Take 10-20 mEq by mouth 2 (two) times daily.    Yes [provider]  rosuvastatin (CRESTOR) 5 MG tablet Take 5 mg by mouth every morning.    Yes [provider]  spironolactone (ALDACTONE) 50 MG tablet Take 50 mg by mouth daily.  08/28/18  Yes [provider]    Allergies Aspirin, Dairy aid [lactase], Diazepam, and Shellfish-derived products  No family history on file.  Social History Social History   Tobacco Use   Smoking status: Never Smoker   Smokeless tobacco: Never Used  Substance Use Topics   Alcohol use: No   Drug use: No    Review of Systems  Review of Systems  Constitutional: Positive for fatigue. Negative for chills and fever.  HENT: Negative for sore throat.  Respiratory: Negative for shortness of breath.   Cardiovascular: Negative for chest pain.  Gastrointestinal: Positive for abdominal pain, blood in stool and rectal pain.  Genitourinary: Positive for hematuria. Negative for flank pain.  Musculoskeletal: Negative for neck pain.  Skin: Negative for rash and wound.  Allergic/Immunologic: Negative for immunocompromised state.  Neurological: Negative for weakness and numbness.  Hematological: Does not bruise/bleed easily.  All other systems reviewed and are negative.    ____________________________________________  PHYSICAL EXAM:      VITAL SIGNS: ED Triage Vitals  Enc Vitals Group     BP 04/16/19 0227 112/85     Pulse Rate 04/16/19 0227 76     Resp 04/16/19 0227 18     Temp 04/16/19 0227 99.1 F (37.3 C)     Temp Source 04/16/19 0227 Oral     SpO2 04/16/19 0227 99 %     Weight 04/16/19 0228 168 lb (76.2 kg)     Height 04/16/19 0228 5\' 7"  (1.702 m)     Head Circumference --      Peak Flow --      Pain Score 04/16/19 0228 0     Pain Loc --      Pain Edu? --      Excl. in [place]? --      Physical Exam Vitals signs and nursing note reviewed.    Constitutional:      General: She is not in acute distress.    Appearance: She is well-developed.  HENT:     Head: Normocephalic and atraumatic.  Eyes:     Conjunctiva/sclera: Conjunctivae normal.  Neck:     Musculoskeletal: Neck supple.  Cardiovascular:     Rate and Rhythm: Normal rate and regular rhythm.     Heart sounds: Normal heart sounds. No murmur. No friction rub.  Pulmonary:     Effort: Pulmonary effort is normal. No respiratory distress.     Breath sounds: Normal breath sounds. No wheezing or rales.  Abdominal:     General: There is no distension.     Palpations: Abdomen is soft.     Tenderness: There is abdominal tenderness (Minimal, lower quadrant bilaterally).  Genitourinary:    Comments: Brown stool in rectal vault.  External, nonthrombosed, nonbleeding hemorrhoid noted.  No vaginal bleeding noted. Skin:    General: Skin is warm.     Capillary Refill: Capillary refill takes less than 2 seconds.  Neurological:     Mental Status: She is alert and oriented to person, place, and time.     Motor: No abnormal muscle tone.       ____________________________________________   LABS (all labs ordered are listed, but only abnormal results are displayed)  Labs Reviewed  COMPREHENSIVE METABOLIC PANEL - Abnormal; Notable for the following components:      Result Value   Glucose, Bld 109 (*)    AST 13 (*)    All other components within normal limits  URINALYSIS, COMPLETE (UACMP) WITH MICROSCOPIC - Abnormal; Notable for the following components:   Color, Urine RED (*)    APPearance HAZY (*)    Glucose, UA   (*)    Value: TEST NOT REPORTED DUE TO COLOR INTERFERENCE OF URINE PIGMENT   Hgb urine dipstick   (*)    Value: TEST NOT REPORTED DUE TO COLOR INTERFERENCE OF URINE PIGMENT   Bilirubin Urine   (*)    Value: TEST NOT REPORTED DUE TO COLOR INTERFERENCE OF URINE PIGMENT   Ketones, ur   (*)  Value: TEST NOT REPORTED DUE TO COLOR INTERFERENCE OF URINE PIGMENT    Protein, ur   (*)    Value: TEST NOT REPORTED DUE TO COLOR INTERFERENCE OF URINE PIGMENT   Nitrite   (*)    Value: TEST NOT REPORTED DUE TO COLOR INTERFERENCE OF URINE PIGMENT   Leukocytes,Ua   (*)    Value: TEST NOT REPORTED DUE TO COLOR INTERFERENCE OF URINE PIGMENT   RBC / HPF >50 (*)    WBC, UA >50 (*)    All other components within normal limits  NOVEL CORONAVIRUS, NAA (HOSPITAL ORDER, SEND-OUT TO REF LAB)  URINE CULTURE  CBC  PROTIME-INR  TYPE AND SCREEN    ____________________________________________  EKG: None ________________________________________  RADIOLOGY All imaging, including plain films, CT scans, and ultrasounds, independently reviewed by me, and interpretations confirmed via formal radiology reads.  ED MD interpretation:   CT AP: Diverticulosis w/o inflammation, no acute complication.  Official radiology report(s): Ct Abdomen Pelvis W Contrast  Result Date: 04/16/2019 CLINICAL DATA:  Rectal bleeding. EXAM: CT ABDOMEN AND PELVIS WITH CONTRAST TECHNIQUE: Multidetector CT imaging of the abdomen and pelvis was performed using the standard protocol following bolus administration of intravenous contrast. CONTRAST:  184mL OMNIPAQUE IOHEXOL 300 MG/ML  SOLN COMPARISON:  CT scan of December 16, 2017. FINDINGS: Lower chest: No acute abnormality. Hepatobiliary: No focal liver abnormality is seen. Status post cholecystectomy. No biliary dilatation. Pancreas: Unremarkable. No pancreatic ductal dilatation or surrounding inflammatory changes. Spleen: Normal in size without focal abnormality. Adrenals/Urinary Tract: Adrenal glands are unremarkable. Kidneys are normal, without renal calculi, focal lesion, or hydronephrosis. Bladder is unremarkable. Stomach/Bowel: Stomach is within normal limits. Appendix appears normal. No evidence of bowel wall thickening, distention, or inflammatory changes. Sigmoid diverticulosis is noted without inflammation. Vascular/Lymphatic: No significant  vascular findings are present. No enlarged abdominal or pelvic lymph nodes. Reproductive: Status post hysterectomy. No adnexal masses. Other: No abdominal wall hernia or abnormality. No abdominopelvic ascites. Musculoskeletal: No acute or significant osseous findings. IMPRESSION: Sigmoid diverticulosis without inflammation. No acute abnormality seen in the abdomen or pelvis. Electronically Signed   By: Marijo Conception M.D.   On: 04/16/2019 11:29    ____________________________________________  PROCEDURES   Procedure(s) performed (including Critical Care):  Procedures  ____________________________________________  INITIAL IMPRESSION / MDM / [place] / ED COURSE  As part of my medical decision making, I reviewed the following data within the electronic MEDICAL RECORD NUMBER Notes from prior ED visits and [place] Controlled Substance Database      *ELDRED LIEVANOS was evaluated in Emergency Department on 04/16/2019 for the symptoms described in the history of present illness. She was evaluated in the context of the global COVID-19 pandemic, which necessitated consideration that the patient might be at risk for infection with the SARS-CoV-2 virus that causes COVID-19. Institutional protocols and algorithms that pertain to the evaluation of patients at risk for COVID-19 are in a state of rapid change based on information released by regulatory bodies including the CDC and federal and state organizations. These policies and algorithms were followed during the patient's care in the ED.  Some ED evaluations and interventions may be delayed as a result of limited staffing during the pandemic.*   Clinical Course as of Apr 15 1141  Mon Apr 16, 2019  0953 78 yo F here with rectal bleeding, question of hematuria. On exam, no BRBPR though reports intermittent maroon-colored and grossly bloody stool, on Eliquis. Given TTP on exam and h/o rectal  CA, will check CT. UA pending. She is s/p hysterectomy, no  signs of vaginal bleeding. While her Hgb is stable here, given age, grossly bloody stool, will likely admit.   [CI]  1127 Interestingly, Hemoccult negative.  She does have an external hemorrhoid that does not appear to be actively bleeding.  Unclear whether she is having intermittent bleeding, or potentially some of her blood is from her urine as she does have gross hematuria.  Possible UTI can be contributing.  Will follow-up CT, plan to admit given age, active bleeding on Eliquis.   [CI]    Clinical Course User Index [CI] Duffy Bruce, MD    Medical Decision Making: 78 yo F here with bloody urine, bloody stool, on Eliquis.  Unclear primary etiology at this time, as she does have hematuria, but is adamant she has had blood in her stool.  CT scan does show uncomplicated diverticulosis, which certainly could cause her lower GI bleed.  Fortunately, hemoglobin is stable.  She is on Eliquis for history of PE.  Will treat for possible UTI given urine findings, plan to monitor hemoglobin.  Case was discussed with Dr. 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  Systemic steroids would not be beneficial.  Will plan to admit for serial hemoglobins and treatment.  ____________________________________________  FINAL CLINICAL IMPRESSION(S) / ED DIAGNOSES  Final diagnoses:  None     MEDICATIONS GIVEN DURING THIS VISIT:  Medications  cefTRIAXone (ROCEPHIN) 2 g in sodium chloride 0.9 % 100 mL IVPB (has no administration in time range)  iohexol (OMNIPAQUE) 300 MG/ML solution 100 mL (100 mLs Intravenous Contrast Given 04/16/19 1102)     ED Discharge Orders    None       Note:  This document was prepared using Dragon voice recognition software and may include unintentional dictation errors.   Duffy Bruce, MD 04/16/19 [phone]

## 2019-04-16 NOTE — Consult Note (Signed)
Lisa Lame, MD [facility]  [address], [place], [place] [address] Phone: [phone] Fax : [phone]  Consultation  Referring Provider:     Dr. Loletha Grayer  Primary Care Physician:  Clarisse Gouge, MD Primary Gastroenterologist:  Gastroenterologist in [place]         Reason for Consultation:     GI bleed  Date of Admission:  04/16/2019 Date of Consultation:  04/16/2019         HPI:   Lisa [name] is a 78 y.o. female Who has a history of lymphocytic colitis and is followed at trial endoscopy and durum.  The patient came with rectal bleeding and she reports blood in her urine.  The patient states that the symptoms started 4 days ago and became worse 3 days ago with a large amount of bleeding until admission.  She reports that the bleeding was much more significant on the day of admission so she came to the hospital.  She lives in [place] but gets her GI care and durum. It appears that her last colonoscopy was in 2019 although those reports are not accessible by me. I am able to see the patient's pathology which shows increased lymphocytes suggestive of lymphocytic colitis. The patient denies any abdominal pain with her rectal bleeding. She also denies that she has had any history of rectal bleeding.  She does not remember ever having a upper endoscopy in the past.The patient had a CT scan that showed:  IMPRESSION: Sigmoid diverticulosis without inflammation.  A venous ultrasound of the lower extremities showed  IMPRESSION: 1. Examination is positive for age-indeterminate nonocclusive DVT involving the proximal and mid aspects of the left femoral vein - while potentially chronic in etiology, in the absence of prior examinations, an acute on chronic process is not excluded. Clinical correlation is advised. 2. No evidence of DVT within the right lower extremity.  The patient has a history of A. Fib and has been on Ellik was.  She also has had a diagnosis of anal  cancer. The patient reports that she has a history of Escherichia coli infection and is on long-term medication for this although she is not sure what the medication is.  She reports the blood to be dark in color but doesn't endorse it being black.  Past Medical History:  Diagnosis Date   Arthritis    Asthma    Cancer ([facility])    anal   Carotid stenosis    Colon polyp 05/22/2013   Foot mass    Glaucoma (increased eye pressure)    Headache    Heart murmur    Hypercholesterolemia    Hyperlipidemia    Irregular heartbeat    Localized primary osteoarthritis of lower leg    Macular degeneration    Mitral valve prolapse    Neck pain    Osteopenia    Sarcoidosis    Sarcoidosis    Shortness of breath dyspnea    with exertion   TIA (transient ischemic attack)     Past Surgical History:  Procedure Laterality Date   ABDOMINAL HYSTERECTOMY     cataract surgery  08/18/09   left eye   COLONOSCOPY  05/22/2013   COLONOSCOPY WITH PROPOFOL N/A 09/15/2015   Procedure: COLONOSCOPY WITH PROPOFOL;  Surgeon: Hulen Luster, MD;  Location: [facility] ENDOSCOPY;  Service: Gastroenterology;  Laterality: N/A;   DILATION AND CURETTAGE OF UTERUS     EYE SURGERY Bilateral    cataract extraction   HEMORRHOID  SURGERY N/A 07/24/2015   Procedure: HEMORRHOIDECTOMY/INTERNAL AND EXTERNAL;  Surgeon: Leonie Green, MD;  Location: [facility];  Service: General;  Laterality: N/A;   hysterectomy with ovaries intact     MASS EXCISION N/A 08/21/2015   Procedure: EXCISION MASS/ ANAL MASS;  Surgeon: Leonie Green, MD;  Location: [facility] ORS;  Service: General;  Laterality: N/A;   TONSILLECTOMY     TUMOR EXCISION N/A 09/16/2015   Procedure: TUMOR EXCISION RECTAL;  Surgeon: Leonie Green, MD;  Location: [facility] ORS;  Service: General;  Laterality: N/A;    Prior to Admission medications   Medication Sig Start Date End Date Taking? Authorizing Provider  albuterol (PROAIR HFA) 108 (90  Base) MCG/ACT inhaler Inhale 2 puffs into the lungs every 6 (six) hours as needed for wheezing or shortness of breath. 06/10/16  Yes Tanda Rockers, MD  ALPHAGAN P 0.15 % ophthalmic solution Place 2 drops into the left eye 2 (two) times daily.  06/23/12  Yes [provider]  apixaban (ELIQUIS) 5 MG TABS tablet Take 5 mg by mouth 2 (two) times daily.   Yes [provider]  aspirin EC 81 MG tablet Take 81 mg by mouth daily.   Yes [provider]  beclomethasone (QVAR) 80 MCG/ACT inhaler Inhale 1 puff into the lungs 2 (two) times a day. 04/05/19  Yes [provider]  budesonide (ENTOCORT EC) 3 MG 24 hr capsule Take 9 mg by mouth daily.   Yes [provider]  cholecalciferol (VITAMIN D) 1000 UNITS tablet Take 1,000 Units by mouth daily.    Yes [provider]  furosemide (LASIX) 20 MG tablet Take 40 mg by mouth daily.   Yes [provider]  lidocaine (LIDODERM) 5 % Place 1 patch onto the skin daily as needed. Remove & Discard patch within 12 hours or as directed by MD   Yes [provider]  magnesium oxide (MAG-OX) 400 MG tablet Take 400 mg by mouth daily. 08/06/18  Yes [provider]  Multiple Vitamin (MULTIVITAMIN WITH MINERALS) TABS Take 1 tablet by mouth daily. Centrum Silver   Yes [provider]  Multiple Vitamins-Minerals (PRESERVISION AREDS PO) Take 1 capsule by mouth daily.    Yes [provider]  omeprazole (PRILOSEC) 20 MG capsule Take 20 mg by mouth 2 (two) times a day.   Yes [provider]  potassium chloride (K-DUR) 10 MEQ tablet Take 10-20 mEq by mouth 2 (two) times daily.    Yes [provider]  rosuvastatin (CRESTOR) 5 MG tablet Take 5 mg by mouth every morning.    Yes [provider]  spironolactone (ALDACTONE) 50 MG tablet Take 50 mg by mouth daily.  08/28/18  Yes [provider]    Family History  Problem Relation Age of Onset   Cancer Mother     CAD Father      Social History   Tobacco Use   Smoking status: Never Smoker   Smokeless tobacco: Never Used  Substance Use Topics   Alcohol use: No   Drug use: No    Allergies as of 04/16/2019 - Review Complete 04/16/2019  Allergen Reaction Noted   Aspirin Other (See Comments)    Dairy aid [lactase] Other (See Comments) 07/15/2015   Diazepam Other (See Comments)    Shellfish-derived products Swelling 07/15/2015    Review of Systems:    All systems reviewed and negative except where noted in HPI.   Physical Exam:  Vital signs in last  24 hours: Temp:  [98.2 F (36.8 C)-99.1 F (37.3 C)] 98.2 F (36.8 C) (06/22 1600) Pulse Rate:  [72-77] 72 (06/22 1600) Resp:  [15-18] 18 (06/22 1600) BP: (112-154)/(68-85) 154/68 (06/22 1600) SpO2:  [96 %-100 %] 100 % (06/22 1600) Weight:  [76.2 kg] 76.2 kg (06/22 0228)   General:   Pleasant, cooperative in NAD Head:  Normocephalic and atraumatic. Eyes:   No icterus.   Conjunctiva pink. PERRLA. Ears:  Normal auditory acuity. Neck:  Supple; no masses or thyroidomegaly Lungs: Respirations even and unlabored. Lungs clear to auscultation bilaterally.   No wheezes, crackles, or rhonchi.  Heart:  Regular rate and rhythm;  Without murmur, clicks, rubs or gallops Abdomen:  Soft, nondistended, nontender. Normal bowel sounds. No appreciable masses or hepatomegaly.  No rebound or guarding.  Rectal:  Not performed. Msk:  Symmetrical without gross deformities.    Extremities:  Without edema, cyanosis or clubbing. Neurologic:  Alert and oriented x3;  grossly normal neurologically. Skin:  Intact without significant lesions or rashes. Cervical Nodes:  No significant cervical adenopathy. Psych:  Alert and cooperative. Normal affect.  LAB RESULTS: Recent Labs    04/16/19 0235  WBC 9.8  HGB 13.4  HCT 41.3  PLT 289   BMET Recent Labs    04/16/19 0235  NA 140  K 4.1  CL 104  CO2 27  GLUCOSE 109*  BUN 22  CREATININE 0.57  CALCIUM  9.2   LFT Recent Labs    04/16/19 0235  PROT 6.7  ALBUMIN 3.9  AST 13*  ALT 19  ALKPHOS 59  BILITOT 0.4   PT/INR Recent Labs    04/16/19 1028  LABPROT 12.9  INR 1.0    STUDIES: Ct Abdomen Pelvis W Contrast  Result Date: 04/16/2019 CLINICAL DATA:  Rectal bleeding. EXAM: CT ABDOMEN AND PELVIS WITH CONTRAST TECHNIQUE: Multidetector CT imaging of the abdomen and pelvis was performed using the standard protocol following bolus administration of intravenous contrast. CONTRAST:  138mL OMNIPAQUE IOHEXOL 300 MG/ML  SOLN COMPARISON:  CT scan of December 16, 2017. FINDINGS: Lower chest: No acute abnormality. Hepatobiliary: No focal liver abnormality is seen. Status post cholecystectomy. No biliary dilatation. Pancreas: Unremarkable. No pancreatic ductal dilatation or surrounding inflammatory changes. Spleen: Normal in size without focal abnormality. Adrenals/Urinary Tract: Adrenal glands are unremarkable. Kidneys are normal, without renal calculi, focal lesion, or hydronephrosis. Bladder is unremarkable. Stomach/Bowel: Stomach is within normal limits. Appendix appears normal. No evidence of bowel wall thickening, distention, or inflammatory changes. Sigmoid diverticulosis is noted without inflammation. Vascular/Lymphatic: No significant vascular findings are present. No enlarged abdominal or pelvic lymph nodes. Reproductive: Status post hysterectomy. No adnexal masses. Other: No abdominal wall hernia or abnormality. No abdominopelvic ascites. Musculoskeletal: No acute or significant osseous findings. IMPRESSION: Sigmoid diverticulosis without inflammation. No acute abnormality seen in the abdomen or pelvis. Electronically Signed   By: Marijo Conception M.D.   On: 04/16/2019 11:29   US Venous Img Lower Bilateral  Result Date: 04/16/2019 CLINICAL DATA:  Bilateral lower extremity edema for the past 6 weeks. History of malignancy. Evaluate for DVT. EXAM: BILATERAL LOWER EXTREMITY VENOUS DOPPLER ULTRASOUND  TECHNIQUE: Gray-scale sonography with graded compression, as well as color Doppler and duplex ultrasound were performed to evaluate the lower extremity deep venous systems from the level of the common femoral vein and including the common femoral, femoral, profunda femoral, popliteal and calf veins including the posterior tibial, peroneal and gastrocnemius veins when visible. The superficial great saphenous vein was also interrogated.  Spectral Doppler was utilized to evaluate flow at rest and with distal augmentation maneuvers in the common femoral, femoral and popliteal veins. COMPARISON:  None. FINDINGS: RIGHT LOWER EXTREMITY Common Femoral Vein: No evidence of thrombus. Normal compressibility, respiratory phasicity and response to augmentation. Saphenofemoral Junction: No evidence of thrombus. Normal compressibility and flow on color Doppler imaging. Profunda Femoral Vein: No evidence of thrombus. Normal compressibility and flow on color Doppler imaging. Femoral Vein: No evidence of thrombus. Normal compressibility, respiratory phasicity and response to augmentation. Popliteal Vein: No evidence of thrombus. Normal compressibility, respiratory phasicity and response to augmentation. Calf Veins: No evidence of thrombus. Normal compressibility and flow on color Doppler imaging. Superficial Great Saphenous Vein: No evidence of thrombus. Normal compressibility. Other Findings:  None. LEFT LOWER EXTREMITY Common Femoral Vein: No evidence of thrombus. Normal compressibility, respiratory phasicity and response to augmentation. Saphenofemoral Junction: No evidence of thrombus. Normal compressibility and flow on color Doppler imaging. Profunda Femoral Vein: No evidence of thrombus. Normal compressibility and flow on color Doppler imaging. Femoral Vein: There is age-indeterminate hypoechoic nonocclusive thrombus within the proximal (images 13 and 14) and mid (images 17 and 18) aspects of the left femoral vein. The distal  aspect the left femoral vein appears widely patent. Popliteal Vein: No evidence of thrombus. Normal compressibility, respiratory phasicity and response to augmentation. Calf Veins: No evidence of thrombus. Normal compressibility and flow on color Doppler imaging. Superficial Great Saphenous Vein: No evidence of thrombus. Normal compressibility. Other Findings:  None. IMPRESSION: 1. Examination is positive for age-indeterminate nonocclusive DVT involving the proximal and mid aspects of the left femoral vein - while potentially chronic in etiology, in the absence of prior examinations, an acute on chronic process is not excluded. Clinical correlation is advised. 2. No evidence of DVT within the right lower extremity. Electronically Signed   By: Sandi Mariscal M.D.   On: 04/16/2019 15:11      Impression / Plan:   Assessment: Active Problems:   GI bleed   Lisa [name] is a 78 y.o. y/o female with Hematuria and a GI bleed.  The patient reports the stools to be dark in color.  She has not had any history of a lower GI bleed in the past.  The patient has a diagnosis of lymphocytic colitis.   Plan:  The patient has been recommended to undergo an EGD and colonoscopy due to her rectal bleeding.  It is unlikely due to her lymphocytic colitis since this is a microscopic colitis and not a breakdown of the mucosa of the colon.  The patient had a Covid 19 test sent off but may take a few days to get back.  The patient's hemoglobin is stable but the patient has a DVT and will need to continue anticoagulation.  I've contacted the hospitalist to will try and get a rapid Covid test on this patient so we can proceed with her luminal evaluation.  The patient has been explained the plan and agrees with it.  Thank you for involving me in the care of this patient.      LOS: 0 days   Lisa Lame, MD  04/16/2019, 5:15 PM    Note: This dictation was prepared with Dragon dictation along with smaller phrase technology.  Any transcriptional errors that result from this process are unintentional.

## 2019-04-16 NOTE — Progress Notes (Signed)
Patient ID: Lisa [name], female   DOB: Nov 27, 1940, 78 y.o.   MRN: [id number]  Unfortunately the rapid COVID test was not sent.  Patient cannot have a endoscopy until the results are back.  Case discussed with Dr. Allen Norris gastroenterology.  We will send off another hemoglobin now.  Check hemoglobin in the morning.  I ordered a rapid COVID test.  The patient has a DVT in the right lower extremity and likely will end up needing anticoagulation versus a filter depending on results of the gastrointestinal procedure.  So an endoscopy should be done prior to discharge.  Dr Loletha Grayer

## 2019-04-17 ENCOUNTER — Encounter
Admission: EM | Disposition: A | Discharge status: Home / Self Care | Payer: Self-pay | Source: Home / Self Care | Attending: Internal Medicine

## 2019-04-17 ENCOUNTER — Inpatient Hospital Stay: Payer: Medicare Other | Admitting: Anesthesiology

## 2019-04-17 ENCOUNTER — Encounter: Payer: Self-pay | Admitting: Anesthesiology

## 2019-04-17 DIAGNOSIS — Z79899 Other long term (current) drug therapy: Secondary | ICD-10-CM

## 2019-04-17 DIAGNOSIS — Z7982 Long term (current) use of aspirin: Secondary | ICD-10-CM

## 2019-04-17 DIAGNOSIS — K921 Melena: Secondary | ICD-10-CM

## 2019-04-17 DIAGNOSIS — Z7901 Long term (current) use of anticoagulants: Secondary | ICD-10-CM

## 2019-04-17 DIAGNOSIS — K625 Hemorrhage of anus and rectum: Secondary | ICD-10-CM

## 2019-04-17 DIAGNOSIS — I82412 Acute embolism and thrombosis of left femoral vein: Secondary | ICD-10-CM

## 2019-04-17 DIAGNOSIS — I10 Essential (primary) hypertension: Secondary | ICD-10-CM

## 2019-04-17 DIAGNOSIS — K2970 Gastritis, unspecified, without bleeding: Secondary | ICD-10-CM

## 2019-04-17 DIAGNOSIS — D123 Benign neoplasm of transverse colon: Secondary | ICD-10-CM

## 2019-04-17 DIAGNOSIS — K635 Polyp of colon: Secondary | ICD-10-CM

## 2019-04-17 DIAGNOSIS — E785 Hyperlipidemia, unspecified: Secondary | ICD-10-CM

## 2019-04-17 DIAGNOSIS — K621 Rectal polyp: Secondary | ICD-10-CM

## 2019-04-17 HISTORY — PX: COLONOSCOPY WITH PROPOFOL: SHX5780

## 2019-04-17 HISTORY — PX: ESOPHAGOGASTRODUODENOSCOPY (EGD) WITH PROPOFOL: SHX5813

## 2019-04-17 LAB — NOVEL CORONAVIRUS, NAA (HOSP ORDER, SEND-OUT TO REF LAB; TAT 18-24 HRS): SARS-CoV-2, NAA: NOT DETECTED

## 2019-04-17 LAB — URINE CULTURE
Culture: <10000 — AB
Special Requests: NORMAL

## 2019-04-17 LAB — BASIC METABOLIC PANEL
Anion gap: 7 (ref 5–15)
BUN: 13 mg/dL (ref 8–23)
CO2: 28 mmol/L (ref 22–32)
Calcium: 8.7 mg/dL — ABNORMAL LOW (ref 8.9–10.3)
Chloride: 108 mmol/L (ref 98–111)
Creatinine, Ser: 0.37 mg/dL — ABNORMAL LOW (ref 0.44–1.00)
GFR calc Af Amer: >60 mL/min (ref >60)
GFR calc non Af Amer: >60 mL/min (ref >60)
Glucose, Bld: 94 mg/dL (ref 70–99)
Potassium: 3.8 mmol/L (ref 3.5–5.1)
Sodium: 143 mmol/L (ref 135–145)

## 2019-04-17 LAB — CBC
HCT: 35.1 % — ABNORMAL LOW (ref 36.0–46.0)
Hemoglobin: 11.2 g/dL — ABNORMAL LOW (ref 12.0–15.0)
MCH: 31.6 pg (ref 26.0–34.0)
MCHC: 31.9 g/dL (ref 30.0–36.0)
MCV: 99.2 fL (ref 80.0–100.0)
Platelets: 259 10*3/uL (ref 150–400)
RBC: 3.54 MIL/uL — ABNORMAL LOW (ref 3.87–5.11)
RDW: 12.9 % (ref 11.5–15.5)
WBC: 8.3 10*3/uL (ref 4.0–10.5)
nRBC: 0 % (ref 0.0–0.2)

## 2019-04-17 SURGERY — ESOPHAGOGASTRODUODENOSCOPY (EGD) WITH PROPOFOL
Anesthesia: General

## 2019-04-17 MED ORDER — PROPOFOL 10 MG/ML IV BOLUS
INTRAVENOUS | Status: DC | PRN
  Administered 2019-04-17 (×2): 40 mg via INTRAVENOUS

## 2019-04-17 MED ORDER — PHENYLEPHRINE HCL (PRESSORS) 10 MG/ML IV SOLN
INTRAVENOUS | Status: DC | PRN
  Administered 2019-04-17: 80 ug via INTRAVENOUS

## 2019-04-17 MED ORDER — CALCIUM CARBONATE ANTACID 500 MG PO CHEW
1.0000 | CHEWABLE_TABLET | Freq: Every day | ORAL | Status: DC | PRN
  Administered 2019-04-17: 200 mg via ORAL
  Filled 2019-04-17: qty 1

## 2019-04-17 MED ORDER — LIDOCAINE HCL (CARDIAC) PF 100 MG/5ML IV SOSY
PREFILLED_SYRINGE | INTRAVENOUS | Status: DC | PRN
  Administered 2019-04-17: 50 mg via INTRAVENOUS

## 2019-04-17 MED ORDER — PROPOFOL 500 MG/50ML IV EMUL
INTRAVENOUS | Status: DC | PRN
  Administered 2019-04-17: 150 ug/kg/min via INTRAVENOUS

## 2019-04-17 MED ORDER — PROPOFOL 10 MG/ML IV BOLUS
INTRAVENOUS | Status: AC
  Filled 2019-04-17: qty 40

## 2019-04-17 NOTE — Progress Notes (Signed)
[facility] NAME: Lisa [name]    MR#:  [id number]  DATE OF BIRTH:  August 03, 1941  SUBJECTIVE:  CHIEF COMPLAINT:   Chief Complaint  Patient presents with  . Rectal Bleeding   -Left leg pain.  No further GI bleed.  For EGD and colonoscopy today.  Hemoglobin is stable  REVIEW OF SYSTEMS:  Review of Systems  Constitutional: Positive for malaise/fatigue. Negative for chills and fever.  HENT: Negative for congestion, ear discharge, hearing loss and nosebleeds.   Eyes: Negative for blurred vision and double vision.  Respiratory: Negative for cough, shortness of breath and wheezing.   Cardiovascular: Positive for leg swelling. Negative for chest pain and palpitations.  Gastrointestinal: Positive for blood in stool. Negative for abdominal pain, constipation, diarrhea, nausea and vomiting.  Genitourinary: Negative for dysuria.  Musculoskeletal: Negative for myalgias.  Neurological: Negative for dizziness, focal weakness, seizures, weakness and headaches.  Psychiatric/Behavioral: Negative for depression.    DRUG ALLERGIES:   Allergies  Allergen Reactions  . Aspirin Other (See Comments)    Pt can take low dose aspirin with no reaction  . Dairy Aid [Lactase] Other (See Comments)  . Diazepam Other (See Comments)    Affected her hearing and sight  . Shellfish-Derived Products Swelling    VITALS:  Blood pressure 140/63, pulse 71, temperature (!) 96.8 F (36 C), temperature source Tympanic, resp. rate 16, height 5\' 7"  (1.702 m), weight 76.2 kg, SpO2 100 %.  PHYSICAL EXAMINATION:  Physical Exam   GENERAL:  78 y.o.-year-old patient lying in the bed with no acute distress.  EYES: Pupils equal, round, reactive to light and accommodation. No scleral icterus. Extraocular muscles intact.  HEENT: Head atraumatic, normocephalic. Oropharynx and nasopharynx clear.  NECK:  Supple, no jugular venous distention. No thyroid enlargement, no  tenderness.  LUNGS: Normal breath sounds bilaterally, no wheezing, rales,rhonchi or crepitation. No use of accessory muscles of respiration.  CARDIOVASCULAR: S1, S2 normal. No  rubs, or gallops.  Loud 3/6 systolic murmur is present ABDOMEN: Soft, nontender, nondistended. Bowel sounds present. No organomegaly or mass.  EXTREMITIES: No pedal edema, cyanosis, or clubbing.  NEUROLOGIC: Cranial nerves II through XII are intact. Muscle strength 5/5 in all extremities. Sensation intact. Gait not checked.  PSYCHIATRIC: The patient is alert and oriented x 3.  SKIN: No obvious rash, lesion, or ulcer.    LABORATORY PANEL:   CBC Recent Labs  Lab 04/17/19 0424  WBC 8.3  HGB 11.2*  HCT 35.1*  PLT 259   ------------------------------------------------------------------------------------------------------------------  Chemistries  Recent Labs  Lab 04/16/19 0235 04/17/19 0424  NA 140 143  K 4.1 3.8  CL 104 108  CO2 27 28  GLUCOSE 109* 94  BUN 22 13  CREATININE 0.57 0.37*  CALCIUM 9.2 8.7*  AST 13*  --   ALT 19  --   ALKPHOS 59  --   BILITOT 0.4  --    ------------------------------------------------------------------------------------------------------------------  Cardiac Enzymes No results for input(s): TROPONINI in the last 168 hours. ------------------------------------------------------------------------------------------------------------------  RADIOLOGY:  Ct Abdomen Pelvis W Contrast  Result Date: 04/16/2019 CLINICAL DATA:  Rectal bleeding. EXAM: CT ABDOMEN AND PELVIS WITH CONTRAST TECHNIQUE: Multidetector CT imaging of the abdomen and pelvis was performed using the standard protocol following bolus administration of intravenous contrast. CONTRAST:  127mL OMNIPAQUE IOHEXOL 300 MG/ML  SOLN COMPARISON:  CT scan of December 16, 2017. FINDINGS: Lower chest: No acute abnormality. Hepatobiliary: No focal liver abnormality is seen. Status post cholecystectomy.  No biliary dilatation.  Pancreas: Unremarkable. No pancreatic ductal dilatation or surrounding inflammatory changes. Spleen: Normal in size without focal abnormality. Adrenals/Urinary Tract: Adrenal glands are unremarkable. Kidneys are normal, without renal calculi, focal lesion, or hydronephrosis. Bladder is unremarkable. Stomach/Bowel: Stomach is within normal limits. Appendix appears normal. No evidence of bowel wall thickening, distention, or inflammatory changes. Sigmoid diverticulosis is noted without inflammation. Vascular/Lymphatic: No significant vascular findings are present. No enlarged abdominal or pelvic lymph nodes. Reproductive: Status post hysterectomy. No adnexal masses. Other: No abdominal wall hernia or abnormality. No abdominopelvic ascites. Musculoskeletal: No acute or significant osseous findings. IMPRESSION: Sigmoid diverticulosis without inflammation. No acute abnormality seen in the abdomen or pelvis. Electronically Signed   By: Marijo Conception M.D.   On: 04/16/2019 11:29   US Venous Img Lower Bilateral  Result Date: 04/16/2019 CLINICAL DATA:  Bilateral lower extremity edema for the past 6 weeks. History of malignancy. Evaluate for DVT. EXAM: BILATERAL LOWER EXTREMITY VENOUS DOPPLER ULTRASOUND TECHNIQUE: Gray-scale sonography with graded compression, as well as color Doppler and duplex ultrasound were performed to evaluate the lower extremity deep venous systems from the level of the common femoral vein and including the common femoral, femoral, profunda femoral, popliteal and calf veins including the posterior tibial, peroneal and gastrocnemius veins when visible. The superficial great saphenous vein was also interrogated. Spectral Doppler was utilized to evaluate flow at rest and with distal augmentation maneuvers in the common femoral, femoral and popliteal veins. COMPARISON:  None. FINDINGS: RIGHT LOWER EXTREMITY Common Femoral Vein: No evidence of thrombus. Normal compressibility, respiratory phasicity and  response to augmentation. Saphenofemoral Junction: No evidence of thrombus. Normal compressibility and flow on color Doppler imaging. Profunda Femoral Vein: No evidence of thrombus. Normal compressibility and flow on color Doppler imaging. Femoral Vein: No evidence of thrombus. Normal compressibility, respiratory phasicity and response to augmentation. Popliteal Vein: No evidence of thrombus. Normal compressibility, respiratory phasicity and response to augmentation. Calf Veins: No evidence of thrombus. Normal compressibility and flow on color Doppler imaging. Superficial Great Saphenous Vein: No evidence of thrombus. Normal compressibility. Other Findings:  None. LEFT LOWER EXTREMITY Common Femoral Vein: No evidence of thrombus. Normal compressibility, respiratory phasicity and response to augmentation. Saphenofemoral Junction: No evidence of thrombus. Normal compressibility and flow on color Doppler imaging. Profunda Femoral Vein: No evidence of thrombus. Normal compressibility and flow on color Doppler imaging. Femoral Vein: There is age-indeterminate hypoechoic nonocclusive thrombus within the proximal (images 13 and 14) and mid (images 17 and 18) aspects of the left femoral vein. The distal aspect the left femoral vein appears widely patent. Popliteal Vein: No evidence of thrombus. Normal compressibility, respiratory phasicity and response to augmentation. Calf Veins: No evidence of thrombus. Normal compressibility and flow on color Doppler imaging. Superficial Great Saphenous Vein: No evidence of thrombus. Normal compressibility. Other Findings:  None. IMPRESSION: 1. Examination is positive for age-indeterminate nonocclusive DVT involving the proximal and mid aspects of the left femoral vein - while potentially chronic in etiology, in the absence of prior examinations, an acute on chronic process is not excluded. Clinical correlation is advised. 2. No evidence of DVT within the right lower extremity.  Electronically Signed   By: Sandi Mariscal M.D.   On: 04/16/2019 15:11    EKG:   Orders placed or performed during the hospital encounter of 04/16/19  . EKG 12-Lead  . EKG 12-Lead    ASSESSMENT AND PLAN:   78 year old female with past medical history significant for pulmonary sarcoidosis, pulmonary hypertension, mitral  valve prolapse, TIA, diagnosis of pulmonary embolism through a positive VQ scan and on Eliquis presents to hospital from home secondary to rectal bleeding and weakness.  1.  Rectal bleeding-hemoglobin has remained stable.  Patient is on Eliquis and aspirin both of which have  been held. -GI consult appreciated.  COVID-19 is negative -Patient to go for EGD and colonoscopy today.  Remains n.p.o.  No further active bleeding noted  2.  Left lower extremity DVT-has some swelling and pain along the left leg.  Ultrasound of the lower extremities showing age-indeterminate clot in the left leg and negative DVT in the right leg.  Patient was diagnosed with pulmonary embolism based on a high probability VQ scan in December 2019 at [facility] and was started on Eliquis at the time.  No Dopplers were done to compare. -A CT angiogram done in February 2020 did not show any pulmonary embolism.  Patient has been taking her Eliquis. -Given her GI bleed, Eliquis has been held.  Will discuss with vascular if she will need any intervention for the left lower extremity or IVC filter.  If not if this is a chronic clot, will resume her Eliquis at discharge.  3.  History of pulmonary sarcoidosis and pulmonary hypertension-continue home medications and outpatient follow-up as prior. Patient on low-dose Lasix.  4.  GERD-Protonix  5.  DVT prophylaxis-currently on hold given GI bleed  Very independent at baseline    All the records are reviewed and case discussed with Care Management/Social Workerr. Management plans discussed with the patient, family and they are in agreement.  CODE STATUS: Full Code   TOTAL TIME TAKING CARE OF THIS PATIENT: 38 minutes.   POSSIBLE D/C IN 1-2 DAYS, DEPENDING ON CLINICAL CONDITION.   Gladstone Lighter M.D on 04/17/2019 at 10:54 AM  Between 7am to 6pm - Pager - [phone]  After 6pm go to [email or web address] - password [email or web address] [facility]  Office  [phone]  CC: Primary care physician; Clarisse Gouge, MD

## 2019-04-17 NOTE — Anesthesia Post-op Follow-up Note (Signed)
Anesthesia QCDR form completed.        

## 2019-04-17 NOTE — Op Note (Signed)
[facility] Gastroenterology Patient Name: Lisa [name] Procedure Date: 04/17/2019 11:20 AM MRN: [id number] Account #: [id number] Date of Birth: March 30, 1941 Admit Type: Inpatient Age: 78 Room: [facility] ENDO ROOM 4 Gender: Female Note Status: Finalized Procedure:            Colonoscopy Indications:          Hematochezia Providers:            Lucilla Lame MD, MD Medicines:            Propofol per Anesthesia Complications:        No immediate complications. Procedure:            Pre-Anesthesia Assessment:                       - Prior to the procedure, a History and Physical was                        performed, and patient medications and allergies were                        reviewed. The patient's tolerance of previous                        anesthesia was also reviewed. The risks and benefits of                        the procedure and the sedation options and risks were                        discussed with the patient. All questions were                        answered, and informed consent was obtained. Prior                        Anticoagulants: The patient has taken no previous                        anticoagulant or antiplatelet agents. ASA Grade                        Assessment: II - A patient with mild systemic disease.                        After reviewing the risks and benefits, the patient was                        deemed in satisfactory condition to undergo the                        procedure.                       After obtaining informed consent, the colonoscope was                        passed under direct vision. Throughout the procedure,                        the patient's blood pressure, pulse, and oxygen  saturations were monitored continuously. The                        Colonoscope was introduced through the anus and                        advanced to the the cecum, identified by appendiceal   orifice and ileocecal valve. The colonoscopy was                        performed without difficulty. The patient tolerated the                        procedure well. The quality of the bowel preparation                        was fair. Findings:      The perianal and digital rectal examinations were normal.      Many small-mouthed diverticula were found in the entire colon.      A 3 mm polyp was found in the transverse colon. The polyp was sessile.       The polyp was removed with a cold biopsy forceps. Resection and       retrieval were complete.      A 4 mm polyp was found in the rectum. The polyp was sessile. The polyp       was removed with a cold biopsy forceps. Resection and retrieval were       complete.      Non-bleeding internal hemorrhoids were found during retroflexion. The       hemorrhoids were Grade II (internal hemorrhoids that prolapse but reduce       spontaneously). Impression:           - Preparation of the colon was fair.                       - Diverticulosis in the entire examined colon.                       - One 3 mm polyp in the transverse colon, removed with                        a cold biopsy forceps. Resected and retrieved.                       - One 4 mm polyp in the rectum, removed with a cold                        biopsy forceps. Resected and retrieved.                       - Non-bleeding internal hemorrhoids. Recommendation:       - Return patient to hospital ward for ongoing care.                       - Resume previous diet.                       - Continue present medications. Procedure Code(s):    --- Professional ---  45380, Colonoscopy, flexible; with biopsy, single or                        multiple Diagnosis Code(s):    --- Professional ---                       K92.1, Melena (includes Hematochezia)                       K63.5, Polyp of colon                       K62.1, Rectal polyp CPT copyright 2019 American  Medical Association. All rights reserved. The codes documented in this report are preliminary and upon coder review may  be revised to meet current compliance requirements. Lucilla Lame MD, MD 04/17/2019 11:52:41 AM This report has been signed electronically. Number of Addenda: 0 Note Initiated On: 04/17/2019 11:20 AM Scope Withdrawal Time: 0 hours 8 minutes 23 seconds  Total Procedure Duration: 0 hours 12 minutes 53 seconds  Estimated Blood Loss: Estimated blood loss: none.      [facility]

## 2019-04-17 NOTE — Op Note (Signed)
[facility] Gastroenterology Patient Name: Lisa [name] Procedure Date: 04/17/2019 11:21 AM MRN: [id number] Account #: [id number] Date of Birth: 31-Oct-1940 Admit Type: Inpatient Age: 78 Room: [facility] ENDO ROOM 4 Gender: Female Note Status: Finalized Procedure:            Upper GI endoscopy Indications:          Hematochezia Providers:            Lucilla Lame MD, MD Medicines:            Propofol per Anesthesia Complications:        No immediate complications. Procedure:            Pre-Anesthesia Assessment:                       - Prior to the procedure, a History and Physical was                        performed, and patient medications and allergies were                        reviewed. The patient's tolerance of previous                        anesthesia was also reviewed. The risks and benefits of                        the procedure and the sedation options and risks were                        discussed with the patient. All questions were                        answered, and informed consent was obtained. Prior                        Anticoagulants: The patient has taken no previous                        anticoagulant or antiplatelet agents. ASA Grade                        Assessment: II - A patient with mild systemic disease.                        After reviewing the risks and benefits, the patient was                        deemed in satisfactory condition to undergo the                        procedure.                       After obtaining informed consent, the endoscope was                        passed under direct vision. Throughout the procedure,                        the patient's blood pressure, pulse, and  oxygen                        saturations were monitored continuously. The Endoscope                        was introduced through the mouth, and advanced to the                        second part of duodenum. The upper GI endoscopy was                  accomplished without difficulty. The patient tolerated                        the procedure well. Findings:      A small hiatal hernia was present.      Localized moderate inflammation characterized by erosions was found in       the gastric antrum. Biopsies were taken with a cold forceps for       histology.      The examined duodenum was normal. Impression:           - Small hiatal hernia.                       - Gastritis. Biopsied.                       - Normal examined duodenum. Recommendation:       - Return patient to hospital ward for ongoing care.                       - Resume previous diet.                       - Continue present medications.                       - Perform a colonoscopy today. Procedure Code(s):    --- Professional ---                       [phone], Esophagogastroduodenoscopy, flexible, transoral;                        with biopsy, single or multiple Diagnosis Code(s):    --- Professional ---                       K92.1, Melena (includes Hematochezia)                       K29.70, Gastritis, unspecified, without bleeding CPT copyright 2019 American Medical Association. All rights reserved. The codes documented in this report are preliminary and upon coder review may  be revised to meet current compliance requirements. Lucilla Lame MD, MD 04/17/2019 11:35:21 AM This report has been signed electronically. Number of Addenda: 0 Note Initiated On: 04/17/2019 11:21 AM Estimated Blood Loss: Estimated blood loss: none.      [facility]

## 2019-04-17 NOTE — Consult Note (Signed)
Lisa [name] Consult Note  MRN : [id number]  Lisa [name] is a 78 y.o. (1940/11/22) female who presents with chief complaint of  Chief Complaint  Patient presents with  . Rectal Bleeding   History of Present Illness:  The patient is an 78 year old female with a past medical history of TIA, sarcoidosis, osteopenia, mitral valve prolapse, macular degeneration, osteoarthritis of lower extremity, hyperlipidemia, hypercholesteremia, heart murmur, glaucoma, carotid stenosis, anal cancer, asthma, recent history of DVT on Eliquis who presented to the [facility] yesterday with a chief complaint of "rectal bleeding".  The patient endorses a history of multiple bowel movements containing bright red blood x3 days.  The patient does note she takes Eliquis.  She states some mild abdominal pain which she describes as "cramp-like".  Denies any fever, nausea or vomiting.  She denies any shortness of breath or chest pain.  She did not notes being diagnosed with DVT/PE approximately 6 months ago.  Patient was taken to the endoscopy suite and underwent an endoscopy and colonoscopy today.  Patient found to have diverticulosis in the entire colon, two polyps removed, nonbleeding internal hemorrhoids, small hiatal hernia with gastritis.   04/16/19 - Venous Duplex:  1. Examination is positive for age-indeterminate nonocclusive DVT involving the proximal and mid aspects of the left femoral vein - while potentially chronic in etiology, in the absence of prior examinations, an acute on chronic process is not excluded. 2. No evidence of DVT within the right lower extremity.  [name] surgery was consulted by Dr. Tressia Miners for further recommendations. Current Facility-Administered Medications  Medication Dose Route Frequency Provider Last Rate Last Dose  . 0.9 %  sodium chloride infusion   Intravenous Continuous Lucilla Lame, MD 50 mL/hr at 04/17/19 0630    .  acetaminophen (TYLENOL) tablet 650 mg  650 mg Oral Q6H PRN Lucilla Lame, MD       Or  . acetaminophen (TYLENOL) suppository 650 mg  650 mg Rectal Q6H PRN Lucilla Lame, MD      . albuterol (PROVENTIL) (2.5 MG/3ML) 0.083% nebulizer solution 2.5 mg  2.5 mg Inhalation Q6H PRN Lucilla Lame, MD      . brimonidine (ALPHAGAN) 0.15 % ophthalmic solution 2 drop  2 drop Left Eye BID Lucilla Lame, MD   2 drop at 04/16/19 2028  . budesonide (ENTOCORT EC) 24 hr capsule 9 mg  9 mg Oral Daily Lucilla Lame, MD   9 mg at 04/16/19 1605  . budesonide (PULMICORT) nebulizer solution 0.25 mg  0.25 mg Nebulization BID Lucilla Lame, MD   0.25 mg at 04/17/19 0756  . cefTRIAXone (ROCEPHIN) 1 g in sodium chloride 0.9 % 100 mL IVPB  1 g Intravenous Q24H Lucilla Lame, MD      . cholecalciferol (VITAMIN D3) tablet 1,000 Units  1,000 Units Oral Daily Allen Norris, Darren, MD      . furosemide (LASIX) tablet 40 mg  40 mg Oral Daily Wohl, Darren, MD      . lidocaine (LIDODERM) 5 % 1 patch  1 patch Transdermal Daily PRN Lucilla Lame, MD      . magnesium oxide (MAG-OX) tablet 400 mg  400 mg Oral Daily Lucilla Lame, MD   400 mg at 04/16/19 1605  . multivitamin-lutein (OCUVITE-LUTEIN) capsule 1 capsule  1 capsule Oral Daily Lucilla Lame, MD   1 capsule at 04/16/19 1605  . ondansetron (ZOFRAN) tablet 4 mg  4 mg Oral Q6H PRN Lucilla Lame, MD  Or  . ondansetron (ZOFRAN) injection 4 mg  4 mg Intravenous Q6H PRN Lucilla Lame, MD      . pantoprazole (PROTONIX) injection 40 mg  40 mg Intravenous Q12H Lucilla Lame, MD   40 mg at 04/16/19 2028  . potassium chloride (K-DUR) CR tablet 10 mEq  10 mEq Oral Daily Lucilla Lame, MD   10 mEq at 04/16/19 1614  . rosuvastatin (CRESTOR) tablet 5 mg  5 mg Oral Barbaraann Share, Darren, MD      . spironolactone (ALDACTONE) tablet 50 mg  50 mg Oral Daily Lucilla Lame, MD   50 mg at 04/16/19 1605   Past Medical History:  Diagnosis Date  . Arthritis   . Asthma   . Cancer (HCC)    anal  . Carotid stenosis   .  Colon polyp 05/22/2013  . Foot mass   . Glaucoma (increased eye pressure)   . Headache   . Heart murmur   . Hypercholesterolemia   . Hyperlipidemia   . Irregular heartbeat   . Localized primary osteoarthritis of lower leg   . Macular degeneration   . Mitral valve prolapse   . Neck pain   . Osteopenia   . Sarcoidosis   . Sarcoidosis   . Shortness of breath dyspnea    with exertion  . TIA (transient ischemic attack)    Past Surgical History:  Procedure Laterality Date  . ABDOMINAL HYSTERECTOMY    . cataract surgery  08/18/09   left eye  . COLONOSCOPY  05/22/2013  . COLONOSCOPY WITH PROPOFOL N/A 09/15/2015   Procedure: COLONOSCOPY WITH PROPOFOL;  Surgeon: Hulen Luster, MD;  Location: [facility] ENDOSCOPY;  Service: Gastroenterology;  Laterality: N/A;  . DILATION AND CURETTAGE OF UTERUS    . EYE SURGERY Bilateral    cataract extraction  . HEMORRHOID SURGERY N/A 07/24/2015   Procedure: HEMORRHOIDECTOMY/INTERNAL AND EXTERNAL;  Surgeon: Leonie Green, MD;  Location: [facility] ORS;  Service: General;  Laterality: N/A;  . hysterectomy with ovaries intact    . MASS EXCISION N/A 08/21/2015   Procedure: EXCISION MASS/ ANAL MASS;  Surgeon: Leonie Green, MD;  Location: [facility] ORS;  Service: General;  Laterality: N/A;  . TONSILLECTOMY    . TUMOR EXCISION N/A 09/16/2015   Procedure: TUMOR EXCISION RECTAL;  Surgeon: Leonie Green, MD;  Location: [facility] ORS;  Service: General;  Laterality: N/A;   Social History Social History   Tobacco Use  . Smoking status: Never Smoker  . Smokeless tobacco: Never Used  Substance Use Topics  . Alcohol use: No  . Drug use: No   Family History Family History  Problem Relation Age of Onset  . Cancer Mother   . CAD Father   Denies family history of peripheral artery disease, venous disease and/or bleeding/clotting disorders.  Allergies  Allergen Reactions  . Aspirin Other (See Comments)    Pt can take low dose aspirin with no reaction  . Dairy  Aid [Lactase] Other (See Comments)  . Diazepam Other (See Comments)    Affected her hearing and sight  . Shellfish-Derived Products Swelling   REVIEW OF SYSTEMS (Negative unless checked)  Constitutional: [] Weight loss  [] Fever  [] Chills Cardiac: [] Chest pain   [] Chest pressure   [] Palpitations   [] Shortness of breath when laying flat   [] Shortness of breath at rest   [] Shortness of breath with exertion. [name]:  [] Pain in legs with walking   [] Pain in legs at rest   [] Pain in legs when laying flat   []   Claudication   [] Pain in feet when walking  [] Pain in feet at rest  [] Pain in feet when laying flat   [x] History of DVT   [] Phlebitis   [] Swelling in legs   [] Varicose veins   [] Non-healing ulcers Pulmonary:   [] Uses home oxygen   [] Productive cough   [] Hemoptysis   [] Wheeze  [] COPD   [] Asthma Neurologic:  [] Dizziness  [] Blackouts   [] Seizures   [] History of stroke   [] History of TIA  [] Aphasia   [] Temporary blindness   [] Dysphagia   [] Weakness or numbness in arms   [] Weakness or numbness in legs Musculoskeletal:  [] Arthritis   [] Joint swelling   [] Joint pain   [] Low back pain Hematologic:  [] Easy bruising  [x] Easy bleeding   [] Hypercoagulable state   [] Anemic  [] Hepatitis Gastrointestinal:  [x] Blood in stool   [] Vomiting blood  [] Gastroesophageal reflux/heartburn   [] Difficulty swallowing. Genitourinary:  [] Chronic kidney disease   [] Difficult urination  [] Frequent urination  [] Burning with urination   [] Blood in urine Skin:  [] Rashes   [] Ulcers   [] Wounds Psychological:  [] History of anxiety   []  History of major depression.  Physical Examination  Vitals:   04/17/19 1155 04/17/19 1205 04/17/19 1215 04/17/19 1225  BP: (!) 109/50 (!) 103/46 (!) 123/59 (!) 117/52  Pulse: 70 65 68 68  Resp: 18 18 20  (!) 23  Temp: (!) 96.8 F (36 C)     TempSrc: Tympanic     SpO2: 100% 100% 100% 100%  Weight:      Height:       Body mass index is 26.31 kg/m. Gen:  WD/WN, NAD Head: [place]/AT, No temporalis  wasting. Prominent temp pulse not noted. Ear/Nose/Throat: Hearing grossly intact, nares w/o erythema or drainage, oropharynx w/o Erythema/Exudate Eyes: Sclera non-icteric, conjunctiva clear Neck: Trachea midline.  No JVD.  Pulmonary:  Good air movement, respirations not labored, equal bilaterally.  Cardiac: RRR, normal S1, S2. [name]:  Vessel Right Left  Radial Palpable Palpable  Ulnar Palpable Palpable  Brachial Palpable Palpable  Carotid Palpable, without bruit Palpable, without bruit  Aorta Not palpable N/A  Femoral Palpable Palpable  Popliteal Palpable Palpable  PT Palpable Palpable  DP Palpable Palpable   Left Lower Extremity: Thigh soft, calf soft. Extremity warm distally to toes. Minimal edema. Motor / sensory intact. No acute [name] compromise noted.   Gastrointestinal: soft, non-tender/non-distended. No guarding/reflex.  Musculoskeletal: M/S 5/5 throughout.  Extremities without ischemic changes.  No deformity or atrophy. No edema. Neurologic: Sensation grossly intact in extremities.  Symmetrical.  Speech is fluent. Motor exam as listed above. Psychiatric: Judgment intact, Mood & affect appropriate for pt's clinical situation. Dermatologic: No rashes or ulcers noted.  No cellulitis or open wounds. Lymph : No Cervical, Axillary, or Inguinal lymphadenopathy.  CBC Lab Results  Component Value Date   WBC 8.3 04/17/2019   HGB 11.2 (L) 04/17/2019   HCT 35.1 (L) 04/17/2019   MCV 99.2 04/17/2019   PLT 259 04/17/2019   BMET    Component Value Date/Time   NA 143 04/17/2019 0424   K 3.8 04/17/2019 0424   CL 108 04/17/2019 0424   CO2 28 04/17/2019 0424   GLUCOSE 94 04/17/2019 0424   BUN 13 04/17/2019 0424   CREATININE 0.37 (L) 04/17/2019 0424   CALCIUM 8.7 (L) 04/17/2019 0424   GFRNONAA >60 04/17/2019 0424   GFRAA >60 04/17/2019 0424   Estimated Creatinine Clearance: 59.7 mL/min (A) (by C-G formula based on SCr of 0.37 mg/dL (L)).  COAG Lab Results  Component  Value Date   INR 1.0 04/16/2019   INR 0.93 07/15/2015   Radiology Ct Abdomen Pelvis W Contrast  Result Date: 04/16/2019 CLINICAL DATA:  Rectal bleeding. EXAM: CT ABDOMEN AND PELVIS WITH CONTRAST TECHNIQUE: Multidetector CT imaging of the abdomen and pelvis was performed using the standard protocol following bolus administration of intravenous contrast. CONTRAST:  17mL OMNIPAQUE IOHEXOL 300 MG/ML  SOLN COMPARISON:  CT scan of December 16, 2017. FINDINGS: Lower chest: No acute abnormality. Hepatobiliary: No focal liver abnormality is seen. Status post cholecystectomy. No biliary dilatation. Pancreas: Unremarkable. No pancreatic ductal dilatation or surrounding inflammatory changes. Spleen: Normal in size without focal abnormality. Adrenals/Urinary Tract: Adrenal glands are unremarkable. Kidneys are normal, without renal calculi, focal lesion, or hydronephrosis. Bladder is unremarkable. Stomach/Bowel: Stomach is within normal limits. Appendix appears normal. No evidence of bowel wall thickening, distention, or inflammatory changes. Sigmoid diverticulosis is noted without inflammation. [name]/Lymphatic: No significant [name] findings are present. No enlarged abdominal or pelvic lymph nodes. Reproductive: Status post hysterectomy. No adnexal masses. Other: No abdominal wall hernia or abnormality. No abdominopelvic ascites. Musculoskeletal: No acute or significant osseous findings. IMPRESSION: Sigmoid diverticulosis without inflammation. No acute abnormality seen in the abdomen or pelvis. Electronically Signed   By: Marijo Conception M.D.   On: 04/16/2019 11:29   US Venous Img Lower Bilateral  Result Date: 04/16/2019 CLINICAL DATA:  Bilateral lower extremity edema for the past 6 weeks. History of malignancy. Evaluate for DVT. EXAM: BILATERAL LOWER EXTREMITY VENOUS DOPPLER ULTRASOUND TECHNIQUE: Gray-scale sonography with graded compression, as well as color Doppler and duplex ultrasound were performed to  evaluate the lower extremity deep venous systems from the level of the common femoral vein and including the common femoral, femoral, profunda femoral, popliteal and calf veins including the posterior tibial, peroneal and gastrocnemius veins when visible. The superficial great saphenous vein was also interrogated. Spectral Doppler was utilized to evaluate flow at rest and with distal augmentation maneuvers in the common femoral, femoral and popliteal veins. COMPARISON:  None. FINDINGS: RIGHT LOWER EXTREMITY Common Femoral Vein: No evidence of thrombus. Normal compressibility, respiratory phasicity and response to augmentation. Saphenofemoral Junction: No evidence of thrombus. Normal compressibility and flow on color Doppler imaging. Profunda Femoral Vein: No evidence of thrombus. Normal compressibility and flow on color Doppler imaging. Femoral Vein: No evidence of thrombus. Normal compressibility, respiratory phasicity and response to augmentation. Popliteal Vein: No evidence of thrombus. Normal compressibility, respiratory phasicity and response to augmentation. Calf Veins: No evidence of thrombus. Normal compressibility and flow on color Doppler imaging. Superficial Great Saphenous Vein: No evidence of thrombus. Normal compressibility. Other Findings:  None. LEFT LOWER EXTREMITY Common Femoral Vein: No evidence of thrombus. Normal compressibility, respiratory phasicity and response to augmentation. Saphenofemoral Junction: No evidence of thrombus. Normal compressibility and flow on color Doppler imaging. Profunda Femoral Vein: No evidence of thrombus. Normal compressibility and flow on color Doppler imaging. Femoral Vein: There is age-indeterminate hypoechoic nonocclusive thrombus within the proximal (images 13 and 14) and mid (images 17 and 18) aspects of the left femoral vein. The distal aspect the left femoral vein appears widely patent. Popliteal Vein: No evidence of thrombus. Normal compressibility,  respiratory phasicity and response to augmentation. Calf Veins: No evidence of thrombus. Normal compressibility and flow on color Doppler imaging. Superficial Great Saphenous Vein: No evidence of thrombus. Normal compressibility. Other Findings:  None. IMPRESSION: 1. Examination is positive for age-indeterminate nonocclusive DVT involving the proximal and mid aspects of the left femoral vein - while  potentially chronic in etiology, in the absence of prior examinations, an acute on chronic process is not excluded. Clinical correlation is advised. 2. No evidence of DVT within the right lower extremity. Electronically Signed   By: Sandi Mariscal M.D.   On: 04/16/2019 15:11   Assessment/Plan The patient is an 78 year old female with a past medical history of TIA, sarcoidosis, osteopenia, mitral valve prolapse, macular degeneration, osteoarthritis of lower extremity, hyperlipidemia, hypercholesteremia, heart murmur, glaucoma, carotid stenosis, anal cancer, asthma, recent history of DVT on Eliquis who presented to the [facility] yesterday with a chief complaint of "rectal bleeding". 1. Left Lower Extremity DVT: Patient with age-indeterminate left lower extremity DVT.  Patient is asymptomatic and physical exam is unremarkable.  Patient underwent endoscopy/colonoscopy with gastroenterology today who recommends the patient can restart her anticoagulation in two days.  Would recommend restarting Eliquis two days as per gastroenterology.  If for some reason, the patient is unable to tolerate Eliquis / bleeding is a continued issue would recommend stopping anticoagulation and IVC filter will be placed.  2.  Hyperlipidemia: On aspirin and Crestor for medical management. Encouraged good control as its slows the progression of atherosclerotic disease 3.  Hypertension: On appropriate medications. Encouraged good control as its slows the progression of atherosclerotic disease.  Discussed with Dr.  Mayme Genta, PA-C  04/17/2019 1:45 PM  This note was created with Dragon medical transcription system.  Any error is purely unintentional

## 2019-04-17 NOTE — Transfer of Care (Signed)
Immediate Anesthesia Transfer of Care Note  Patient: Lisa [name]  Procedure(s) Performed: ESOPHAGOGASTRODUODENOSCOPY (EGD) WITH PROPOFOL (N/A ) COLONOSCOPY WITH PROPOFOL (N/A )  Patient Location: PACU  Anesthesia Type:General  Level of Consciousness: awake, alert  and oriented  Airway & Oxygen Therapy: Patient Spontanous Breathing and Patient connected to nasal cannula oxygen  Post-op Assessment: Report given to RN and Post -op Vital signs reviewed and stable  Post vital signs: Reviewed and stable  Last Vitals:  Vitals Value Taken Time  BP    Temp    Pulse    Resp    SpO2      Last Pain:  Vitals:   04/17/19 1052  TempSrc: Tympanic  PainSc: 0-No pain         Complications: No apparent anesthesia complications

## 2019-04-17 NOTE — Progress Notes (Signed)
Patient resting in bed. She has completed  Bowl prep. Rapid covid test draw- results negative. Consent reviewed and signed by patient. Placed in patient's chart. Patient NPO after MN. Will continue to monitor.

## 2019-04-17 NOTE — Anesthesia Postprocedure Evaluation (Signed)
Anesthesia Post Note  Patient: Lisa [name]  Procedure(s) Performed: ESOPHAGOGASTRODUODENOSCOPY (EGD) WITH PROPOFOL (N/A ) COLONOSCOPY WITH PROPOFOL (N/A )  Patient location during evaluation: Endoscopy Anesthesia Type: General Level of consciousness: awake and alert Pain management: pain level controlled Vital Signs Assessment: post-procedure vital signs reviewed and stable Respiratory status: spontaneous breathing, nonlabored ventilation, respiratory function stable and patient connected to nasal cannula oxygen Cardiovascular status: blood pressure returned to baseline and stable Postop Assessment: no apparent nausea or vomiting Anesthetic complications: no     Last Vitals:  Vitals:   04/17/19 1215 04/17/19 1225  BP: (!) 123/59 (!) 117/52  Pulse: 68 68  Resp: 20 (!) 23  Temp:    SpO2: 100% 100%    Last Pain:  Vitals:   04/17/19 1225  TempSrc:   PainSc: 0-No pain                 Clyda Smyth S

## 2019-04-17 NOTE — Anesthesia Preprocedure Evaluation (Addendum)
Anesthesia Evaluation  Patient identified by MRN, date of birth, ID band Patient awake    Reviewed: Allergy & Precautions, NPO status , Patient's Chart, lab work & pertinent test results, reviewed documented beta blocker date and time   Airway Mallampati: II  TM Distance: >3 FB     Dental  (+) Chipped   Pulmonary asthma ,           Cardiovascular + Valvular Problems/Murmurs      Neuro/Psych  Headaches, TIA Neuromuscular disease    GI/Hepatic   Endo/Other    Renal/GU      Musculoskeletal  (+) Arthritis ,   Abdominal   Peds  Hematology   Anesthesia Other Findings Sarcoid. EKG shows poor R waves, otherwise ok.  Reproductive/Obstetrics                             Anesthesia Physical Anesthesia Plan  ASA: III  Anesthesia Plan: General   Post-op Pain Management:    Induction: Intravenous  PONV Risk Score and Plan:   Airway Management Planned:   Additional Equipment:   Intra-op Plan:   Post-operative Plan:   Informed Consent: I have reviewed the patients History and Physical, chart, labs and discussed the procedure including the risks, benefits and alternatives for the proposed anesthesia with the patient or authorized representative who has indicated his/her understanding and acceptance.       Plan Discussed with: CRNA  Anesthesia Plan Comments:         Anesthesia Quick Evaluation

## 2019-04-18 ENCOUNTER — Encounter: Payer: Self-pay | Admitting: Gastroenterology

## 2019-04-18 LAB — CBC
HCT: 37.1 % (ref 36.0–46.0)
Hemoglobin: 12 g/dL (ref 12.0–15.0)
MCH: 32.1 pg (ref 26.0–34.0)
MCHC: 32.3 g/dL (ref 30.0–36.0)
MCV: 99.2 fL (ref 80.0–100.0)
Platelets: 260 10*3/uL (ref 150–400)
RBC: 3.74 MIL/uL — ABNORMAL LOW (ref 3.87–5.11)
RDW: 13.1 % (ref 11.5–15.5)
WBC: 7.3 10*3/uL (ref 4.0–10.5)
nRBC: 0 % (ref 0.0–0.2)

## 2019-04-18 LAB — BASIC METABOLIC PANEL
Anion gap: 9 (ref 5–15)
BUN: 12 mg/dL (ref 8–23)
CO2: 28 mmol/L (ref 22–32)
Calcium: 8.6 mg/dL — ABNORMAL LOW (ref 8.9–10.3)
Chloride: 106 mmol/L (ref 98–111)
Creatinine, Ser: 0.66 mg/dL (ref 0.44–1.00)
GFR calc Af Amer: >60 mL/min (ref >60)
GFR calc non Af Amer: >60 mL/min (ref >60)
Glucose, Bld: 97 mg/dL (ref 70–99)
Potassium: 3.2 mmol/L — ABNORMAL LOW (ref 3.5–5.1)
Sodium: 143 mmol/L (ref 135–145)

## 2019-04-18 MED ORDER — PANTOPRAZOLE SODIUM 40 MG PO TBEC: 40.0000 mg | DELAYED_RELEASE_TABLET | Freq: Every day | ORAL | 1 refills | Status: DC

## 2019-04-18 MED ORDER — PANTOPRAZOLE SODIUM 40 MG PO TBEC: 40.0000 mg | DELAYED_RELEASE_TABLET | Freq: Every day | ORAL | 1 refills | Status: AC

## 2019-04-18 NOTE — Progress Notes (Signed)
Discharge instructions given and went over with patient at bedside. All questions answered.   Instructed MD to call [facility] for Protonix prescription.   Patient discharged home. Madlyn Frankel, RN

## 2019-04-18 NOTE — Discharge Instructions (Signed)
Can restart Eliquis on June 26.

## 2019-04-20 LAB — SURGICAL PATHOLOGY

## 2019-04-20 NOTE — Discharge Summary (Signed)
Lisa [name], is a 78 y.o. female  DOB 1941/01/29  MRN [id number].  Admission date:  04/16/2019  Admitting Physician  Loletha Grayer, MD  Discharge Date:  04/18/2019   Primary MD  Clarisse Gouge, MD  Recommendations for primary care physician for things to follow:   Follow-up with PCP in 1 week   Admission Diagnosis  Swelling [R60.9]   Discharge Diagnosis  Swelling [R60.9]    Active Problems:   GI bleed   Blood in stool   Gastritis without bleeding   Rectal polyp   Polyp of transverse colon      Past Medical History:  Diagnosis Date  . Arthritis   . Asthma   . Cancer (HCC)    anal  . Carotid stenosis   . Colon polyp 05/22/2013  . Foot mass   . Glaucoma (increased eye pressure)   . Headache   . Heart murmur   . Hypercholesterolemia   . Hyperlipidemia   . Irregular heartbeat   . Localized primary osteoarthritis of lower leg   . Macular degeneration   . Mitral valve prolapse   . Neck pain   . Osteopenia   . Sarcoidosis   . Sarcoidosis   . Shortness of breath dyspnea    with exertion  . TIA (transient ischemic attack)     Past Surgical History:  Procedure Laterality Date  . ABDOMINAL HYSTERECTOMY    . cataract surgery  08/18/09   left eye  . COLONOSCOPY  05/22/2013  . COLONOSCOPY WITH PROPOFOL N/A 09/15/2015   Procedure: COLONOSCOPY WITH PROPOFOL;  Surgeon: Hulen Luster, MD;  Location: [facility] ENDOSCOPY;  Service: Gastroenterology;  Laterality: N/A;  . COLONOSCOPY WITH PROPOFOL N/A 04/17/2019   Procedure: COLONOSCOPY WITH PROPOFOL;  Surgeon: Lucilla Lame, MD;  Location: [facility] ENDOSCOPY;  Service: Endoscopy;  Laterality: N/A;  . DILATION AND CURETTAGE OF UTERUS    . ESOPHAGOGASTRODUODENOSCOPY (EGD) WITH PROPOFOL N/A 04/17/2019   Procedure: ESOPHAGOGASTRODUODENOSCOPY (EGD) WITH PROPOFOL;  Surgeon: Lucilla Lame, MD;  Location: [facility] ENDOSCOPY;  Service: Endoscopy;  Laterality: N/A;  . EYE SURGERY Bilateral    cataract extraction  . HEMORRHOID SURGERY N/A 07/24/2015   Procedure: HEMORRHOIDECTOMY/INTERNAL AND EXTERNAL;  Surgeon: Leonie Green, MD;  Location: [facility] ORS;  Service: General;  Laterality: N/A;  . hysterectomy with ovaries intact    . MASS EXCISION N/A 08/21/2015   Procedure: EXCISION MASS/ ANAL MASS;  Surgeon: Leonie Green, MD;  Location: [facility] ORS;  Service: General;  Laterality: N/A;  . TONSILLECTOMY    . TUMOR EXCISION N/A 09/16/2015   Procedure: TUMOR EXCISION RECTAL;  Surgeon: Leonie Green, MD;  Location: [facility] ORS;  Service: General;  Laterality: N/A;       History of present illness and  Hospital Course:     Kindly see H&P for history of present illness and admission details, please review complete Labs, Consult reports and Test reports for all details in brief  HPI  from the history and physical done on the day of admission 78 year old female with history of TIA, sarcoidosis, osteopenia, mitral prolapse, macular degeneration, hyperlipidemia, recent history of DVT on Eliquis comes because of rectal bleeding and admitted for GI bleed.   Hospital Course  Rectal bleeding, patient is admitted to medical service, never required blood transfusion, patient's hemoglobin stayed stable around 1.2, seen by gastroenterology for EGD, colonoscopy.  Patient EGD showed acute gastritis, colonoscopy showed diverticulosis, small polyp in the transverse colon which was removed.  Gastroenterology  recommended to restart Eliquis 2 days after the procedure.  Same explained to the patient, patient discharged home in stable condition.  Patient discharged hemoglobin 12 / #2 hypokalemia due to poor p.o. intake, patient should get better with diet,. #3. left leg DVT.  Patient seen by vascular surgery, they saw the patient because of history of DVT, and her presenting problem of rectal  bleed, recommended that if patient for some reason cannot tolerate anticoagulation with Eliquis that time they would recommend stopping anticoagulation and IVC filter placement.  Because patient restarted the Eliquis after discharge recommended to follow.  If patient gets a rectal bleed again on Eliquis patient is to have IVC filter placed.  #4 acute gastritis, hiatal hernia, patient PPI dose has been changed to 40 mg daily. Discharge Condition: Stable   Follow UP  Follow-up Information    Clarisse Gouge, MD In 1 week.   Specialty: Family Medicine Why: no answer at office,patient to make own follow up appt Contact information: [place] [address] [phone]        Lucilla Lame, MD. Go on 05/14/2019.   Specialty: Gastroenterology Why: @ 3:15pm Contact information: [place]  [place] [address] [phone]             Discharge Instructions  and  Discharge Medications      Allergies as of 04/18/2019      Reactions   Aspirin Other (See Comments)   Pt can take low dose aspirin with no reaction   Dairy Aid [lactase] Other (See Comments)   Diazepam Other (See Comments)   Affected her hearing and sight   Shellfish-derived Products Swelling      Medication List    STOP taking these medications   omeprazole 20 MG capsule Commonly known as: PRILOSEC     TAKE these medications   albuterol 108 (90 Base) MCG/ACT inhaler Commonly known as: ProAir HFA Inhale 2 puffs into the lungs every 6 (six) hours as needed for wheezing or shortness of breath.   Alphagan P 0.15 % ophthalmic solution Generic drug: brimonidine Place 2 drops into the left eye 2 (two) times daily.   aspirin EC 81 MG tablet Take 81 mg by mouth daily.   beclomethasone 80 MCG/ACT inhaler Commonly known as: QVAR Inhale 1 puff into the lungs 2 (two) times a day.   budesonide 3 MG 24 hr capsule Commonly known as: ENTOCORT EC Take 9 mg by mouth daily.   cholecalciferol  1000 units tablet Commonly known as: VITAMIN D Take 1,000 Units by mouth daily.   Eliquis 5 MG Tabs tablet Generic drug: apixaban Take 5 mg by mouth 2 (two) times daily.   furosemide 20 MG tablet Commonly known as: LASIX Take 40 mg by mouth daily.   lidocaine 5 % Commonly known as: LIDODERM Place 1 patch onto the skin daily as needed. Remove & Discard patch within 12 hours or as directed by MD   magnesium oxide 400 MG tablet Commonly known as: MAG-OX Take 400 mg by mouth daily.   multivitamin with minerals Tabs tablet Take 1 tablet by mouth daily. Centrum Silver   pantoprazole 40 MG tablet Commonly known as: Protonix Take 1 tablet (40 mg total) by mouth daily.   potassium chloride 10 MEQ tablet Commonly known as: K-DUR Take 10-20 mEq by mouth 2 (two) times daily.   PRESERVISION AREDS PO Take 1 capsule by mouth daily.   rosuvastatin 5 MG tablet Commonly known as: CRESTOR Take  5 mg by mouth every morning.   spironolactone 50 MG tablet Commonly known as: ALDACTONE Take 50 mg by mouth daily.         Diet and Activity recommendation: See Discharge Instructions above   Consults obtained -gastroenterology, vascular surgery   Major procedures and Radiology Reports - PLEASE review detailed and final reports for all details, in brief -     Ct Abdomen Pelvis W Contrast  Result Date: 04/16/2019 CLINICAL DATA:  Rectal bleeding. EXAM: CT ABDOMEN AND PELVIS WITH CONTRAST TECHNIQUE: Multidetector CT imaging of the abdomen and pelvis was performed using the standard protocol following bolus administration of intravenous contrast. CONTRAST:  158mL OMNIPAQUE IOHEXOL 300 MG/ML  SOLN COMPARISON:  CT scan of December 16, 2017. FINDINGS: Lower chest: No acute abnormality. Hepatobiliary: No focal liver abnormality is seen. Status post cholecystectomy. No biliary dilatation. Pancreas: Unremarkable. No pancreatic ductal dilatation or surrounding inflammatory changes. Spleen: Normal  in size without focal abnormality. Adrenals/Urinary Tract: Adrenal glands are unremarkable. Kidneys are normal, without renal calculi, focal lesion, or hydronephrosis. Bladder is unremarkable. Stomach/Bowel: Stomach is within normal limits. Appendix appears normal. No evidence of bowel wall thickening, distention, or inflammatory changes. Sigmoid diverticulosis is noted without inflammation. Vascular/Lymphatic: No significant vascular findings are present. No enlarged abdominal or pelvic lymph nodes. Reproductive: Status post hysterectomy. No adnexal masses. Other: No abdominal wall hernia or abnormality. No abdominopelvic ascites. Musculoskeletal: No acute or significant osseous findings. IMPRESSION: Sigmoid diverticulosis without inflammation. No acute abnormality seen in the abdomen or pelvis. Electronically Signed   By: Marijo Conception M.D.   On: 04/16/2019 11:29   US Venous Img Lower Bilateral  Result Date: 04/16/2019 CLINICAL DATA:  Bilateral lower extremity edema for the past 6 weeks. History of malignancy. Evaluate for DVT. EXAM: BILATERAL LOWER EXTREMITY VENOUS DOPPLER ULTRASOUND TECHNIQUE: Gray-scale sonography with graded compression, as well as color Doppler and duplex ultrasound were performed to evaluate the lower extremity deep venous systems from the level of the common femoral vein and including the common femoral, femoral, profunda femoral, popliteal and calf veins including the posterior tibial, peroneal and gastrocnemius veins when visible. The superficial great saphenous vein was also interrogated. Spectral Doppler was utilized to evaluate flow at rest and with distal augmentation maneuvers in the common femoral, femoral and popliteal veins. COMPARISON:  None. FINDINGS: RIGHT LOWER EXTREMITY Common Femoral Vein: No evidence of thrombus. Normal compressibility, respiratory phasicity and response to augmentation. Saphenofemoral Junction: No evidence of thrombus. Normal compressibility and flow  on color Doppler imaging. Profunda Femoral Vein: No evidence of thrombus. Normal compressibility and flow on color Doppler imaging. Femoral Vein: No evidence of thrombus. Normal compressibility, respiratory phasicity and response to augmentation. Popliteal Vein: No evidence of thrombus. Normal compressibility, respiratory phasicity and response to augmentation. Calf Veins: No evidence of thrombus. Normal compressibility and flow on color Doppler imaging. Superficial Great Saphenous Vein: No evidence of thrombus. Normal compressibility. Other Findings:  None. LEFT LOWER EXTREMITY Common Femoral Vein: No evidence of thrombus. Normal compressibility, respiratory phasicity and response to augmentation. Saphenofemoral Junction: No evidence of thrombus. Normal compressibility and flow on color Doppler imaging. Profunda Femoral Vein: No evidence of thrombus. Normal compressibility and flow on color Doppler imaging. Femoral Vein: There is age-indeterminate hypoechoic nonocclusive thrombus within the proximal (images 13 and 14) and mid (images 17 and 18) aspects of the left femoral vein. The distal aspect the left femoral vein appears widely patent. Popliteal Vein: No evidence of thrombus. Normal compressibility, respiratory phasicity and response  to augmentation. Calf Veins: No evidence of thrombus. Normal compressibility and flow on color Doppler imaging. Superficial Great Saphenous Vein: No evidence of thrombus. Normal compressibility. Other Findings:  None. IMPRESSION: 1. Examination is positive for age-indeterminate nonocclusive DVT involving the proximal and mid aspects of the left femoral vein - while potentially chronic in etiology, in the absence of prior examinations, an acute on chronic process is not excluded. Clinical correlation is advised. 2. No evidence of DVT within the right lower extremity. Electronically Signed   By: Sandi Mariscal M.D.   On: 04/16/2019 15:11    Micro Results     Recent Results (from  the past 240 hour(s))  Urine culture     Status: Abnormal   Collection Time: 04/16/19 10:28 AM   Specimen: Urine, Clean Catch  Result Value Ref Range Status   Specimen Description   Final    URINE, CLEAN CATCH Performed at [facility], [address], [place], [place] [address]    Special Requests   Final    Normal Performed at [facility], [address] [place], [place] [address]    Culture (A)  Final    <10,000 COLONIES/mL INSIGNIFICANT GROWTH Performed at [facility], [place] [address], [place], [place] [address]    Report Status 04/17/2019 FINAL  Final  Novel Coronavirus,NAA,(SEND-OUT TO REF LAB - TAT 24-48 hrs); Hosp Order     Status: None   Collection Time: 04/16/19 11:46 AM   Specimen: Nasopharyngeal Swab; Respiratory  Result Value Ref Range Status   SARS-CoV-2, NAA NOT DETECTED NOT DETECTED Final    Comment: (NOTE) This test was developed and its performance characteristics determined by [employer]. This test has not been FDA cleared or approved. This test has been authorized by FDA under an Emergency Use Authorization (EUA). This test is only authorized for the duration of time the declaration that circumstances exist justifying the authorization of the emergency use of in vitro diagnostic tests for detection of SARS-CoV-2 virus and/or diagnosis of COVID-19 infection under section 564(b)(1) of the Act, 21 U.S.C. 025KYH-0(W)(2), unless the authorization is terminated or revoked sooner. When diagnostic testing is negative, the possibility of a false negative result should be considered in the context of a patient's recent exposures and the presence of clinical signs and symptoms consistent with COVID-19. An individual without symptoms of COVID-19 and who is not shedding SARS-CoV-2 virus would expect to have a negative (not detected) result in this assay. Performed  At: [facility] [address] [place], [place]  [id number] Rush Farmer MD VO:[id number]    [place]  Final    Comment: Performed at [facility], [address] [place], [place] [address]  SARS Coronavirus 2 [facility] order, Performed in [place] hospital lab)     Status: None   Collection Time: 04/16/19 10:29 PM   Specimen: Nasopharyngeal Swab  Result Value Ref Range Status   SARS Coronavirus 2 NEGATIVE NEGATIVE Final    Comment: (NOTE) If result is NEGATIVE SARS-CoV-2 target nucleic acids are NOT DETECTED. The SARS-CoV-2 RNA is generally detectable in upper and lower  respiratory specimens during the acute phase of infection. The lowest  concentration of SARS-CoV-2 viral copies this assay can detect is 250  copies / mL. A negative result does not preclude SARS-CoV-2 infection  and should not be used as the sole basis for treatment or other  patient management decisions.  A negative result may occur with  improper specimen  collection / handling, submission of specimen other  than nasopharyngeal swab, presence of viral mutation(s) within the  areas targeted by this assay, and inadequate number of viral copies  (<250 copies / mL). A negative result must be combined with clinical  observations, patient history, and epidemiological information. If result is POSITIVE SARS-CoV-2 target nucleic acids are DETECTED. The SARS-CoV-2 RNA is generally detectable in upper and lower  respiratory specimens dur ing the acute phase of infection.  Positive  results are indicative of active infection with SARS-CoV-2.  Clinical  correlation with patient history and other diagnostic information is  necessary to determine patient infection status.  Positive results do  not rule out bacterial infection or co-infection with other viruses. If result is PRESUMPTIVE POSTIVE SARS-CoV-2 nucleic acids MAY BE PRESENT.   A presumptive positive result was obtained on the submitted specimen  and confirmed on repeat  testing.  While 2019 novel coronavirus  (SARS-CoV-2) nucleic acids may be present in the submitted sample  additional confirmatory testing may be necessary for epidemiological  and / or clinical management purposes  to differentiate between  SARS-CoV-2 and other Sarbecovirus currently known to infect humans.  If clinically indicated additional testing with an alternate test  methodology [phone]) is advised. The SARS-CoV-2 RNA is generally  detectable in upper and lower respiratory sp ecimens during the acute  phase of infection. The expected result is Negative. Fact Sheet for Patients:  StrictlyIdeas.no Fact Sheet for Healthcare Providers: BankingDealers.co.za This test is not yet approved or cleared by the [place] FDA and has been authorized for detection and/or diagnosis of SARS-CoV-2 by FDA under an Emergency Use Authorization (EUA).  This EUA will remain in effect (meaning this test can be used) for the duration of the COVID-19 declaration under Section 564(b)(1) of the Act, 21 U.S.C. section 360bbb-3(b)(1), unless the authorization is terminated or revoked sooner. Performed at [facility], [address] [place], [place] [address]        Today   Subjective:   Lisa [name] today has no headache,no chest abdominal pain,no new weakness tingling or numbness, feels much better wants to go home today.   Objective:   Blood pressure 128/63, pulse 76, temperature 98.4 F (36.9 C), temperature source Oral, resp. rate 18, height 5\' 7"  (1.702 m), weight 76.2 kg, SpO2 99 %.  No intake or output data in the 24 hours ending 04/20/19 1533  Exam Awake Alert, Oriented x 3, No new F.N deficits, Normal affect [place].AT,PERRAL Supple Neck,No JVD, No cervical lymphadenopathy appriciated.  Symmetrical Chest wall movement, Good air movement bilaterally, CTAB RRR,No Gallops,Rubs or new Murmurs, No Parasternal Heave +ve B.Sounds,  Abd Soft, Non tender, No organomegaly appriciated, No rebound -guarding or rigidity. No Cyanosis, Clubbing or edema, No new Rash or bruise  Data Review   CBC w Diff:  Lab Results  Component Value Date   WBC 7.3 04/18/2019   HGB 12.0 04/18/2019   HCT 37.1 04/18/2019   PLT 260 04/18/2019   LYMPHOPCT 15 07/15/2015   MONOPCT 10 07/15/2015   EOSPCT 1 07/15/2015   BASOPCT 1 07/15/2015    CMP:  Lab Results  Component Value Date   NA 143 04/18/2019   K 3.2 (L) 04/18/2019   CL 106 04/18/2019   CO2 28 04/18/2019   BUN 12 04/18/2019   CREATININE 0.66 04/18/2019   PROT 6.7 04/16/2019   ALBUMIN 3.9 04/16/2019   BILITOT 0.4 04/16/2019   ALKPHOS 59 04/16/2019   AST 13 (L)  04/16/2019   ALT 19 04/16/2019  .   Total Time in preparing paper work, data evaluation and todays exam - 35 minutes  Epifanio Lesches M.D on 04/18/2019 at 3:33 PM    Note: This dictation was prepared with Dragon dictation along with smaller phrase technology. Any transcriptional errors that result from this process are unintentional.

## 2019-05-03 ENCOUNTER — Encounter: Payer: Self-pay | Admitting: Gastroenterology

## 2019-05-14 ENCOUNTER — Ambulatory Visit: Payer: Medicare Other | Admitting: Gastroenterology

## 2019-06-26 ENCOUNTER — Emergency Department: Payer: Medicare Other

## 2019-06-26 ENCOUNTER — Inpatient Hospital Stay
Admission: EM | Admit: 2019-06-26 | Discharge: 2019-06-30 | DRG: 470 | Disposition: A | Discharge status: Skilled Nursing Facility | Payer: Medicare Other | Attending: Internal Medicine | Admitting: Internal Medicine

## 2019-06-26 ENCOUNTER — Encounter: Payer: Self-pay | Admitting: Emergency Medicine

## 2019-06-26 ENCOUNTER — Other Ambulatory Visit: Payer: Self-pay

## 2019-06-26 DIAGNOSIS — S72032A Displaced midcervical fracture of left femur, initial encounter for closed fracture: Principal | ICD-10-CM | POA: Diagnosis present

## 2019-06-26 DIAGNOSIS — Y9289 Other specified places as the place of occurrence of the external cause: Secondary | ICD-10-CM

## 2019-06-26 DIAGNOSIS — H3530 Unspecified macular degeneration: Secondary | ICD-10-CM | POA: Diagnosis present

## 2019-06-26 DIAGNOSIS — Z20828 Contact with and (suspected) exposure to other viral communicable diseases: Secondary | ICD-10-CM | POA: Diagnosis present

## 2019-06-26 DIAGNOSIS — Z79899 Other long term (current) drug therapy: Secondary | ICD-10-CM

## 2019-06-26 DIAGNOSIS — I6529 Occlusion and stenosis of unspecified carotid artery: Secondary | ICD-10-CM | POA: Diagnosis present

## 2019-06-26 DIAGNOSIS — G4700 Insomnia, unspecified: Secondary | ICD-10-CM | POA: Diagnosis present

## 2019-06-26 DIAGNOSIS — Z8673 Personal history of transient ischemic attack (TIA), and cerebral infarction without residual deficits: Secondary | ICD-10-CM | POA: Diagnosis not present

## 2019-06-26 DIAGNOSIS — H409 Unspecified glaucoma: Secondary | ICD-10-CM | POA: Diagnosis present

## 2019-06-26 DIAGNOSIS — Z7901 Long term (current) use of anticoagulants: Secondary | ICD-10-CM | POA: Diagnosis not present

## 2019-06-26 DIAGNOSIS — I270 Primary pulmonary hypertension: Secondary | ICD-10-CM | POA: Diagnosis present

## 2019-06-26 DIAGNOSIS — Z886 Allergy status to analgesic agent status: Secondary | ICD-10-CM | POA: Diagnosis not present

## 2019-06-26 DIAGNOSIS — Z01818 Encounter for other preprocedural examination: Secondary | ICD-10-CM

## 2019-06-26 DIAGNOSIS — M8580 Other specified disorders of bone density and structure, unspecified site: Secondary | ICD-10-CM | POA: Diagnosis present

## 2019-06-26 DIAGNOSIS — E785 Hyperlipidemia, unspecified: Secondary | ICD-10-CM | POA: Diagnosis present

## 2019-06-26 DIAGNOSIS — I341 Nonrheumatic mitral (valve) prolapse: Secondary | ICD-10-CM | POA: Diagnosis present

## 2019-06-26 DIAGNOSIS — I480 Paroxysmal atrial fibrillation: Secondary | ICD-10-CM | POA: Diagnosis present

## 2019-06-26 DIAGNOSIS — W010XXA Fall on same level from slipping, tripping and stumbling without subsequent striking against object, initial encounter: Secondary | ICD-10-CM | POA: Diagnosis present

## 2019-06-26 DIAGNOSIS — D869 Sarcoidosis, unspecified: Secondary | ICD-10-CM | POA: Diagnosis present

## 2019-06-26 DIAGNOSIS — Z8249 Family history of ischemic heart disease and other diseases of the circulatory system: Secondary | ICD-10-CM | POA: Diagnosis not present

## 2019-06-26 DIAGNOSIS — E7800 Pure hypercholesterolemia, unspecified: Secondary | ICD-10-CM | POA: Diagnosis present

## 2019-06-26 DIAGNOSIS — Z96649 Presence of unspecified artificial hip joint: Secondary | ICD-10-CM

## 2019-06-26 DIAGNOSIS — S72002A Fracture of unspecified part of neck of left femur, initial encounter for closed fracture: Secondary | ICD-10-CM

## 2019-06-26 DIAGNOSIS — J45909 Unspecified asthma, uncomplicated: Secondary | ICD-10-CM | POA: Diagnosis present

## 2019-06-26 DIAGNOSIS — R52 Pain, unspecified: Secondary | ICD-10-CM

## 2019-06-26 DIAGNOSIS — Z888 Allergy status to other drugs, medicaments and biological substances status: Secondary | ICD-10-CM

## 2019-06-26 DIAGNOSIS — S72009A Fracture of unspecified part of neck of unspecified femur, initial encounter for closed fracture: Secondary | ICD-10-CM | POA: Diagnosis present

## 2019-06-26 LAB — CBC WITH DIFFERENTIAL/PLATELET
Abs Immature Granulocytes: 0.2 10*3/uL — ABNORMAL HIGH (ref 0.00–0.07)
Basophils Absolute: 0.1 10*3/uL (ref 0.0–0.1)
Basophils Relative: 1 %
Eosinophils Absolute: 0 10*3/uL (ref 0.0–0.5)
Eosinophils Relative: 0 %
HCT: 42.3 % (ref 36.0–46.0)
Hemoglobin: 13.6 g/dL (ref 12.0–15.0)
Immature Granulocytes: 2 %
Lymphocytes Relative: 12 %
Lymphs Abs: 1.2 10*3/uL (ref 0.7–4.0)
MCH: 31.6 pg (ref 26.0–34.0)
MCHC: 32.2 g/dL (ref 30.0–36.0)
MCV: 98.1 fL (ref 80.0–100.0)
Monocytes Absolute: 0.7 10*3/uL (ref 0.1–1.0)
Monocytes Relative: 7 %
Neutro Abs: 7.9 10*3/uL — ABNORMAL HIGH (ref 1.7–7.7)
Neutrophils Relative %: 78 %
Platelets: 288 10*3/uL (ref 150–400)
RBC: 4.31 MIL/uL (ref 3.87–5.11)
RDW: 13 % (ref 11.5–15.5)
WBC: 10.1 10*3/uL (ref 4.0–10.5)
nRBC: 0 % (ref 0.0–0.2)

## 2019-06-26 LAB — COMPREHENSIVE METABOLIC PANEL
ALT: 27 U/L (ref 0–44)
AST: 23 U/L (ref 15–41)
Albumin: 3.6 g/dL (ref 3.5–5.0)
Alkaline Phosphatase: 48 U/L (ref 38–126)
Anion gap: 11 (ref 5–15)
BUN: 23 mg/dL (ref 8–23)
CO2: 25 mmol/L (ref 22–32)
Calcium: 9.1 mg/dL (ref 8.9–10.3)
Chloride: 105 mmol/L (ref 98–111)
Creatinine, Ser: 0.6 mg/dL (ref 0.44–1.00)
GFR calc Af Amer: >60 mL/min (ref >60)
GFR calc non Af Amer: >60 mL/min (ref >60)
Glucose, Bld: 105 mg/dL — ABNORMAL HIGH (ref 70–99)
Potassium: 4 mmol/L (ref 3.5–5.1)
Sodium: 141 mmol/L (ref 135–145)
Total Bilirubin: 0.7 mg/dL (ref 0.3–1.2)
Total Protein: 6.6 g/dL (ref 6.5–8.1)

## 2019-06-26 LAB — PROTIME-INR
INR: 1.1 (ref 0.8–1.2)
Prothrombin Time: 14.2 seconds (ref 11.4–15.2)

## 2019-06-26 LAB — SURGICAL PCR SCREEN
MRSA, PCR: NEGATIVE
Staphylococcus aureus: NEGATIVE

## 2019-06-26 LAB — SARS CORONAVIRUS 2 BY RT PCR (HOSPITAL ORDER, PERFORMED IN ~~LOC~~ HOSPITAL LAB): SARS Coronavirus 2: NEGATIVE

## 2019-06-26 LAB — TYPE AND SCREEN
ABO/RH(D): A POS
Antibody Screen: NEGATIVE

## 2019-06-26 LAB — APTT: aPTT: 39 seconds — ABNORMAL HIGH (ref 24–36)

## 2019-06-26 MED ORDER — ROSUVASTATIN CALCIUM 10 MG PO TABS
5.0000 mg | ORAL_TABLET | ORAL | Status: DC
  Administered 2019-06-29 – 2019-06-30 (×2): 5 mg via ORAL
  Filled 2019-06-26 (×2): qty 1

## 2019-06-26 MED ORDER — LIDOCAINE 5 % EX PTCH
1.0000 | MEDICATED_PATCH | Freq: Every day | CUTANEOUS | Status: DC | PRN
  Filled 2019-06-26: qty 1

## 2019-06-26 MED ORDER — PANTOPRAZOLE SODIUM 40 MG PO TBEC
40.0000 mg | DELAYED_RELEASE_TABLET | Freq: Every day | ORAL | Status: DC
  Administered 2019-06-27 – 2019-06-30 (×3): 40 mg via ORAL
  Filled 2019-06-26 (×3): qty 1

## 2019-06-26 MED ORDER — SODIUM CHLORIDE 0.9 % IV SOLN
INTRAVENOUS | Status: AC
  Administered 2019-06-26: 15:00:00 via INTRAVENOUS

## 2019-06-26 MED ORDER — ONDANSETRON HCL 4 MG/2ML IJ SOLN
4.0000 mg | Freq: Four times a day (QID) | INTRAMUSCULAR | Status: DC | PRN
  Administered 2019-06-26: 4 mg via INTRAVENOUS
  Filled 2019-06-26: qty 2

## 2019-06-26 MED ORDER — FENTANYL CITRATE (PF) 100 MCG/2ML IJ SOLN
50.0000 ug | INTRAMUSCULAR | Status: AC | PRN
  Administered 2019-06-26 (×2): 50 ug via INTRAVENOUS
  Filled 2019-06-26 (×2): qty 2

## 2019-06-26 MED ORDER — OXYCODONE HCL 5 MG PO TABS
5.0000 mg | ORAL_TABLET | ORAL | Status: DC | PRN
  Administered 2019-06-26 – 2019-06-28 (×4): 5 mg via ORAL
  Filled 2019-06-26 (×4): qty 1

## 2019-06-26 MED ORDER — MUPIROCIN 2 % EX OINT
1.0000 "application " | TOPICAL_OINTMENT | Freq: Two times a day (BID) | CUTANEOUS | Status: DC
  Filled 2019-06-26 (×2): qty 22

## 2019-06-26 MED ORDER — ZOLPIDEM TARTRATE 5 MG PO TABS
5.0000 mg | ORAL_TABLET | Freq: Every day | ORAL | Status: DC
  Administered 2019-06-27 – 2019-06-28 (×2): 5 mg via ORAL
  Filled 2019-06-26 (×3): qty 1

## 2019-06-26 MED ORDER — SPIRONOLACTONE 25 MG PO TABS
50.0000 mg | ORAL_TABLET | Freq: Every day | ORAL | Status: DC
  Administered 2019-06-29 – 2019-06-30 (×2): 50 mg via ORAL
  Filled 2019-06-26 (×2): qty 2

## 2019-06-26 MED ORDER — ACETAMINOPHEN 650 MG RE SUPP: 650.0000 mg | Freq: Four times a day (QID) | RECTAL | Status: DC | PRN

## 2019-06-26 MED ORDER — ACETAMINOPHEN 325 MG PO TABS
650.0000 mg | ORAL_TABLET | Freq: Four times a day (QID) | ORAL | Status: DC | PRN
  Administered 2019-06-26 – 2019-06-29 (×2): 650 mg via ORAL
  Filled 2019-06-26 (×2): qty 2

## 2019-06-26 MED ORDER — BUDESONIDE 0.25 MG/2ML IN SUSP
0.2500 mg | Freq: Two times a day (BID) | RESPIRATORY_TRACT | Status: DC
  Administered 2019-06-27 – 2019-06-30 (×6): 0.25 mg via RESPIRATORY_TRACT
  Filled 2019-06-26 (×6): qty 2

## 2019-06-26 MED ORDER — BRIMONIDINE TARTRATE 0.15 % OP SOLN
2.0000 [drp] | Freq: Two times a day (BID) | OPHTHALMIC | Status: DC
  Administered 2019-06-27 – 2019-06-30 (×4): 2 [drp] via OPHTHALMIC
  Filled 2019-06-26: qty 5

## 2019-06-26 MED ORDER — ONDANSETRON HCL 4 MG PO TABS: 4.0000 mg | ORAL_TABLET | Freq: Four times a day (QID) | ORAL | Status: DC | PRN

## 2019-06-26 MED ORDER — VITAMIN D 25 MCG (1000 UNIT) PO TABS
1000.0000 [IU] | ORAL_TABLET | Freq: Every day | ORAL | Status: DC
  Administered 2019-06-27 – 2019-06-30 (×3): 1000 [IU] via ORAL
  Filled 2019-06-26 (×4): qty 1

## 2019-06-26 MED ORDER — MAGNESIUM OXIDE 400 (241.3 MG) MG PO TABS
400.0000 mg | ORAL_TABLET | Freq: Every day | ORAL | Status: DC
  Administered 2019-06-27 – 2019-06-30 (×3): 400 mg via ORAL
  Filled 2019-06-26 (×3): qty 1

## 2019-06-26 MED ORDER — ALBUTEROL SULFATE (2.5 MG/3ML) 0.083% IN NEBU
2.5000 mg | INHALATION_SOLUTION | Freq: Four times a day (QID) | RESPIRATORY_TRACT | Status: DC | PRN
  Administered 2019-06-29: 2.5 mg via RESPIRATORY_TRACT
  Filled 2019-06-26: qty 3

## 2019-06-26 MED ORDER — MORPHINE SULFATE (PF) 2 MG/ML IV SOLN
2.0000 mg | INTRAVENOUS | Status: DC | PRN
  Administered 2019-06-26: 2 mg via INTRAVENOUS
  Filled 2019-06-26: qty 1

## 2019-06-26 MED ORDER — BUDESONIDE 3 MG PO CPEP
9.0000 mg | ORAL_CAPSULE | Freq: Every day | ORAL | Status: DC
  Administered 2019-06-27 – 2019-06-30 (×3): 9 mg via ORAL
  Filled 2019-06-26 (×5): qty 3

## 2019-06-26 MED ORDER — DOCUSATE SODIUM 100 MG PO CAPS
100.0000 mg | ORAL_CAPSULE | Freq: Two times a day (BID) | ORAL | Status: DC
  Administered 2019-06-26 – 2019-06-30 (×7): 100 mg via ORAL
  Filled 2019-06-26 (×7): qty 1

## 2019-06-26 MED ORDER — POTASSIUM CHLORIDE CRYS ER 10 MEQ PO TBCR
10.0000 meq | EXTENDED_RELEASE_TABLET | Freq: Two times a day (BID) | ORAL | Status: DC
  Administered 2019-06-26 – 2019-06-30 (×7): 10 meq via ORAL
  Filled 2019-06-26 (×7): qty 1

## 2019-06-26 MED ORDER — FUROSEMIDE 40 MG PO TABS
40.0000 mg | ORAL_TABLET | Freq: Every day | ORAL | Status: DC
  Administered 2019-06-27 – 2019-06-30 (×3): 40 mg via ORAL
  Filled 2019-06-26 (×3): qty 1

## 2019-06-26 NOTE — ED Notes (Signed)
ED TO INPATIENT HANDOFF REPORT  ED Nurse Name and Phone #: Larene Beach and Caryl Pina, [place] Name/Age/Gender Lisa [name] 78 y.o. female Room/Bed: ED01A/ED01A  Code Status   Code Status: Prior  Home/SNF/Other Home Patient oriented to: self, place, time and situation Is this baseline? Yes   Triage Complete: Triage complete  Chief Complaint fall ems  Triage Note Pt in via [facility] from [facility], pt with witnessed mechanical fall, landing on left side.  Presents with left hip pain.  32mcg Fentanyl given prior to arrival via EMS.  A/Ox4, vitals WDL.   Allergies Allergies  Allergen Reactions  . Shellfish-Derived Products Swelling  . Aspirin Other (See Comments)    Pt states, "I'm a free bleeder."  . Diazepam Other (See Comments)    Affected her hearing and sight  . Dairy Aid [Lactase] Nausea And Vomiting    Level of Care/Admitting Diagnosis ED Disposition    ED Disposition Condition [facility] Area: [place] [[id number]]  Level of Care: Med-Surg [16]  Covid Evaluation: Person Under Investigation (PUI)  [facility]: [id number]  Admitting Physician: Nicholes Mango [[id number]]  Attending Physician: Nicholes Mango [[id number]]  Estimated length of stay: 3 - 4 days  Certification:: I certify this patient will need inpatient services for at least 2 midnights  PT Class (Do Not Modify): Inpatient [101]  PT Acc Code (Do Not Modify): Private [1]       B Medical/Surgery History Past Medical History:  Diagnosis Date  . Arthritis   . Asthma   . Cancer (HCC)    anal  . Carotid stenosis   . Colon polyp 05/22/2013  . Foot mass   . Glaucoma (increased eye pressure)   . Headache   . Heart murmur   . Hypercholesterolemia   . Hyperlipidemia   . Irregular heartbeat   . Localized primary osteoarthritis of lower leg   . Macular degeneration   . Mitral valve prolapse   . Neck pain   . Osteopenia   . Sarcoidosis   . Sarcoidosis   . Shortness  of breath dyspnea    with exertion  . TIA (transient ischemic attack)    Past Surgical History:  Procedure Laterality Date  . ABDOMINAL HYSTERECTOMY    . cataract surgery  08/18/09   left eye  . COLONOSCOPY  05/22/2013  . COLONOSCOPY WITH PROPOFOL N/A 09/15/2015   Procedure: COLONOSCOPY WITH PROPOFOL;  Surgeon: Hulen Luster, MD;  Location: [facility] ENDOSCOPY;  Service: Gastroenterology;  Laterality: N/A;  . COLONOSCOPY WITH PROPOFOL N/A 04/17/2019   Procedure: COLONOSCOPY WITH PROPOFOL;  Surgeon: Lucilla Lame, MD;  Location: [facility] ENDOSCOPY;  Service: Endoscopy;  Laterality: N/A;  . DILATION AND CURETTAGE OF UTERUS    . ESOPHAGOGASTRODUODENOSCOPY (EGD) WITH PROPOFOL N/A 04/17/2019   Procedure: ESOPHAGOGASTRODUODENOSCOPY (EGD) WITH PROPOFOL;  Surgeon: Lucilla Lame, MD;  Location: [facility] ENDOSCOPY;  Service: Endoscopy;  Laterality: N/A;  . EYE SURGERY Bilateral    cataract extraction  . HEMORRHOID SURGERY N/A 07/24/2015   Procedure: HEMORRHOIDECTOMY/INTERNAL AND EXTERNAL;  Surgeon: Leonie Green, MD;  Location: [facility] ORS;  Service: General;  Laterality: N/A;  . hysterectomy with ovaries intact    . MASS EXCISION N/A 08/21/2015   Procedure: EXCISION MASS/ ANAL MASS;  Surgeon: Leonie Green, MD;  Location: [facility] ORS;  Service: General;  Laterality: N/A;  . TONSILLECTOMY    . TUMOR EXCISION N/A 09/16/2015   Procedure: TUMOR EXCISION RECTAL;  Surgeon: Hillery Aldo  Tamala Julian, MD;  Location: [facility] ORS;  Service: General;  Laterality: N/A;     A IV Location/Drains/Wounds Patient Lines/Drains/Airways Status   Active Line/Drains/Airways    Name:   Placement date:   Placement time:   Site:   Days:   Incision (Closed) 07/24/15 Perineum   07/24/15    1334     1433   Incision (Closed) 08/21/15 Rectum Other (Comment)   08/21/15    0802     1405   Incision (Closed) 09/16/15 Rectum   09/16/15    1123     1379          Intake/Output Last 24 hours No intake or output data in the 24 hours ending  06/26/19 1251  Labs/Imaging Results for orders placed or performed during the hospital encounter of 06/26/19 (from the past 48 hour(s))  CBC WITH DIFFERENTIAL     Status: Abnormal   Collection Time: 06/26/19 10:00 AM  Result Value Ref Range   WBC 10.1 4.0 - 10.5 K/uL   RBC 4.31 3.87 - 5.11 MIL/uL   Hemoglobin 13.6 12.0 - 15.0 g/dL   HCT 42.3 36.0 - 46.0 %   MCV 98.1 80.0 - 100.0 fL   MCH 31.6 26.0 - 34.0 pg   MCHC 32.2 30.0 - 36.0 g/dL   RDW 13.0 11.5 - 15.5 %   Platelets 288 150 - 400 K/uL   nRBC 0.0 0.0 - 0.2 %   Neutrophils Relative % 78 %   Neutro Abs 7.9 (H) 1.7 - 7.7 K/uL   Lymphocytes Relative 12 %   Lymphs Abs 1.2 0.7 - 4.0 K/uL   Monocytes Relative 7 %   Monocytes Absolute 0.7 0.1 - 1.0 K/uL   Eosinophils Relative 0 %   Eosinophils Absolute 0.0 0.0 - 0.5 K/uL   Basophils Relative 1 %   Basophils Absolute 0.1 0.0 - 0.1 K/uL   Immature Granulocytes 2 %   Abs Immature Granulocytes 0.20 (H) 0.00 - 0.07 K/uL    Comment: Performed at [facility], [address] [place], [place] [address]  Protime-INR     Status: None   Collection Time: 06/26/19 10:00 AM  Result Value Ref Range   Prothrombin Time 14.2 11.4 - 15.2 seconds   INR 1.1 0.8 - 1.2    Comment: (NOTE) INR goal varies based on device and disease states. Performed at [facility], [address] [place], [place] [address]   Comprehensive metabolic panel     Status: Abnormal   Collection Time: 06/26/19 10:00 AM  Result Value Ref Range   Sodium 141 135 - 145 mmol/L   Potassium 4.0 3.5 - 5.1 mmol/L    Comment: HEMOLYSIS AT THIS LEVEL MAY AFFECT RESULT   Chloride 105 98 - 111 mmol/L   CO2 25 22 - 32 mmol/L   Glucose, Bld 105 (H) 70 - 99 mg/dL   BUN 23 8 - 23 mg/dL   Creatinine, Ser 0.60 0.44 - 1.00 mg/dL   Calcium 9.1 8.9 - 10.3 mg/dL   Total Protein 6.6 6.5 - 8.1 g/dL   Albumin 3.6 3.5 - 5.0 g/dL   AST 23 15 - 41 U/L   ALT 27 0 - 44 U/L   Alkaline Phosphatase 48 38 - 126 U/L   Total  Bilirubin 0.7 0.3 - 1.2 mg/dL   GFR calc non Af Amer >60 >60 mL/min   GFR calc Af Amer >60 >60 mL/min   Anion gap 11 5 - 15  Comment: Performed at [facility], [address] [place], [place] [address]  APTT     Status: Abnormal   Collection Time: 06/26/19 10:00 AM  Result Value Ref Range   aPTT 39 (H) 24 - 36 seconds    Comment:        IF BASELINE aPTT IS ELEVATED, SUGGEST PATIENT RISK ASSESSMENT BE USED TO DETERMINE APPROPRIATE ANTICOAGULANT THERAPY. Performed at [facility], [address] [place], [place] [address]   SARS Coronavirus 2 [facility] order, Performed in [facility] hospital lab) Nasopharyngeal Nasopharyngeal Swab     Status: None   Collection Time: 06/26/19 11:18 AM   Specimen: Nasopharyngeal Swab  Result Value Ref Range   SARS Coronavirus 2 NEGATIVE NEGATIVE    Comment: (NOTE) If result is NEGATIVE SARS-CoV-2 target nucleic acids are NOT DETECTED. The SARS-CoV-2 RNA is generally detectable in upper and lower  respiratory specimens during the acute phase of infection. The lowest  concentration of SARS-CoV-2 viral copies this assay can detect is 250  copies / mL. A negative result does not preclude SARS-CoV-2 infection  and should not be used as the sole basis for treatment or other  patient management decisions.  A negative result may occur with  improper specimen collection / handling, submission of specimen other  than nasopharyngeal swab, presence of viral mutation(s) within the  areas targeted by this assay, and inadequate number of viral copies  (<250 copies / mL). A negative result must be combined with clinical  observations, patient history, and epidemiological information. If result is POSITIVE SARS-CoV-2 target nucleic acids are DETECTED. The SARS-CoV-2 RNA is generally detectable in upper and lower  respiratory specimens dur ing the acute phase of infection.  Positive  results are indicative of active infection with  SARS-CoV-2.  Clinical  correlation with patient history and other diagnostic information is  necessary to determine patient infection status.  Positive results do  not rule out bacterial infection or co-infection with other viruses. If result is PRESUMPTIVE POSTIVE SARS-CoV-2 nucleic acids MAY BE PRESENT.   A presumptive positive result was obtained on the submitted specimen  and confirmed on repeat testing.  While 2019 novel coronavirus  (SARS-CoV-2) nucleic acids may be present in the submitted sample  additional confirmatory testing may be necessary for epidemiological  and / or clinical management purposes  to differentiate between  SARS-CoV-2 and other Sarbecovirus currently known to infect humans.  If clinically indicated additional testing with an alternate test  methodology [phone]) is advised. The SARS-CoV-2 RNA is generally  detectable in upper and lower respiratory sp ecimens during the acute  phase of infection. The expected result is Negative. Fact Sheet for Patients:  StrictlyIdeas.no Fact Sheet for Healthcare Providers: BankingDealers.co.za This test is not yet approved or cleared by the [place] FDA and has been authorized for detection and/or diagnosis of SARS-CoV-2 by FDA under an Emergency Use Authorization (EUA).  This EUA will remain in effect (meaning this test can be used) for the duration of the COVID-19 declaration under Section 564(b)(1) of the Act, 21 U.S.C. section 360bbb-3(b)(1), unless the authorization is terminated or revoked sooner. Performed at [facility], [address] [place], [place] [address]    Dg Chest 1 View  Result Date: 06/26/2019 CLINICAL DATA:  Fall, left leg pain EXAM: CHEST  1 VIEW COMPARISON:  09/13/2018 FINDINGS: Cardiomegaly. There are bilateral calcified mediastinal and hilar lymph nodes as well as calcified pulmonary nodules. There is architectural distortion of the  upper lobes of the lungs. The visualized skeletal structures are unremarkable. IMPRESSION: 1.  No acute abnormality of the lungs. 2. There are bilateral calcified mediastinal and hilar lymph nodes as well as calcified pulmonary nodules. There is architectural distortion of the upper lobes of the lungs. 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 3.  Cardiomegaly. Electronically Signed   By: Eddie Candle M.D.   On: 06/26/2019 11:03   Dg Knee 2 Views Left  Result Date: 06/26/2019 CLINICAL DATA:  Fall, pain EXAM: LEFT KNEE - 1-2 VIEW COMPARISON:  None. FINDINGS: No fracture or dislocation of the left knee. Mild osteopenia. Joint spaces are well preserved. No knee joint effusion. Soft tissues are unremarkable. IMPRESSION: No fracture or dislocation of the left knee. Electronically Signed   By: Eddie Candle M.D.   On: 06/26/2019 11:06   Dg Hip Unilat With Pelvis 2-3 Views Left  Result Date: 06/26/2019 CLINICAL DATA:  Fall, left hip pain EXAM: DG HIP (WITH OR WITHOUT PELVIS) 2-3V LEFT COMPARISON:  None. FINDINGS: There is a displaced and foreshortened transcervical fracture of the left femoral neck. The pelvis and right hip are unremarkable in single frontal view. Mild osteopenia. IMPRESSION: There is a displaced and foreshortened transcervical fracture of the left femoral neck. Electronically Signed   By: Eddie Candle M.D.   On: 06/26/2019 11:05    Pending Labs Unresulted Labs (From admission, onward)    Start     Ordered   06/26/19 1242  Type and screen  Once,   STAT     06/26/19 1242          Vitals/Pain Today's Vitals   06/26/19 1100 06/26/19 1115 06/26/19 1116 06/26/19 1236  BP:  116/70    Pulse: 61 64    Resp: 18 (!) 21    Temp:      TempSrc:      SpO2: 95% 94%    Weight:      Height:      PainSc:   1  2     Isolation Precautions No active isolations  Medications Medications  fentaNYL (SUBLIMAZE) injection 50 mcg  (50 mcg Intravenous Given 06/26/19 1135)    Mobility Pt ambulatory at baseline, however pt being admitted for L hip fracture Low fall risk     R Recommendations: See Admitting Provider Note  Report given to:   Additional Notes:

## 2019-06-26 NOTE — ED Triage Notes (Signed)
Pt in via [facility] from [facility], pt with witnessed mechanical fall, landing on left side.  Presents with left hip pain.  61mcg Fentanyl given prior to arrival via EMS.  A/Ox4, vitals WDL.

## 2019-06-26 NOTE — Progress Notes (Signed)
Family Meeting Note  Advance Directive:yes  Today a meeting took place with the Patient.   The following clinical team members were present during this meeting:MD  The following were discussed:Patient's diagnosis: Fall with acute left hip fracture, chronic atrial fibrillation on Eliquis, sarcoidosis, hypertension, hyperlipidemia, insomnia will be admitted to the hospital treatment plan of care discussed in detail with the patient.  She verbalized understanding of the plan.   , Patient's progosis: Unable to determine and Goals for treatment: Full Code  Daughter- [employer] power of attorney  Additional follow-up to be provided: Hospitalist, orthopedics  Time spent during discussion:17 min  Nicholes Mango, MD

## 2019-06-26 NOTE — ED Provider Notes (Signed)
[facility] Emergency Department Provider Note   ____________________________________________    I have reviewed the triage vital signs and the nursing notes.   HISTORY  Chief Complaint Hip Pain     HPI Lisa [name] is a 78 y.o. female who presents after mechanical fall.  Patient reports that she was at the store and lost her balance and fell onto her left hip.  She complains of significant pain when she tries to move it, she has not been able to stand on it.  EMS brought her from the site gave 75 mcg of fentanyl.  She denies other injuries.  She does report that she recently had injections to her left knee because of pain in that area, she reports it does hurt now she is not sure if it hurts more than prior to the fall.  She reports she did not hit her head chest or abdomen.  Her back does not hurt.  Medical records demonstrates she is on Eliquis  Past Medical History:  Diagnosis Date  . Arthritis   . Asthma   . Cancer (HCC)    anal  . Carotid stenosis   . Colon polyp 05/22/2013  . Foot mass   . Glaucoma (increased eye pressure)   . Headache   . Heart murmur   . Hypercholesterolemia   . Hyperlipidemia   . Irregular heartbeat   . Localized primary osteoarthritis of lower leg   . Macular degeneration   . Mitral valve prolapse   . Neck pain   . Osteopenia   . Sarcoidosis   . Sarcoidosis   . Shortness of breath dyspnea    with exertion  . TIA (transient ischemic attack)     Patient Active Problem List   Diagnosis Date Noted  . Blood in stool   . Gastritis without bleeding   . Rectal polyp   . Polyp of transverse colon   . GI bleed 04/16/2019  . SOB (shortness of breath) 11/01/2018  . Change in bowel habits 06/07/2017  . External hemorrhoid 06/07/2017  . History of laparoscopic cholecystectomy 06/07/2017  . Cholecystitis, chronic 05/26/2017  . History of rectal or anal cancer 05/26/2017  . History of TIA (transient ischemic attack)  05/26/2017  . Pulmonary HTN ([place]) 05/26/2017  . PAC (premature atrial contraction) 09/15/2016  . Asthma without status asthmaticus 09/25/2015  . Cancer of anal margin 08/20/2015  . Chest pain 07/23/2015  . Chronic rhinitis 06/04/2015  . Near syncope 09/12/2014  . Primary osteoarthritis of both knees 08/16/2014  . Muscle strain 05/17/2014  . Cephalalgia 04/10/2014  . LBP (low back pain) 04/10/2014  . Cervical pain 04/10/2014  . Decreased potassium in the blood 03/25/2014  . Arthritis of knee, degenerative 01/25/2014  . 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 Open angle with borderline findings, low risk 07/04/2013  . Foot mass 06/05/2013  . Arthritis of foot, degenerative 06/05/2013  . Carotid artery narrowing 03/30/2013  . Cardiac murmur 03/07/2013  . Persons encountering health services in other specified circumstances 10/26/2012  . Referral of patient 10/26/2012  . Airway hyperreactivity 08/25/2012  . Hypercholesterolemia 08/25/2012  . Degeneration macular 08/25/2012  . Carotid stenosis, bilateral 07/21/2012  . Idiopathic localized osteoarthropathy 02/17/2012  . Asthma, mild persistent 02/14/2012  . URI (upper respiratory infection) 11/15/2011  . ALLERGY, FOOD 05/20/2010  . PULMONARY NODULE, LEFT UPPER LOBE 08/06/2008  . Disease of lung 08/06/2008  . Respiratory insufficiency 08/06/2008  . Sarcoidosis with  airway involvement  08/22/2007    Past Surgical History:  Procedure Laterality Date  . ABDOMINAL HYSTERECTOMY    . cataract surgery  08/18/09   left eye  . COLONOSCOPY  05/22/2013  . COLONOSCOPY WITH PROPOFOL N/A 09/15/2015   Procedure: COLONOSCOPY WITH PROPOFOL;  Surgeon: Hulen Luster, MD;  Location: [facility] ENDOSCOPY;  Service: Gastroenterology;  Laterality: N/A;  . COLONOSCOPY WITH PROPOFOL N/A 04/17/2019   Procedure: COLONOSCOPY WITH PROPOFOL;  Surgeon: Lucilla Lame, MD;  Location: [facility] ENDOSCOPY;  Service: Endoscopy;  Laterality: N/A;  .  DILATION AND CURETTAGE OF UTERUS    . ESOPHAGOGASTRODUODENOSCOPY (EGD) WITH PROPOFOL N/A 04/17/2019   Procedure: ESOPHAGOGASTRODUODENOSCOPY (EGD) WITH PROPOFOL;  Surgeon: Lucilla Lame, MD;  Location: [facility] ENDOSCOPY;  Service: Endoscopy;  Laterality: N/A;  . EYE SURGERY Bilateral    cataract extraction  . HEMORRHOID SURGERY N/A 07/24/2015   Procedure: HEMORRHOIDECTOMY/INTERNAL AND EXTERNAL;  Surgeon: Leonie Green, MD;  Location: [facility] ORS;  Service: General;  Laterality: N/A;  . hysterectomy with ovaries intact    . MASS EXCISION N/A 08/21/2015   Procedure: EXCISION MASS/ ANAL MASS;  Surgeon: Leonie Green, MD;  Location: [facility] ORS;  Service: General;  Laterality: N/A;  . TONSILLECTOMY    . TUMOR EXCISION N/A 09/16/2015   Procedure: TUMOR EXCISION RECTAL;  Surgeon: Leonie Green, MD;  Location: [facility] ORS;  Service: General;  Laterality: N/A;    Prior to Admission medications   Medication Sig Start Date End Date Taking? Authorizing Provider  albuterol (PROAIR HFA) 108 (90 Base) MCG/ACT inhaler Inhale 2 puffs into the lungs every 6 (six) hours as needed for wheezing or shortness of breath. 06/10/16  Yes Tanda Rockers, MD  ALPHAGAN P 0.15 % ophthalmic solution Place 2 drops into the left eye 2 (two) times daily.  06/23/12  Yes [provider]  apixaban (ELIQUIS) 5 MG TABS tablet Take 5 mg by mouth 2 (two) times daily.   Yes [provider]  beclomethasone (QVAR) 80 MCG/ACT inhaler Inhale 1 puff into the lungs 2 (two) times a day. 04/05/19  Yes [provider]  budesonide (ENTOCORT EC) 3 MG 24 hr capsule Take 9 mg by mouth daily.   Yes [provider]  cholecalciferol (VITAMIN D) 1000 UNITS tablet Take 1,000 Units by mouth daily.    Yes [provider]  furosemide (LASIX) 40 MG tablet Take 40 mg by mouth daily.    Yes [provider]  lidocaine (LIDODERM) 5 % Place 1 patch onto the skin daily as needed. Remove & Discard patch  within 12 hours or as directed by MD   Yes [provider]  lidocaine (XYLOCAINE) 5 % ointment Apply topically 2 (two) times daily. 05/06/19  Yes [provider]  magnesium oxide (MAG-OX) 400 MG tablet Take 400 mg by mouth daily. 08/06/18  Yes [provider]  Multiple Vitamin (MULTIVITAMIN WITH MINERALS) TABS Take 1 tablet by mouth daily. Centrum Silver   Yes [provider]  Multiple Vitamins-Minerals (PRESERVISION AREDS PO) Take 1 capsule by mouth daily.    Yes [provider]  pantoprazole (PROTONIX) 40 MG tablet Take 1 tablet (40 mg total) by mouth daily. 04/18/19 04/17/20 Yes Epifanio Lesches, MD  potassium chloride (K-DUR) 10 MEQ tablet Take 10-20 mEq by mouth 2 (two) times daily.    Yes [provider]  rosuvastatin (CRESTOR) 5 MG tablet Take 5 mg by mouth every morning.    Yes [provider]  spironolactone (ALDACTONE) 50  MG tablet Take 50 mg by mouth daily.  08/28/18  Yes [provider]  zolpidem (AMBIEN) 5 MG tablet Take 5 mg by mouth at bedtime. 06/21/19  Yes [provider]     Allergies Shellfish-derived products, Aspirin, Diazepam, and Dairy aid [lactase]  Family History  Problem Relation Age of Onset  . Cancer Mother   . CAD Father     Social History Social History   Tobacco Use  . Smoking status: Never Smoker  . Smokeless tobacco: Never Used  Substance Use Topics  . Alcohol use: No  . Drug use: No    Review of Systems  Constitutional: No dizziness Eyes: No visual changes.  ENT: No neck pain Cardiovascular: Denies chest pain. Respiratory: Denies shortness of breath. Gastrointestinal: No abdominal pain.  No nausea, no vomiting.   Genitourinary: Pain to the groin, no dysuria Musculoskeletal: As above Skin: Negative for rash. Neurological: Negative for headaches or weakness   ____________________________________________   PHYSICAL EXAM:  VITAL SIGNS: ED Triage Vitals   Enc Vitals Group     BP 06/26/19 0944 (!) 123/94     Pulse Rate 06/26/19 0944 68     Resp 06/26/19 0944 17     Temp 06/26/19 0944 97.8 F (36.6 C)     Temp Source 06/26/19 0944 Oral     SpO2 --      Weight 06/26/19 0945 77.1 kg (170 lb)     Height 06/26/19 0945 1.702 m (5\' 7" )     Head Circumference --      Peak Flow --      Pain Score 06/26/19 0945 3     Pain Loc --      Pain Edu? --      Excl. in [place]? --     Constitutional: Alert and oriented. Eyes: Conjunctivae are normal.  Head: Atraumatic. Nose: No congestion/rhinnorhea. Mouth/Throat: Mucous membranes are moist.   Neck:  Painless ROM, no vertebral tenderness palpation Cardiovascular: Normal rate, regular rhythm. Grossly normal heart sounds.  Good peripheral circulation. Respiratory: Normal respiratory effort.  No retractions. Lungs CTAB. Gastrointestinal: Soft and nontender. No distention.  No CVA tenderness. Genitourinary: deferred Musculoskeletal: Pain with axial load on the left hip, although no significant shortening, mild swelling to the left knee no evidence of bony normality.  No vertebral has palpation.  Other extremities and chest wall normal Neurologic:  Normal speech and language. No gross focal neurologic deficits are appreciated.  Skin:  Skin is warm, dry and intact. No rash noted. Psychiatric: Mood and affect are normal. Speech and behavior are normal.  ____________________________________________   LABS (all labs ordered are listed, but only abnormal results are displayed)  Labs Reviewed  CBC WITH DIFFERENTIAL/PLATELET - Abnormal; Notable for the following components:      Result Value   Neutro Abs 7.9 (*)    Abs Immature Granulocytes 0.20 (*)    All other components within normal limits  COMPREHENSIVE METABOLIC PANEL - Abnormal; Notable for the following components:   Glucose, Bld 105 (*)    All other components within normal limits  APTT - Abnormal; Notable for the following components:   aPTT 39  (*)    All other components within normal limits  SARS CORONAVIRUS 2 (HOSPITAL ORDER, [facility])  PROTIME-INR  TYPE AND SCREEN   ____________________________________________  EKG  ED ECG REPORT I, Lavonia Drafts, the attending physician, personally viewed and interpreted this ECG.  Date: 06/26/2019  Rhythm: normal sinus rhythm  QRS Axis: normal Intervals: normal ST/T Wave abnormalities: Nonspecific ST changes Narrative Interpretation: no evidence of acute ischemia  ____________________________________________  RADIOLOGY  Hip x-ray ____________________________________________   PROCEDURES  Procedure(s) performed: No  Procedures   Critical Care performed: No ____________________________________________   INITIAL IMPRESSION / ASSESSMENT AND PLAN / ED COURSE  Pertinent labs & imaging results that were available during my care of the patient were reviewed by me and considered in my medical decision making (see chart for details).  Patient presents after mechanical fall, suspicious for hip fracture versus pelvis fracture.  She is anticoagulated but reports she has not taken her Eliquis this morning.  Patient treated with IV fentanyl  X-ray confirms left hip fracture knee x-ray is normal  Notify Dr. Mack Guise who is currently operating, admitted to Dr. Margaretmary Eddy of hospitalist service    ____________________________________________   FINAL CLINICAL IMPRESSION(S) / ED DIAGNOSES  Final diagnoses:  Closed fracture of left hip, initial encounter [facility])        Note:  This document was prepared using Dragon voice recognition software and may include unintentional dictation errors.   Lavonia Drafts, MD 06/26/19 1214

## 2019-06-26 NOTE — H&P (Signed)
[facility] NAME: Lisa [name]    MR#:  [id number]  DATE OF BIRTH:  04-12-1941  DATE OF ADMISSION:  06/26/2019  PRIMARY CARE PHYSICIAN: Clarisse Gouge, MD   REQUESTING/REFERRING PHYSICIAN: [email or web address]  CHIEF COMPLAINT:  Fall  HISTORY OF PRESENT ILLNESS:  Lisa [name]  is a 78 y.o. female with a known history of hyperlipidemia, atrial fibrillation on Eliquis, sarcoidosis is brought into the ED after she sustained a mechanical fall.  X-ray has revealed left hip fracture.  Patient takes Eliquis and last dose was yesterday.  During my examination patient denies any chest pain or shortness of breath or palpitations.  Denies any dizzy spells.  Pain is manageable after pain medication.  PAST MEDICAL HISTORY:   Past Medical History:  Diagnosis Date  . Arthritis   . Asthma   . Cancer (HCC)    anal  . Carotid stenosis   . Colon polyp 05/22/2013  . Foot mass   . Glaucoma (increased eye pressure)   . Headache   . Heart murmur   . Hypercholesterolemia   . Hyperlipidemia   . Irregular heartbeat   . Localized primary osteoarthritis of lower leg   . Macular degeneration   . Mitral valve prolapse   . Neck pain   . Osteopenia   . Sarcoidosis   . Sarcoidosis   . Shortness of breath dyspnea    with exertion  . TIA (transient ischemic attack)     PAST SURGICAL HISTOIRY:   Past Surgical History:  Procedure Laterality Date  . ABDOMINAL HYSTERECTOMY    . cataract surgery  08/18/09   left eye  . COLONOSCOPY  05/22/2013  . COLONOSCOPY WITH PROPOFOL N/A 09/15/2015   Procedure: COLONOSCOPY WITH PROPOFOL;  Surgeon: Hulen Luster, MD;  Location: [facility] ENDOSCOPY;  Service: Gastroenterology;  Laterality: N/A;  . COLONOSCOPY WITH PROPOFOL N/A 04/17/2019   Procedure: COLONOSCOPY WITH PROPOFOL;  Surgeon: Lucilla Lame, MD;  Location: [facility] ENDOSCOPY;  Service: Endoscopy;  Laterality: N/A;  . DILATION AND CURETTAGE OF UTERUS    .  ESOPHAGOGASTRODUODENOSCOPY (EGD) WITH PROPOFOL N/A 04/17/2019   Procedure: ESOPHAGOGASTRODUODENOSCOPY (EGD) WITH PROPOFOL;  Surgeon: Lucilla Lame, MD;  Location: [facility] ENDOSCOPY;  Service: Endoscopy;  Laterality: N/A;  . EYE SURGERY Bilateral    cataract extraction  . HEMORRHOID SURGERY N/A 07/24/2015   Procedure: HEMORRHOIDECTOMY/INTERNAL AND EXTERNAL;  Surgeon: Leonie Green, MD;  Location: [facility] ORS;  Service: General;  Laterality: N/A;  . hysterectomy with ovaries intact    . MASS EXCISION N/A 08/21/2015   Procedure: EXCISION MASS/ ANAL MASS;  Surgeon: Leonie Green, MD;  Location: [facility] ORS;  Service: General;  Laterality: N/A;  . TONSILLECTOMY    . TUMOR EXCISION N/A 09/16/2015   Procedure: TUMOR EXCISION RECTAL;  Surgeon: Leonie Green, MD;  Location: [facility] ORS;  Service: General;  Laterality: N/A;    SOCIAL HISTORY:   Social History   Tobacco Use  . Smoking status: Never Smoker  . Smokeless tobacco: Never Used  Substance Use Topics  . Alcohol use: No    FAMILY HISTORY:   Family History  Problem Relation Age of Onset  . Cancer Mother   . CAD Father     DRUG ALLERGIES:   Allergies  Allergen Reactions  . Shellfish-Derived Products Swelling  . Aspirin Other (See Comments)    Pt states, "I'm a free bleeder."  . Diazepam Other (See Comments)    Affected her hearing  and sight  . Dairy Aid [Lactase] Nausea And Vomiting    REVIEW OF SYSTEMS:  CONSTITUTIONAL: No fever, fatigue or weakness.  EYES: No blurred or double vision.  EARS, NOSE, AND THROAT: No tinnitus or ear pain.  RESPIRATORY: No cough, shortness of breath, wheezing or hemoptysis.  CARDIOVASCULAR: No chest pain, orthopnea, edema.  GASTROINTESTINAL: No nausea, vomiting, diarrhea or abdominal pain.  GENITOURINARY: No dysuria, hematuria.  ENDOCRINE: No polyuria, nocturia,  HEMATOLOGY: No anemia, easy bruising or bleeding SKIN: No rash or lesion. MUSCULOSKELETAL: Left hip pain NEUROLOGIC: No  tingling, numbness, weakness.  PSYCHIATRY: No anxiety or depression.   MEDICATIONS AT HOME:   Prior to Admission medications   Medication Sig Start Date End Date Taking? Authorizing Provider  albuterol (PROAIR HFA) 108 (90 Base) MCG/ACT inhaler Inhale 2 puffs into the lungs every 6 (six) hours as needed for wheezing or shortness of breath. 06/10/16  Yes Tanda Rockers, MD  ALPHAGAN P 0.15 % ophthalmic solution Place 2 drops into the left eye 2 (two) times daily.  06/23/12  Yes [provider]  apixaban (ELIQUIS) 5 MG TABS tablet Take 5 mg by mouth 2 (two) times daily.   Yes [provider]  beclomethasone (QVAR) 80 MCG/ACT inhaler Inhale 1 puff into the lungs 2 (two) times a day. 04/05/19  Yes [provider]  budesonide (ENTOCORT EC) 3 MG 24 hr capsule Take 9 mg by mouth daily.   Yes [provider]  cholecalciferol (VITAMIN D) 1000 UNITS tablet Take 1,000 Units by mouth daily.    Yes [provider]  furosemide (LASIX) 40 MG tablet Take 40 mg by mouth daily.    Yes [provider]  lidocaine (LIDODERM) 5 % Place 1 patch onto the skin daily as needed. Remove & Discard patch within 12 hours or as directed by MD   Yes [provider]  lidocaine (XYLOCAINE) 5 % ointment Apply topically 2 (two) times daily. 05/06/19  Yes [provider]  magnesium oxide (MAG-OX) 400 MG tablet Take 400 mg by mouth daily. 08/06/18  Yes [provider]  Multiple Vitamin (MULTIVITAMIN WITH MINERALS) TABS Take 1 tablet by mouth daily. Centrum Silver   Yes [provider]  Multiple Vitamins-Minerals (PRESERVISION AREDS PO) Take 1 capsule by mouth daily.    Yes [provider]  pantoprazole (PROTONIX) 40 MG tablet Take 1 tablet (40 mg total) by mouth daily. 04/18/19 04/17/20 Yes Epifanio Lesches, MD  potassium chloride (K-DUR) 10 MEQ tablet Take 10-20 mEq by mouth 2 (two) times daily.    Yes [provider]   rosuvastatin (CRESTOR) 5 MG tablet Take 5 mg by mouth every morning.    Yes [provider]  spironolactone (ALDACTONE) 50 MG tablet Take 50 mg by mouth daily.  08/28/18  Yes [provider]  zolpidem (AMBIEN) 5 MG tablet Take 5 mg by mouth at bedtime. 06/21/19  Yes [provider]      VITAL SIGNS:  Blood pressure 120/79, pulse 70, temperature (!) 97.5 F (36.4 C), temperature source Oral, resp. rate 20, height 5\' 7"  (1.702 m), weight 77.1 kg, SpO2 93 %.  PHYSICAL EXAMINATION:  GENERAL:  78 y.o.-year-old patient lying in the bed with no acute distress.  EYES: Pupils equal, round, reactive to light and accommodation. No scleral icterus. Extraocular muscles intact.  HEENT: Head atraumatic, normocephalic. Oropharynx and nasopharynx clear.  NECK:  Supple, no jugular venous distention. No thyroid enlargement, no tenderness.  LUNGS: Normal breath sounds bilaterally,  no wheezing, rales,rhonchi or crepitation. No use of accessory muscles of respiration.  CARDIOVASCULAR: S1, S2 normal. No murmurs, rubs, or gallops.  ABDOMEN: Soft, nontender, nondistended. Bowel sounds present. EXTREMITIES: Left hip is tender and externally rotated NEUROLOGIC: Cranial nerves II through XII are intact. Muscle strength 5/5 in all extremities. Sensation intact. Gait not checked.  PSYCHIATRIC: The patient is alert and oriented x 3.  SKIN: No obvious rash, lesion, or ulcer.   LABORATORY PANEL:   CBC Recent Labs  Lab 06/26/19 1000  WBC 10.1  HGB 13.6  HCT 42.3  PLT 288   ------------------------------------------------------------------------------------------------------------------  Chemistries  Recent Labs  Lab 06/26/19 1000  NA 141  K 4.0  CL 105  CO2 25  GLUCOSE 105*  BUN 23  CREATININE 0.60  CALCIUM 9.1  AST 23  ALT 27  ALKPHOS 48  BILITOT 0.7    ------------------------------------------------------------------------------------------------------------------  Cardiac Enzymes No results for input(s): TROPONINI in the last 168 hours. ------------------------------------------------------------------------------------------------------------------  RADIOLOGY:  Dg Chest 1 View  Result Date: 06/26/2019 CLINICAL DATA:  Fall, left leg pain EXAM: CHEST  1 VIEW COMPARISON:  09/13/2018 FINDINGS: Cardiomegaly. There are bilateral calcified mediastinal and hilar lymph nodes as well as calcified pulmonary nodules. There is architectural distortion of the upper lobes of the lungs. The visualized skeletal structures are unremarkable. IMPRESSION: 1.  No acute abnormality of the lungs. 2. There are bilateral calcified mediastinal and hilar lymph nodes as well as calcified pulmonary nodules. There is architectural distortion of the upper lobes of the lungs. 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 3.  Cardiomegaly. Electronically Signed   By: Eddie Candle M.D.   On: 06/26/2019 11:03   Dg Knee 2 Views Left  Result Date: 06/26/2019 CLINICAL DATA:  Fall, pain EXAM: LEFT KNEE - 1-2 VIEW COMPARISON:  None. FINDINGS: No fracture or dislocation of the left knee. Mild osteopenia. Joint spaces are well preserved. No knee joint effusion. Soft tissues are unremarkable. IMPRESSION: No fracture or dislocation of the left knee. Electronically Signed   By: Eddie Candle M.D.   On: 06/26/2019 11:06   Dg Hip Unilat With Pelvis 2-3 Views Left  Result Date: 06/26/2019 CLINICAL DATA:  Fall, left hip pain EXAM: DG HIP (WITH OR WITHOUT PELVIS) 2-3V LEFT COMPARISON:  None. FINDINGS: There is a displaced and foreshortened transcervical fracture of the left femoral neck. The pelvis and right hip are unremarkable in single frontal view. Mild osteopenia. IMPRESSION: There is a displaced and foreshortened  transcervical fracture of the left femoral neck. Electronically Signed   By: Eddie Candle M.D.   On: 06/26/2019 11:05    EKG:   Orders placed or performed during the hospital encounter of 06/26/19  . EKG 12-Lead  . EKG 12-Lead    IMPRESSION AND PLAN:    #Left hip fracture Admit to orthopedics unit Patient is on Eliquis and last dose was yesterday.  Hold Eliquis Orthopedics consult placed to Dr. Mack Guise , he is aware of the consult Pain management as needed  #Chronic atrial fibrillation continue home medications and Eliquis is on hold  #Hyperlipidemia continue statin  #Chronic sarcoidosis continue close follow-up as recommended  #Insomnia Ambien   SCDs All the records are reviewed and case discussed with ED provider. Management plans discussed with the patient, family and they are in agreement.  CODE STATUS: fc  TOTAL TIME TAKING CARE OF THIS PATIENT: 43  minutes.   Note: This dictation was prepared with Dragon dictation  along with smaller phrase technology. Any transcriptional errors that result from this process are unintentional.  Nicholes Mango M.D on 06/26/2019 at 1:54 PM  Between 7am to 6pm - Pager - [phone]  After 6pm go to [email or web address] - password [email or web address] [facility]  [facility]  Office  [phone]  CC: Primary care physician; Clarisse Gouge, MD

## 2019-06-26 NOTE — Consult Note (Signed)
ORTHOPAEDIC CONSULTATION  REQUESTING PHYSICIAN: Nicholes Mango, MD  Chief Complaint: Left hip pain status post fall  HPI: Lisa [name] is a 78 y.o. female who is admitted after a fall earlier today.  Patient states she was at [employer].  She was holding bags that she left the store.  She believes the door may have knocked her off balance causing her to fall.  Patient takes Eliquis for atrial fibrillation.  She is admitted to the hospitalist service and orthopedics is consulted for management of the left femoral neck hip fracture diagnosed by x-ray upon arrival to the [facility] emergency department.  Patient complains of moderate left hip pain but denies numbness or tingling in the left lower extremity.  Past Medical History:  Diagnosis Date  . Arthritis   . Asthma   . Cancer (HCC)    anal  . Carotid stenosis   . Colon polyp 05/22/2013  . Foot mass   . Glaucoma (increased eye pressure)   . Headache   . Heart murmur   . Hypercholesterolemia   . Hyperlipidemia   . Irregular heartbeat   . Localized primary osteoarthritis of lower leg   . Macular degeneration   . Mitral valve prolapse   . Neck pain   . Osteopenia   . Sarcoidosis   . Sarcoidosis   . Shortness of breath dyspnea    with exertion  . TIA (transient ischemic attack)    Past Surgical History:  Procedure Laterality Date  . ABDOMINAL HYSTERECTOMY    . cataract surgery  08/18/09   left eye  . COLONOSCOPY  05/22/2013  . COLONOSCOPY WITH PROPOFOL N/A 09/15/2015   Procedure: COLONOSCOPY WITH PROPOFOL;  Surgeon: Hulen Luster, MD;  Location[facility] ENDOSCOPY;  Service: Gastroenterology;  Laterality: N/A;  . COLONOSCOPY WITH PROPOFOL N/A 04/17/2019   Procedure: COLONOSCOPY WITH PROPOFOL;  Surgeon: Lucilla Lame, MD;  Location: [facility] ENDOSCOPY;  Service: Endoscopy;  Laterality: N/A;  . DILATION AND CURETTAGE OF UTERUS    . ESOPHAGOGASTRODUODENOSCOPY (EGD) WITH PROPOFOL N/A 04/17/2019   Procedure: ESOPHAGOGASTRODUODENOSCOPY  (EGD) WITH PROPOFOL;  Surgeon: Lucilla Lame, MD;  Location: [facility] ENDOSCOPY;  Service: Endoscopy;  Laterality: N/A;  . EYE SURGERY Bilateral    cataract extraction  . HEMORRHOID SURGERY N/A 07/24/2015   Procedure: HEMORRHOIDECTOMY/INTERNAL AND EXTERNAL;  Surgeon: Leonie Green, MD;  Location: [facility] ORS;  Service: General;  Laterality: N/A;  . hysterectomy with ovaries intact    . MASS EXCISION N/A 08/21/2015   Procedure: EXCISION MASS/ ANAL MASS;  Surgeon: Leonie Green, MD;  Location: [facility] ORS;  Service: General;  Laterality: N/A;  . TONSILLECTOMY    . TUMOR EXCISION N/A 09/16/2015   Procedure: TUMOR EXCISION RECTAL;  Surgeon: Leonie Green, MD;  Location: [facility] ORS;  Service: General;  Laterality: N/A;   Social History   Socioeconomic History  . Marital status: Widowed    Spouse name: Not on file  . Number of children: 1  . Years of education: Not on file  . Highest education level: Not on file  Occupational History  . Occupation: retired from [employer]  . Financial resource strain: Not on file  . Food insecurity    Worry: Not on file    Inability: Not on file  . Transportation needs    Medical: Not on file    Non-medical: Not on file  Tobacco Use  . Smoking status: Never Smoker  . Smokeless tobacco: Never Used  Substance and Sexual Activity  .  Alcohol use: No  . Drug use: No  . Sexual activity: Not on file  Lifestyle  . Physical activity    Days per week: Not on file    Minutes per session: Not on file  . Stress: Not on file  Relationships  . Social Herbalist on phone: Not on file    Gets together: Not on file    Attends religious service: Not on file    Active member of club or organization: Not on file    Attends meetings of clubs or organizations: Not on file    Relationship status: Not on file  Other Topics Concern  . Not on file  Social History Narrative  . Not on file   Family History  Problem Relation Age of  Onset  . Cancer Mother   . CAD Father    Allergies  Allergen Reactions  . Shellfish-Derived Products Swelling  . Aspirin Other (See Comments)    Pt states, "I'm a free bleeder."  . Diazepam Other (See Comments)    Affected her hearing and sight  . Dairy Aid [Lactase] Nausea And Vomiting   Prior to Admission medications   Medication Sig Start Date End Date Taking? Authorizing Provider  albuterol (PROAIR HFA) 108 (90 Base) MCG/ACT inhaler Inhale 2 puffs into the lungs every 6 (six) hours as needed for wheezing or shortness of breath. 06/10/16  Yes Tanda Rockers, MD  ALPHAGAN P 0.15 % ophthalmic solution Place 2 drops into the left eye 2 (two) times daily.  06/23/12  Yes [provider]  apixaban (ELIQUIS) 5 MG TABS tablet Take 5 mg by mouth 2 (two) times daily.   Yes [provider]  beclomethasone (QVAR) 80 MCG/ACT inhaler Inhale 1 puff into the lungs 2 (two) times a day. 04/05/19  Yes [provider]  budesonide (ENTOCORT EC) 3 MG 24 hr capsule Take 9 mg by mouth daily.   Yes [provider]  cholecalciferol (VITAMIN D) 1000 UNITS tablet Take 1,000 Units by mouth daily.    Yes [provider]  furosemide (LASIX) 40 MG tablet Take 40 mg by mouth daily.    Yes [provider]  lidocaine (LIDODERM) 5 % Place 1 patch onto the skin daily as needed. Remove & Discard patch within 12 hours or as directed by MD   Yes [provider]  lidocaine (XYLOCAINE) 5 % ointment Apply topically 2 (two) times daily. 05/06/19  Yes [provider]  magnesium oxide (MAG-OX) 400 MG tablet Take 400 mg by mouth daily. 08/06/18  Yes [provider]  Multiple Vitamin (MULTIVITAMIN WITH MINERALS) TABS Take 1 tablet by mouth daily. Centrum Silver   Yes [provider]  Multiple Vitamins-Minerals (PRESERVISION AREDS PO) Take 1 capsule by mouth daily.    Yes [provider]  pantoprazole (PROTONIX) 40 MG tablet Take 1 tablet  (40 mg total) by mouth daily. 04/18/19 04/17/20 Yes Epifanio Lesches, MD  potassium chloride (K-DUR) 10 MEQ tablet Take 10-20 mEq by mouth 2 (two) times daily.    Yes [provider]  rosuvastatin (CRESTOR) 5 MG tablet Take 5 mg by mouth every morning.    Yes [provider]  spironolactone (ALDACTONE) 50 MG tablet Take 50 mg by mouth daily.  08/28/18  Yes [provider]  zolpidem (AMBIEN) 5 MG tablet Take 5 mg by mouth at bedtime. 06/21/19  Yes [provider]   Dg Chest 1 View  Result Date: 06/26/2019 CLINICAL  DATA:  Fall, left leg pain EXAM: CHEST  1 VIEW COMPARISON:  09/13/2018 FINDINGS: Cardiomegaly. There are bilateral calcified mediastinal and hilar lymph nodes as well as calcified pulmonary nodules. There is architectural distortion of the upper lobes of the lungs. The visualized skeletal structures are unremarkable. IMPRESSION: 1.  No acute abnormality of the lungs. 2. There are bilateral calcified mediastinal and hilar lymph nodes as well as calcified pulmonary nodules. There is architectural distortion of the upper lobes of the lungs. 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 3.  Cardiomegaly. Electronically Signed   By: Eddie Candle M.D.   On: 06/26/2019 11:03   Dg Knee 2 Views Left  Result Date: 06/26/2019 CLINICAL DATA:  Fall, pain EXAM: LEFT KNEE - 1-2 VIEW COMPARISON:  None. FINDINGS: No fracture or dislocation of the left knee. Mild osteopenia. Joint spaces are well preserved. No knee joint effusion. Soft tissues are unremarkable. IMPRESSION: No fracture or dislocation of the left knee. Electronically Signed   By: Eddie Candle M.D.   On: 06/26/2019 11:06   Dg Hip Unilat With Pelvis 2-3 Views Left  Result Date: 06/26/2019 CLINICAL DATA:  Fall, left hip pain EXAM: DG HIP (WITH OR WITHOUT PELVIS) 2-3V LEFT COMPARISON:  None. FINDINGS: There is a displaced and foreshortened  transcervical fracture of the left femoral neck. The pelvis and right hip are unremarkable in single frontal view. Mild osteopenia. IMPRESSION: There is a displaced and foreshortened transcervical fracture of the left femoral neck. Electronically Signed   By: Eddie Candle M.D.   On: 06/26/2019 11:05    Positive ROS: All other systems have been reviewed and were otherwise negative with the exception of those mentioned in the HPI and as above.  Physical Exam: General: Alert, no acute distress Cardiovascular: No pedal edema Respiratory: No cyanosis, no use of accessory musculature GI: No organomegaly, abdomen is soft and non-tender Skin: No lesions in the area of chief complaint Neurologic: Sensation intact distally Psychiatric: Patient is competent for consent with normal mood and affect Lymphatic: No cervical lymphadenopathy  MUSCULOSKELETAL: Left lower extremity: Patient skin is intact.  Her left lower extremity is shortened and externally rotated.  She has palpable pedal pulses, intact sensation light touch and intact motor function distally.  Her thigh and leg compartments are soft and compressible.  Assessment: Displaced left femoral neck hip fracture  Plan: I explained to the patient the nature of her injury.  She understands she has a displaced left femoral neck hip fracture.  I have recommended a left hip hemiarthroplasty for treatment of this injury.  I described the surgery to the patient as well as the postoperative course.  I explained her that she will likely need stay at a skilled nursing facility upon discharge from the hospital.  I discussed the risks and benefits of surgery. The risks include but are not limited to infection, bleeding requiring blood transfusion, nerve or blood vessel injury, joint stiffness or loss of motion, persistent pain, weakness or instability, fracture, dislocation, leg length discrepancy, change in lower extremity rotation, hardware failure and the need  for further surgery. Medical risks include but are not limited to DVT and pulmonary embolism, myocardial infarction, stroke, pneumonia, respiratory failure and death. Patient understood these risks and wished to proceed.     Thornton Park, MD    06/26/2019 8:05 PM

## 2019-06-26 NOTE — ED Notes (Signed)
Patient transported to X-ray 

## 2019-06-26 NOTE — ED Notes (Signed)
Before RN could leave room pt reporting pain is now starting to increase again and is requesting pain medication. MD made aware.

## 2019-06-27 LAB — CBC
HCT: 43.8 % (ref 36.0–46.0)
Hemoglobin: 14.1 g/dL (ref 12.0–15.0)
MCH: 31.8 pg (ref 26.0–34.0)
MCHC: 32.2 g/dL (ref 30.0–36.0)
MCV: 98.6 fL (ref 80.0–100.0)
Platelets: 239 10*3/uL (ref 150–400)
RBC: 4.44 MIL/uL (ref 3.87–5.11)
RDW: 13.3 % (ref 11.5–15.5)
WBC: 14 10*3/uL — ABNORMAL HIGH (ref 4.0–10.5)
nRBC: 0 % (ref 0.0–0.2)

## 2019-06-27 LAB — COMPREHENSIVE METABOLIC PANEL
ALT: 26 U/L (ref 0–44)
AST: 16 U/L (ref 15–41)
Albumin: 3.4 g/dL — ABNORMAL LOW (ref 3.5–5.0)
Alkaline Phosphatase: 52 U/L (ref 38–126)
Anion gap: 10 (ref 5–15)
BUN: 23 mg/dL (ref 8–23)
CO2: 25 mmol/L (ref 22–32)
Calcium: 8.9 mg/dL (ref 8.9–10.3)
Chloride: 104 mmol/L (ref 98–111)
Creatinine, Ser: 0.61 mg/dL (ref 0.44–1.00)
GFR calc Af Amer: >60 mL/min (ref >60)
GFR calc non Af Amer: >60 mL/min (ref >60)
Glucose, Bld: 107 mg/dL — ABNORMAL HIGH (ref 70–99)
Potassium: 4.5 mmol/L (ref 3.5–5.1)
Sodium: 139 mmol/L (ref 135–145)
Total Bilirubin: 0.9 mg/dL (ref 0.3–1.2)
Total Protein: 6.2 g/dL — ABNORMAL LOW (ref 6.5–8.1)

## 2019-06-27 LAB — PROTIME-INR
INR: 1.1 (ref 0.8–1.2)
Prothrombin Time: 14 seconds (ref 11.4–15.2)

## 2019-06-27 MED ORDER — ENOXAPARIN SODIUM 40 MG/0.4ML ~~LOC~~ SOLN: 40.0000 mg | SUBCUTANEOUS | Status: DC

## 2019-06-27 NOTE — TOC Initial Note (Signed)
Transition of Care [facility]) - Initial/Assessment Note    Patient Details  Name: Lisa [name] MRN: [id number] Date of Birth: 03-23-1941  Transition of Care [facility]) CM/SW Contact:    Matthan Sledge, Lenice Llamas Phone Number: [phone]  06/27/2019, 12:24 PM  Clinical Narrative: Clinical Social Worker ([place]) reviewed chart and noted that patient has a hip fracture. Surgery and PT are pending. CSW met with patient alone at bedside today to discuss D/C plan. Patient was alert and oriented X4 and was laying in the bed. CSW introduced self and explained role of CSW department. Per patient she lives alone in an apartment in [place] and her daughter Angenella checks on her often. Per patient her daughter uses a walker and is disabled herself. Patient reported that her niece also checks on her. CSW explained that after surgery PT will evaluate her and make a recommendation of home health or SNF. Patient reported that she will not go to SNF due to covid and will go home. CSW will continue to follow and assist as needed.    Expected Discharge Plan: [place] Barriers to Discharge: Continued Medical Work up   Patient Goals and CMS Choice Patient states their goals for this hospitalization and ongoing recovery are:: To go home.      Expected Discharge Plan and Services Expected Discharge Plan: [place] In-house Referral: Clinical Social Work Discharge Planning Services: CM Consult   Living arrangements for the past 2 months: Single Family Home Expected Discharge Date: 06/30/19                                    Prior Living Arrangements/Services Living arrangements for the past 2 months: Single Family Home Lives with:: Self Patient language and need for interpreter reviewed:: No Do you feel safe going back to the place where you live?: Yes      Need for Family Participation in Patient Care: Yes (Comment) Care giver support system in place?: No  (comment)   Criminal Activity/Legal Involvement Pertinent to Current Situation/Hospitalization: No - Comment as needed  Activities of Daily Living Home Assistive Devices/Equipment: None ADL Screening (condition at time of admission) Patient's cognitive ability adequate to safely complete daily activities?: Yes Is the patient deaf or have difficulty hearing?: No Does the patient have difficulty seeing, even when wearing glasses/contacts?: No Does the patient have difficulty concentrating, remembering, or making decisions?: No Patient able to express need for assistance with ADLs?: Yes Does the patient have difficulty dressing or bathing?: No Independently performs ADLs?: Yes (appropriate for developmental age) Does the patient have difficulty walking or climbing stairs?: No Weakness of Legs: None Weakness of Arms/Hands: None  Permission Sought/Granted Permission sought to share information with : Other (comment)(Home Healthy agency and DME agency) Permission granted to share information with : Yes, Verbal Permission Granted              Emotional Assessment Appearance:: Appears stated age Attitude/Demeanor/Rapport: Engaged Affect (typically observed): Pleasant, Calm Orientation: : Oriented to Self, Oriented to Place, Oriented to  Time, Oriented to Situation Alcohol / Substance Use: Not Applicable Psych Involvement: No (comment)  Admission diagnosis:  Pre-op exam [Z01.818] Closed fracture of left hip, initial encounter [facility]) [S72.002A] Patient Active Problem List   Diagnosis Date Noted  . Hip fracture ([place]) 06/26/2019  . Blood in stool   . Gastritis without bleeding   . Rectal  polyp   . Polyp of transverse colon   . GI bleed 04/16/2019  . SOB (shortness of breath) 11/01/2018  . Change in bowel habits 06/07/2017  . External hemorrhoid 06/07/2017  . History of laparoscopic cholecystectomy 06/07/2017  . Cholecystitis, chronic 05/26/2017  . History of rectal or anal cancer  05/26/2017  . History of TIA (transient ischemic attack) 05/26/2017  . Pulmonary HTN ([place]) 05/26/2017  . PAC (premature atrial contraction) 09/15/2016  . Asthma without status asthmaticus 09/25/2015  . Cancer of anal margin 08/20/2015  . Chest pain 07/23/2015  . Chronic rhinitis 06/04/2015  . Near syncope 09/12/2014  . Primary osteoarthritis of both knees 08/16/2014  . Muscle strain 05/17/2014  . Cephalalgia 04/10/2014  . LBP (low back pain) 04/10/2014  . Cervical pain 04/10/2014  . Decreased potassium in the blood 03/25/2014  . Arthritis of knee, degenerative 01/25/2014  . 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 Open angle with borderline findings, low risk 07/04/2013  . Foot mass 06/05/2013  . Arthritis of foot, degenerative 06/05/2013  . Carotid artery narrowing 03/30/2013  . Cardiac murmur 03/07/2013  . Persons encountering health services in other specified circumstances 10/26/2012  . Referral of patient 10/26/2012  . Airway hyperreactivity 08/25/2012  . Hypercholesterolemia 08/25/2012  . Degeneration macular 08/25/2012  . Carotid stenosis, bilateral 07/21/2012  . Idiopathic localized osteoarthropathy 02/17/2012  . Asthma, mild persistent 02/14/2012  . URI (upper respiratory infection) 11/15/2011  . ALLERGY, FOOD 05/20/2010  . PULMONARY NODULE, LEFT UPPER LOBE 08/06/2008  . Disease of lung 08/06/2008  . Respiratory insufficiency 08/06/2008  . Sarcoidosis with airway involvement  08/22/2007   PCP:  Clarisse Gouge, MD Pharmacy:   [place], [place] to [address] [place] [address] Phone: [phone] Fax: [phone]  [id number]: [phone]: [phone]    Social Determinants of Health (SDOH) Interventions    Readmission Risk  Interventions No flowsheet data found.

## 2019-06-27 NOTE — NC FL2 (Signed)
[place] LEVEL OF CARE SCREENING TOOL     IDENTIFICATION  Patient Name: Lisa [name] Birthdate: May 25, 1941 Sex: female Admission Date (Current Location): 06/26/2019  [place] and [place] Number:  Engineering geologist and Address:  [facility], [address], [place], [place] [address]      Provider Number: [id number]  Attending Physician Name and Address:  Bettey Costa, MD  Relative Name and Phone Number:       Current Level of Care: Hospital Recommended Level of Care: [place] Prior Approval Number:    Date Approved/Denied:   PASRR Number: [id number]  Discharge Plan: SNF    Current Diagnoses: Patient Active Problem List   Diagnosis Date Noted  . Hip fracture ([place]) 06/26/2019  . Blood in stool   . Gastritis without bleeding   . Rectal polyp   . Polyp of transverse colon   . GI bleed 04/16/2019  . SOB (shortness of breath) 11/01/2018  . Change in bowel habits 06/07/2017  . External hemorrhoid 06/07/2017  . History of laparoscopic cholecystectomy 06/07/2017  . Cholecystitis, chronic 05/26/2017  . History of rectal or anal cancer 05/26/2017  . History of TIA (transient ischemic attack) 05/26/2017  . Pulmonary HTN ([place]) 05/26/2017  . PAC (premature atrial contraction) 09/15/2016  . Asthma without status asthmaticus 09/25/2015  . Cancer of anal margin 08/20/2015  . Chest pain 07/23/2015  . Chronic rhinitis 06/04/2015  . Near syncope 09/12/2014  . Primary osteoarthritis of both knees 08/16/2014  . Muscle strain 05/17/2014  . Cephalalgia 04/10/2014  . LBP (low back pain) 04/10/2014  . Cervical pain 04/10/2014  . Decreased potassium in the blood 03/25/2014  . Arthritis of knee, degenerative 01/25/2014  . 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 Open angle with borderline findings, low risk 07/04/2013  . Foot mass 06/05/2013  . Arthritis of foot, degenerative 06/05/2013  .  Carotid artery narrowing 03/30/2013  . Cardiac murmur 03/07/2013  . Persons encountering health services in other specified circumstances 10/26/2012  . Referral of patient 10/26/2012  . Airway hyperreactivity 08/25/2012  . Hypercholesterolemia 08/25/2012  . Degeneration macular 08/25/2012  . Carotid stenosis, bilateral 07/21/2012  . Idiopathic localized osteoarthropathy 02/17/2012  . Asthma, mild persistent 02/14/2012  . URI (upper respiratory infection) 11/15/2011  . ALLERGY, FOOD 05/20/2010  . PULMONARY NODULE, LEFT UPPER LOBE 08/06/2008  . Disease of lung 08/06/2008  . Respiratory insufficiency 08/06/2008  . Sarcoidosis with airway involvement  08/22/2007    Orientation RESPIRATION BLADDER Height & Weight     Self, Time, Situation, Place  Normal Continent Weight: 170 lb (77.1 kg) Height:  5\' 7"  (170.2 cm)  BEHAVIORAL SYMPTOMS/MOOD NEUROLOGICAL BOWEL NUTRITION STATUS      Continent Diet(Diet: Heart Healthy)  AMBULATORY STATUS COMMUNICATION OF NEEDS Skin   Extensive Assist Verbally Surgical wounds                       Personal Care Assistance Level of Assistance  Bathing, Feeding, Dressing Bathing Assistance: Limited assistance Feeding assistance: Independent Dressing Assistance: Limited assistance     Functional Limitations Info  Sight, Hearing, Speech Sight Info: Adequate Hearing Info: Adequate Speech Info: Adequate    SPECIAL CARE FACTORS FREQUENCY  PT (By licensed PT), OT (By licensed OT)     PT Frequency: 5 OT Frequency: 5            Contractures      Additional Factors Info  Code  Status, Allergies Code Status Info: Full Code. Allergies Info: Shellfish-derived Products, Aspirin, Diazepam, Dairy Aid Lactase           Current Medications (06/27/2019):  This is the current hospital active medication list Current Facility-Administered Medications  Medication Dose Route Frequency Provider Last Rate Last Dose  . acetaminophen (TYLENOL) tablet 650  mg  650 mg Oral Q6H PRN Nicholes Mango, MD   650 mg at 06/26/19 2127   Or  . acetaminophen (TYLENOL) suppository 650 mg  650 mg Rectal Q6H PRN Gouru, Aruna, MD      . albuterol (PROVENTIL) (2.5 MG/3ML) 0.083% nebulizer solution 2.5 mg  2.5 mg Inhalation Q6H PRN Gouru, Aruna, MD      . brimonidine (ALPHAGAN) 0.15 % ophthalmic solution 2 drop  2 drop Left Eye BID Gouru, Aruna, MD      . budesonide (ENTOCORT EC) 24 hr capsule 9 mg  9 mg Oral Daily Gouru, Aruna, MD      . budesonide (PULMICORT) nebulizer solution 0.25 mg  0.25 mg Nebulization BID Gouru, Aruna, MD   0.25 mg at 06/27/19 0844  . cholecalciferol (VITAMIN D3) tablet 1,000 Units  1,000 Units Oral Daily Gouru, Aruna, MD   1,000 Units at 06/27/19 1009  . docusate sodium (COLACE) capsule 100 mg  100 mg Oral BID Gouru, Aruna, MD   100 mg at 06/27/19 1008  . furosemide (LASIX) tablet 40 mg  40 mg Oral Daily Gouru, Aruna, MD   40 mg at 06/27/19 1010  . lidocaine (LIDODERM) 5 % 1 patch  1 patch Transdermal Daily PRN Gouru, Aruna, MD      . magnesium oxide (MAG-OX) tablet 400 mg  400 mg Oral Daily Gouru, Aruna, MD   400 mg at 06/27/19 1009  . morphine 2 MG/ML injection 2 mg  2 mg Intravenous Q4H PRN Gouru, Aruna, MD   2 mg at 06/26/19 1358  . mupirocin ointment (BACTROBAN) 2 % 1 application  1 application Nasal BID Gouru, Aruna, MD      . ondansetron (ZOFRAN) tablet 4 mg  4 mg Oral Q6H PRN Gouru, Aruna, MD       Or  . ondansetron (ZOFRAN) injection 4 mg  4 mg Intravenous Q6H PRN Gouru, Aruna, MD   4 mg at 06/26/19 1418  . oxyCODONE (Oxy IR/ROXICODONE) immediate release tablet 5 mg  5 mg Oral Q4H PRN Gouru, Aruna, MD   5 mg at 06/27/19 0530  . pantoprazole (PROTONIX) EC tablet 40 mg  40 mg Oral Daily Gouru, Aruna, MD   40 mg at 06/27/19 1008  . potassium chloride (K-DUR) CR tablet 10 mEq  10 mEq Oral BID Gouru, Aruna, MD   10 mEq at 06/27/19 1009  . rosuvastatin (CRESTOR) tablet 5 mg  5 mg Oral Archie Balboa, Aruna, MD      . spironolactone  (ALDACTONE) tablet 50 mg  50 mg Oral Daily Gouru, Aruna, MD      . zolpidem (AMBIEN) tablet 5 mg  5 mg Oral QHS Gouru, Illene Silver, MD         Discharge Medications: Please see discharge summary for a list of discharge medications.  Relevant Imaging Results:  Relevant Lab Results:   Additional Information SSN: [id number]  [place], Veronia Beets, LCSW

## 2019-06-27 NOTE — Progress Notes (Signed)
Subjective:  Patient complains of left hip pain.  She states that her asthma is acting up.  Objective:   VITALS:   Vitals:   06/26/19 1115 06/26/19 1351 06/26/19 2344 06/27/19 0757  BP: 116/70 120/79 135/63 (!) 119/58  Pulse: 64 70 93 86  Resp: (!) 21 20 15 14   Temp:  (!) 97.5 F (36.4 C) 99.5 F (37.5 C) 99.1 F (37.3 C)  TempSrc:  Oral Oral Oral  SpO2: 94% 93% 94%   Weight:      Height:        PHYSICAL EXAM: Left lower extremity: Patient's left lower extremity is shortened and externally rotated Neurovascular intact Sensation intact distally Intact pulses distally Dorsiflexion/Plantar flexion intact No cellulitis present Compartment soft  LABS  Results for orders placed or performed during the hospital encounter of 06/26/19 (from the past 24 hour(s))  CBC WITH DIFFERENTIAL     Status: Abnormal   Collection Time: 06/26/19 10:00 AM  Result Value Ref Range   WBC 10.1 4.0 - 10.5 K/uL   RBC 4.31 3.87 - 5.11 MIL/uL   Hemoglobin 13.6 12.0 - 15.0 g/dL   HCT 42.3 36.0 - 46.0 %   MCV 98.1 80.0 - 100.0 fL   MCH 31.6 26.0 - 34.0 pg   MCHC 32.2 30.0 - 36.0 g/dL   RDW 13.0 11.5 - 15.5 %   Platelets 288 150 - 400 K/uL   nRBC 0.0 0.0 - 0.2 %   Neutrophils Relative % 78 %   Neutro Abs 7.9 (H) 1.7 - 7.7 K/uL   Lymphocytes Relative 12 %   Lymphs Abs 1.2 0.7 - 4.0 K/uL   Monocytes Relative 7 %   Monocytes Absolute 0.7 0.1 - 1.0 K/uL   Eosinophils Relative 0 %   Eosinophils Absolute 0.0 0.0 - 0.5 K/uL   Basophils Relative 1 %   Basophils Absolute 0.1 0.0 - 0.1 K/uL   Immature Granulocytes 2 %   Abs Immature Granulocytes 0.20 (H) 0.00 - 0.07 K/uL  Protime-INR     Status: None   Collection Time: 06/26/19 10:00 AM  Result Value Ref Range   Prothrombin Time 14.2 11.4 - 15.2 seconds   INR 1.1 0.8 - 1.2  Comprehensive metabolic panel     Status: Abnormal   Collection Time: 06/26/19 10:00 AM  Result Value Ref Range   Sodium 141 135 - 145 mmol/L   Potassium 4.0 3.5 - 5.1  mmol/L   Chloride 105 98 - 111 mmol/L   CO2 25 22 - 32 mmol/L   Glucose, Bld 105 (H) 70 - 99 mg/dL   BUN 23 8 - 23 mg/dL   Creatinine, Ser 0.60 0.44 - 1.00 mg/dL   Calcium 9.1 8.9 - 10.3 mg/dL   Total Protein 6.6 6.5 - 8.1 g/dL   Albumin 3.6 3.5 - 5.0 g/dL   AST 23 15 - 41 U/L   ALT 27 0 - 44 U/L   Alkaline Phosphatase 48 38 - 126 U/L   Total Bilirubin 0.7 0.3 - 1.2 mg/dL   GFR calc non Af Amer >60 >60 mL/min   GFR calc Af Amer >60 >60 mL/min   Anion gap 11 5 - 15  APTT     Status: Abnormal   Collection Time: 06/26/19 10:00 AM  Result Value Ref Range   aPTT 39 (H) 24 - 36 seconds  SARS Coronavirus 2 [facility] order, Performed in [facility] hospital lab) Nasopharyngeal Nasopharyngeal Swab     Status: None  Collection Time: 06/26/19 11:18 AM   Specimen: Nasopharyngeal Swab  Result Value Ref Range   SARS Coronavirus 2 NEGATIVE NEGATIVE  Type and screen     Status: None   Collection Time: 06/26/19 12:42 PM  Result Value Ref Range   ABO/RH(D) A POS    Antibody Screen NEG    Sample Expiration      06/29/2019,2359 Performed at [facility], [address], [place], [place] [address]   Surgical PCR screen     Status: None   Collection Time: 06/26/19  5:28 PM   Specimen: Nasal Mucosa; Nasal Swab  Result Value Ref Range   MRSA, PCR NEGATIVE NEGATIVE   Staphylococcus aureus NEGATIVE NEGATIVE  Comprehensive metabolic panel     Status: Abnormal   Collection Time: 06/27/19  6:15 AM  Result Value Ref Range   Sodium 139 135 - 145 mmol/L   Potassium 4.5 3.5 - 5.1 mmol/L   Chloride 104 98 - 111 mmol/L   CO2 25 22 - 32 mmol/L   Glucose, Bld 107 (H) 70 - 99 mg/dL   BUN 23 8 - 23 mg/dL   Creatinine, Ser 0.61 0.44 - 1.00 mg/dL   Calcium 8.9 8.9 - 10.3 mg/dL   Total Protein 6.2 (L) 6.5 - 8.1 g/dL   Albumin 3.4 (L) 3.5 - 5.0 g/dL   AST 16 15 - 41 U/L   ALT 26 0 - 44 U/L   Alkaline Phosphatase 52 38 - 126 U/L   Total Bilirubin 0.9 0.3 - 1.2 mg/dL   GFR calc non Af Amer >60  >60 mL/min   GFR calc Af Amer >60 >60 mL/min   Anion gap 10 5 - 15  CBC     Status: Abnormal   Collection Time: 06/27/19  6:15 AM  Result Value Ref Range   WBC 14.0 (H) 4.0 - 10.5 K/uL   RBC 4.44 3.87 - 5.11 MIL/uL   Hemoglobin 14.1 12.0 - 15.0 g/dL   HCT 43.8 36.0 - 46.0 %   MCV 98.6 80.0 - 100.0 fL   MCH 31.8 26.0 - 34.0 pg   MCHC 32.2 30.0 - 36.0 g/dL   RDW 13.3 11.5 - 15.5 %   Platelets 239 150 - 400 K/uL   nRBC 0.0 0.0 - 0.2 %  Protime-INR     Status: None   Collection Time: 06/27/19  6:15 AM  Result Value Ref Range   Prothrombin Time 14.0 11.4 - 15.2 seconds   INR 1.1 0.8 - 1.2    Dg Chest 1 View  Result Date: 06/26/2019 CLINICAL DATA:  Fall, left leg pain EXAM: CHEST  1 VIEW COMPARISON:  09/13/2018 FINDINGS: Cardiomegaly. There are bilateral calcified mediastinal and hilar lymph nodes as well as calcified pulmonary nodules. There is architectural distortion of the upper lobes of the lungs. The visualized skeletal structures are unremarkable. IMPRESSION: 1.  No acute abnormality of the lungs. 2. There are bilateral calcified mediastinal and hilar lymph nodes as well as calcified pulmonary nodules. There is architectural distortion of the upper lobes of the lungs. 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 3.  Cardiomegaly. Electronically Signed   By: Eddie Candle M.D.   On: 06/26/2019 11:03   Dg Knee 2 Views Left  Result Date: 06/26/2019 CLINICAL DATA:  Fall, pain EXAM: LEFT KNEE - 1-2 VIEW COMPARISON:  None. FINDINGS: No fracture or dislocation of the left knee. Mild osteopenia. Joint spaces are well preserved. No knee  joint effusion. Soft tissues are unremarkable. IMPRESSION: No fracture or dislocation of the left knee. Electronically Signed   By: Eddie Candle M.D.   On: 06/26/2019 11:06   Dg Hip Unilat With Pelvis 2-3 Views Left  Result Date: 06/26/2019 CLINICAL DATA:  Fall, left hip pain EXAM: DG HIP (WITH OR  WITHOUT PELVIS) 2-3V LEFT COMPARISON:  None. FINDINGS: There is a displaced and foreshortened transcervical fracture of the left femoral neck. The pelvis and right hip are unremarkable in single frontal view. Mild osteopenia. IMPRESSION: There is a displaced and foreshortened transcervical fracture of the left femoral neck. Electronically Signed   By: Eddie Candle M.D.   On: 06/26/2019 11:05    Assessment/Plan:     Active Problems:   Hip fracture [facility])  Patient is on Eliquis.  She will be n.p.o. after midnight.  Patient is scheduled for surgery tomorrow morning, over 48 hours after her last Eliquis dose.      Thornton [place] , MD 06/27/2019, 8:46 AM

## 2019-06-27 NOTE — Progress Notes (Signed)
[facility] NAME: Lisa [name]    MR#:  [id number]  DATE OF BIRTH:  1941/05/28  SUBJECTIVE:  Patient doing well this morning.  Had a breathing treatment this morning.  Denies shortness of breath or wheezing.  REVIEW OF SYSTEMS:    Review of Systems  Constitutional: Negative for fever, chills weight loss HENT: Negative for ear pain, nosebleeds, congestion, facial swelling, rhinorrhea, neck pain, neck stiffness and ear discharge.   Respiratory: Negative for cough, shortness of breath, wheezing  Cardiovascular: Negative for chest pain, palpitations and leg swelling.  Gastrointestinal: Negative for heartburn, abdominal pain, vomiting, diarrhea or consitpation Genitourinary: Negative for dysuria, urgency, frequency, hematuria Musculoskeletal: Negative for back pain or ++ joint pain Neurological: Negative for dizziness, seizures, syncope, focal weakness,  numbness and headaches.  Hematological: Does not bruise/bleed easily.  Psychiatric/Behavioral: Negative for hallucinations, confusion, dysphoric mood    Tolerating Diet: yes      DRUG ALLERGIES:   Allergies  Allergen Reactions  . Shellfish-Derived Products Swelling  . Aspirin Other (See Comments)    Pt states, "I'm a free bleeder."  . Diazepam Other (See Comments)    Affected her hearing and sight  . Dairy Aid [Lactase] Nausea And Vomiting    VITALS:  Blood pressure (!) 119/58, pulse 86, temperature 99.1 F (37.3 C), temperature source Oral, resp. rate 14, height 5\' 7"  (1.702 m), weight 77.1 kg, SpO2 96 %.  PHYSICAL EXAMINATION:  Constitutional: Appears well-developed and well-nourished. No distress. HENT: Normocephalic. Marland Kitchen Oropharynx is clear and moist.  Eyes: Conjunctivae and EOM are normal. PERRLA, no scleral icterus.  Neck: Normal ROM. Neck supple. No JVD. No tracheal deviation. CVS: RRR, S1/S2 +, no murmurs, no gallops, no carotid bruit.  Pulmonary: Effort and  breath sounds normal, no stridor, rhonchi, wheezes, rales.  Abdominal: Soft. BS +,  no distension, tenderness, rebound or guarding.  Musculoskeletal: Normal range of motion. No edema and no tenderness.  Neuro: Alert. CN 2-12 grossly intact. No focal deficits. Skin: Skin is warm and dry. No rash noted. Psychiatric: Normal mood and affect.      LABORATORY PANEL:   CBC Recent Labs  Lab 06/27/19 0615  WBC 14.0*  HGB 14.1  HCT 43.8  PLT 239   ------------------------------------------------------------------------------------------------------------------  Chemistries  Recent Labs  Lab 06/27/19 0615  NA 139  K 4.5  CL 104  CO2 25  GLUCOSE 107*  BUN 23  CREATININE 0.61  CALCIUM 8.9  AST 16  ALT 26  ALKPHOS 52  BILITOT 0.9   ------------------------------------------------------------------------------------------------------------------  Cardiac Enzymes No results for input(s): TROPONINI in the last 168 hours. ------------------------------------------------------------------------------------------------------------------  RADIOLOGY:  Dg Chest 1 View  Result Date: 06/26/2019 CLINICAL DATA:  Fall, left leg pain EXAM: CHEST  1 VIEW COMPARISON:  09/13/2018 FINDINGS: Cardiomegaly. There are bilateral calcified mediastinal and hilar lymph nodes as well as calcified pulmonary nodules. There is architectural distortion of the upper lobes of the lungs. The visualized skeletal structures are unremarkable. IMPRESSION: 1.  No acute abnormality of the lungs. 2. There are bilateral calcified mediastinal and hilar lymph nodes as well as calcified pulmonary nodules. There is architectural distortion of the upper lobes of the lungs. 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 3.  Cardiomegaly. Electronically Signed   By: Eddie Candle M.D.   On: 06/26/2019 11:03   Dg Knee 2 Views Left  Result Date: 06/26/2019 CLINICAL  DATA:  Fall, pain EXAM: LEFT  KNEE - 1-2 VIEW COMPARISON:  None. FINDINGS: No fracture or dislocation of the left knee. Mild osteopenia. Joint spaces are well preserved. No knee joint effusion. Soft tissues are unremarkable. IMPRESSION: No fracture or dislocation of the left knee. Electronically Signed   By: Eddie Candle M.D.   On: 06/26/2019 11:06   Dg Hip Unilat With Pelvis 2-3 Views Left  Result Date: 06/26/2019 CLINICAL DATA:  Fall, left hip pain EXAM: DG HIP (WITH OR WITHOUT PELVIS) 2-3V LEFT COMPARISON:  None. FINDINGS: There is a displaced and foreshortened transcervical fracture of the left femoral neck. The pelvis and right hip are unremarkable in single frontal view. Mild osteopenia. IMPRESSION: There is a displaced and foreshortened transcervical fracture of the left femoral neck. Electronically Signed   By: Eddie Candle M.D.   On: 06/26/2019 11:05     ASSESSMENT AND PLAN:   78 year old female with primary pulmonary hypertension, PAF and sarcoidosis who presented to the emergency room after mechanical fall.   1.  Displaced transcervical fracture of left femoral neck: Plan to go to OR tomorrow, 48 hours after the use of Eliquis Patient moderate risk for moderate risk procedure.  No further cardiac intervention.  2.  PAF: Currently in normal sinus rhythm off of Eliquis temporarily for surgery. Heart rate controlled  3.  Primary pulmonary hypertension on Aldactone Followed at [facility] 4.  Sarcoidosis without flare  5.  Hyperlipidemia: Continue Crestor   Management plans discussed with the patient and she is in agreement.  CODE STATUS: full  TOTAL TIME TAKING CARE OF THIS PATIENT: 30 minutes.     POSSIBLE D/C 3 days, DEPENDING ON CLINICAL CONDITION.   Bettey Costa M.D on 06/27/2019 at 10:45 AM  Between 7am to 6pm - Pager - [phone] After 6pm go to [email or web address] - password [email or web address] [facility]  Office  [phone]  CC: Primary care physician; Clarisse Gouge, MD  Note: This dictation was prepared with Dragon dictation along with smaller phrase technology. Any transcriptional errors that result from this process are unintentional.

## 2019-06-28 ENCOUNTER — Inpatient Hospital Stay: Payer: Medicare Other | Admitting: Anesthesiology

## 2019-06-28 ENCOUNTER — Encounter: Payer: Self-pay | Admitting: *Deleted

## 2019-06-28 ENCOUNTER — Other Ambulatory Visit: Payer: Self-pay | Admitting: Specialist

## 2019-06-28 ENCOUNTER — Inpatient Hospital Stay: Payer: Medicare Other

## 2019-06-28 ENCOUNTER — Encounter
Admission: EM | Disposition: A | Discharge status: Skilled Nursing Facility | Payer: Self-pay | Source: Home / Self Care | Attending: Internal Medicine

## 2019-06-28 HISTORY — PX: HIP ARTHROPLASTY: SHX981

## 2019-06-28 LAB — CBC
HCT: 40.2 % (ref 36.0–46.0)
Hemoglobin: 13.3 g/dL (ref 12.0–15.0)
MCH: 31.8 pg (ref 26.0–34.0)
MCHC: 33.1 g/dL (ref 30.0–36.0)
MCV: 96.2 fL (ref 80.0–100.0)
Platelets: 236 10*3/uL (ref 150–400)
RBC: 4.18 MIL/uL (ref 3.87–5.11)
RDW: 13.2 % (ref 11.5–15.5)
WBC: 14.3 10*3/uL — ABNORMAL HIGH (ref 4.0–10.5)
nRBC: 0 % (ref 0.0–0.2)

## 2019-06-28 LAB — CREATININE, SERUM
Creatinine, Ser: 0.54 mg/dL (ref 0.44–1.00)
GFR calc Af Amer: >60 mL/min (ref >60)
GFR calc non Af Amer: >60 mL/min (ref >60)

## 2019-06-28 SURGERY — HEMIARTHROPLASTY, HIP, DIRECT ANTERIOR APPROACH, FOR FRACTURE
Anesthesia: Spinal | Site: Hip | Laterality: Left

## 2019-06-28 MED ORDER — HYDROCODONE-ACETAMINOPHEN 5-325 MG PO TABS
1.0000 | ORAL_TABLET | Freq: Four times a day (QID) | ORAL | Status: DC | PRN
  Administered 2019-06-28 – 2019-06-29 (×2): 1 via ORAL
  Filled 2019-06-28 (×2): qty 1

## 2019-06-28 MED ORDER — MENTHOL 3 MG MT LOZG
1.0000 | LOZENGE | OROMUCOSAL | Status: DC | PRN
  Filled 2019-06-28: qty 9

## 2019-06-28 MED ORDER — MORPHINE SULFATE (PF) 2 MG/ML IV SOLN: 1.0000 mg | INTRAVENOUS | Status: DC | PRN

## 2019-06-28 MED ORDER — KETAMINE HCL 50 MG/ML IJ SOLN
INTRAMUSCULAR | Status: DC | PRN
  Administered 2019-06-28: 25 mg via INTRAMUSCULAR

## 2019-06-28 MED ORDER — NEOMYCIN-POLYMYXIN B GU 40-200000 IR SOLN
Status: AC
  Filled 2019-06-28: qty 20

## 2019-06-28 MED ORDER — PROMETHAZINE HCL 25 MG/ML IJ SOLN: 6.2500 mg | INTRAMUSCULAR | Status: DC | PRN

## 2019-06-28 MED ORDER — MIDAZOLAM HCL 5 MG/5ML IJ SOLN
INTRAMUSCULAR | Status: DC | PRN
  Administered 2019-06-28: 2 mg via INTRAVENOUS

## 2019-06-28 MED ORDER — CEFAZOLIN SODIUM-DEXTROSE 2-4 GM/100ML-% IV SOLN
INTRAVENOUS | Status: AC
  Filled 2019-06-28: qty 100

## 2019-06-28 MED ORDER — PHENOL 1.4 % MT LIQD
1.0000 | OROMUCOSAL | Status: DC | PRN
  Filled 2019-06-28: qty 177

## 2019-06-28 MED ORDER — LACTATED RINGERS IV SOLN
INTRAVENOUS | Status: DC
  Administered 2019-06-28 (×2): via INTRAVENOUS

## 2019-06-28 MED ORDER — CLINDAMYCIN PHOSPHATE 600 MG/50ML IV SOLN
600.0000 mg | INTRAVENOUS | Status: AC
  Administered 2019-06-28: 16:00:00 600 mg via INTRAVENOUS

## 2019-06-28 MED ORDER — METOCLOPRAMIDE HCL 10 MG PO TABS: 5.0000 mg | ORAL_TABLET | Freq: Three times a day (TID) | ORAL | Status: DC | PRN

## 2019-06-28 MED ORDER — ACETAMINOPHEN 325 MG PO TABS: 325.0000 mg | ORAL_TABLET | Freq: Four times a day (QID) | ORAL | Status: DC | PRN

## 2019-06-28 MED ORDER — FERROUS SULFATE 325 (65 FE) MG PO TABS
325.0000 mg | ORAL_TABLET | Freq: Every day | ORAL | Status: DC
  Administered 2019-06-29 – 2019-06-30 (×2): 325 mg via ORAL
  Filled 2019-06-28 (×2): qty 1

## 2019-06-28 MED ORDER — CLINDAMYCIN PHOSPHATE 600 MG/50ML IV SOLN
INTRAVENOUS | Status: AC
  Filled 2019-06-28: qty 50

## 2019-06-28 MED ORDER — ACETAMINOPHEN 325 MG PO TABS: 325.0000 mg | ORAL_TABLET | ORAL | Status: DC | PRN

## 2019-06-28 MED ORDER — HYDROCODONE-ACETAMINOPHEN 7.5-325 MG PO TABS
1.0000 | ORAL_TABLET | Freq: Four times a day (QID) | ORAL | Status: DC | PRN
  Administered 2019-06-30: 1 via ORAL
  Filled 2019-06-28: qty 1

## 2019-06-28 MED ORDER — MIDAZOLAM HCL 2 MG/2ML IJ SOLN
INTRAMUSCULAR | Status: AC
  Filled 2019-06-28: qty 2

## 2019-06-28 MED ORDER — ALUM & MAG HYDROXIDE-SIMETH 200-200-20 MG/5ML PO SUSP: 30.0000 mL | ORAL | Status: DC | PRN

## 2019-06-28 MED ORDER — ACETAMINOPHEN 10 MG/ML IV SOLN
INTRAVENOUS | Status: DC | PRN
  Administered 2019-06-28: 1000 mg via INTRAVENOUS

## 2019-06-28 MED ORDER — CHLORHEXIDINE GLUCONATE CLOTH 2 % EX PADS: 6.0000 | MEDICATED_PAD | Freq: Once | CUTANEOUS | Status: DC

## 2019-06-28 MED ORDER — CEFAZOLIN SODIUM-DEXTROSE 2-4 GM/100ML-% IV SOLN: 2.0000 g | INTRAVENOUS | Status: DC

## 2019-06-28 MED ORDER — BUPIVACAINE LIPOSOME 1.3 % IJ SUSP
INTRAMUSCULAR | Status: AC
  Filled 2019-06-28: qty 20

## 2019-06-28 MED ORDER — ACETAMINOPHEN 10 MG/ML IV SOLN
INTRAVENOUS | Status: AC
  Filled 2019-06-28: qty 100

## 2019-06-28 MED ORDER — PROPOFOL 500 MG/50ML IV EMUL
INTRAVENOUS | Status: AC
  Filled 2019-06-28: qty 50

## 2019-06-28 MED ORDER — METOCLOPRAMIDE HCL 5 MG/ML IJ SOLN: 5.0000 mg | Freq: Three times a day (TID) | INTRAMUSCULAR | Status: DC | PRN

## 2019-06-28 MED ORDER — CEFAZOLIN SODIUM-DEXTROSE 2-3 GM-%(50ML) IV SOLR
INTRAVENOUS | Status: DC | PRN
  Administered 2019-06-28: 2 g via INTRAVENOUS

## 2019-06-28 MED ORDER — SODIUM CHLORIDE 0.9 % IV SOLN
INTRAVENOUS | Status: DC | PRN
  Administered 2019-06-28: 50 ug/min via INTRAVENOUS

## 2019-06-28 MED ORDER — TRANEXAMIC ACID-NACL 1000-0.7 MG/100ML-% IV SOLN
INTRAVENOUS | Status: DC | PRN
  Administered 2019-06-28: 1000 mg via INTRAVENOUS

## 2019-06-28 MED ORDER — ONDANSETRON HCL 4 MG/2ML IJ SOLN: 4.0000 mg | Freq: Four times a day (QID) | INTRAMUSCULAR | Status: DC | PRN

## 2019-06-28 MED ORDER — BUPIVACAINE-EPINEPHRINE (PF) 0.5% -1:200000 IJ SOLN
INTRAMUSCULAR | Status: AC
  Filled 2019-06-28: qty 30

## 2019-06-28 MED ORDER — METHOCARBAMOL 1000 MG/10ML IJ SOLN
500.0000 mg | Freq: Four times a day (QID) | INTRAVENOUS | Status: DC | PRN
  Filled 2019-06-28: qty 5

## 2019-06-28 MED ORDER — ONDANSETRON HCL 4 MG PO TABS: 4.0000 mg | ORAL_TABLET | Freq: Four times a day (QID) | ORAL | Status: DC | PRN

## 2019-06-28 MED ORDER — BUPIVACAINE-EPINEPHRINE (PF) 0.25% -1:200000 IJ SOLN
INTRAMUSCULAR | Status: AC
  Filled 2019-06-28: qty 30

## 2019-06-28 MED ORDER — METHOCARBAMOL 500 MG PO TABS: 500.0000 mg | ORAL_TABLET | Freq: Four times a day (QID) | ORAL | Status: DC | PRN

## 2019-06-28 MED ORDER — FLEET ENEMA 7-19 GM/118ML RE ENEM: 1.0000 | ENEMA | Freq: Once | RECTAL | Status: DC | PRN

## 2019-06-28 MED ORDER — TRANEXAMIC ACID 1000 MG/10ML IV SOLN
INTRAVENOUS | Status: AC
  Filled 2019-06-28: qty 10

## 2019-06-28 MED ORDER — BUPIVACAINE-EPINEPHRINE (PF) 0.25% -1:200000 IJ SOLN
INTRAMUSCULAR | Status: DC | PRN
  Administered 2019-06-28: 30 mL via PERINEURAL

## 2019-06-28 MED ORDER — DOCUSATE SODIUM 100 MG PO CAPS: 100.0000 mg | ORAL_CAPSULE | Freq: Two times a day (BID) | ORAL | Status: DC

## 2019-06-28 MED ORDER — ACETAMINOPHEN 160 MG/5ML PO SOLN
325.0000 mg | ORAL | Status: DC | PRN
  Filled 2019-06-28: qty 20.3

## 2019-06-28 MED ORDER — CLINDAMYCIN PHOSPHATE 600 MG/50ML IV SOLN: 600.0000 mg | Freq: Three times a day (TID) | INTRAVENOUS | Status: AC

## 2019-06-28 MED ORDER — GABAPENTIN 300 MG PO CAPS
300.0000 mg | ORAL_CAPSULE | Freq: Three times a day (TID) | ORAL | Status: DC
  Administered 2019-06-28 – 2019-06-30 (×5): 300 mg via ORAL
  Filled 2019-06-28 (×5): qty 1

## 2019-06-28 MED ORDER — SODIUM CHLORIDE 0.45 % IV SOLN
INTRAVENOUS | Status: DC
  Administered 2019-06-28 – 2019-06-29 (×3): via INTRAVENOUS

## 2019-06-28 MED ORDER — PROPOFOL 500 MG/50ML IV EMUL
INTRAVENOUS | Status: DC | PRN
  Administered 2019-06-28: 50 ug/kg/min via INTRAVENOUS

## 2019-06-28 MED ORDER — CEFAZOLIN SODIUM-DEXTROSE 2-4 GM/100ML-% IV SOLN
2.0000 g | Freq: Three times a day (TID) | INTRAVENOUS | Status: AC
  Administered 2019-06-28 – 2019-06-29 (×3): 2 g via INTRAVENOUS
  Filled 2019-06-28 (×3): qty 100

## 2019-06-28 MED ORDER — BISACODYL 10 MG RE SUPP
10.0000 mg | Freq: Every day | RECTAL | Status: DC | PRN
  Administered 2019-06-30: 10 mg via RECTAL
  Filled 2019-06-28: qty 1

## 2019-06-28 MED ORDER — FENTANYL CITRATE (PF) 100 MCG/2ML IJ SOLN: 25.0000 ug | INTRAMUSCULAR | Status: DC | PRN

## 2019-06-28 MED ORDER — MEPERIDINE HCL 50 MG/ML IJ SOLN: 6.2500 mg | INTRAMUSCULAR | Status: DC | PRN

## 2019-06-28 MED ORDER — ENOXAPARIN SODIUM 30 MG/0.3ML ~~LOC~~ SOLN: 30.0000 mg | SUBCUTANEOUS | Status: DC

## 2019-06-28 MED ORDER — ZOLPIDEM TARTRATE 5 MG PO TABS: 5.0000 mg | ORAL_TABLET | Freq: Every evening | ORAL | Status: DC | PRN

## 2019-06-28 MED ORDER — KETAMINE HCL 50 MG/ML IJ SOLN
INTRAMUSCULAR | Status: AC
  Filled 2019-06-28: qty 10

## 2019-06-28 MED ORDER — BUPIVACAINE HCL (PF) 0.5 % IJ SOLN
INTRAMUSCULAR | Status: DC | PRN
  Administered 2019-06-28: 3 mL

## 2019-06-28 SURGICAL SUPPLY — 60 items
APL PRP STRL LF DISP 70% ISPRP (MISCELLANEOUS) ×2
BLADE DEBAKEY 8.0 (BLADE) ×2 IMPLANT
BLADE DEBAKEY 8.0MM (BLADE) ×1
BLADE SAGITTAL WIDE XTHICK NO (BLADE) ×3 IMPLANT
BLADE SURG SZ10 CARB STEEL (BLADE) ×3 IMPLANT
CANISTER SUCT 1200ML W/VALVE (MISCELLANEOUS) ×9 IMPLANT
CHLORAPREP W/TINT 26 (MISCELLANEOUS) ×6 IMPLANT
COVER BACK TABLE REUSABLE LG (DRAPES) ×3 IMPLANT
COVER WAND RF STERILE (DRAPES) ×3 IMPLANT
DRAPE INCISE IOBAN 66X60 STRL (DRAPES) ×6 IMPLANT
DRSG AQUACEL AG ADV 3.5X10 (GAUZE/BANDAGES/DRESSINGS) ×3 IMPLANT
DRSG AQUACEL AG ADV 3.5X14 (GAUZE/BANDAGES/DRESSINGS) ×3 IMPLANT
ELECT BLADE 6.5 EXT (BLADE) ×3 IMPLANT
ELECT CAUTERY BLADE 6.4 (BLADE) ×3 IMPLANT
ELECT REM PT RETURN 9FT ADLT (ELECTROSURGICAL) ×3
ELECTRODE REM PT RTRN 9FT ADLT (ELECTROSURGICAL) ×1 IMPLANT
GAUZE SPONGE 4X4 12PLY STRL (GAUZE/BANDAGES/DRESSINGS) ×3 IMPLANT
GAUZE XEROFORM 1X8 LF (GAUZE/BANDAGES/DRESSINGS) ×4 IMPLANT
GLOVE INDICATOR 8.0 STRL GRN (GLOVE) ×5 IMPLANT
GLOVE SURG ORTHO 8.5 STRL (GLOVE) ×3 IMPLANT
GOWN STRL REUS W/ TWL LRG LVL3 (GOWN DISPOSABLE) ×2 IMPLANT
GOWN STRL REUS W/TWL LRG LVL3 (GOWN DISPOSABLE) ×6
GOWN STRL REUS W/TWL LRG LVL4 (GOWN DISPOSABLE) ×3 IMPLANT
HANDLE YANKAUER SUCT BULB TIP (MISCELLANEOUS) ×2 IMPLANT
HEAD MODULAR ENDO (Orthopedic Implant) ×3 IMPLANT
HEAD UNPLR 48XMDLR STRL HIP (Orthopedic Implant) IMPLANT
HEMOVAC 400CC 10FR (MISCELLANEOUS) ×1 IMPLANT
IV NS 1000ML (IV SOLUTION) ×3
IV NS 1000ML BAXH (IV SOLUTION) ×1 IMPLANT
KIT TURNOVER KIT A (KITS) ×3 IMPLANT
NDL FILTER BLUNT 18X1 1/2 (NEEDLE) ×1 IMPLANT
NDL MAYO CATGUT SZ4 TPR NDL (NEEDLE) ×1 IMPLANT
NDL SPNL 18GX3.5 QUINCKE PK (NEEDLE) ×2 IMPLANT
NEEDLE FILTER BLUNT 18X 1/2SAF (NEEDLE) ×2
NEEDLE FILTER BLUNT 18X1 1/2 (NEEDLE) ×1 IMPLANT
NEEDLE MAYO CATGUT SZ4 (NEEDLE) ×3 IMPLANT
NEEDLE SPNL 18GX3.5 QUINCKE PK (NEEDLE) ×6 IMPLANT
NS IRRIG 1000ML POUR BTL (IV SOLUTION) ×3 IMPLANT
PACK HIP PROSTHESIS (MISCELLANEOUS) ×3 IMPLANT
PAD ABD DERMACEA PRESS 5X9 (GAUZE/BANDAGES/DRESSINGS) ×6 IMPLANT
PILLOW ABDUCTION FOAM SM (MISCELLANEOUS) ×2 IMPLANT
PULSAVAC PLUS IRRIG FAN TIP (DISPOSABLE) ×3
SLEEVE UNITRAX V40 (Orthopedic Implant) ×3 IMPLANT
SLEEVE UNITRAX V40 +4 (Orthopedic Implant) IMPLANT
SOL PREP PVP 2OZ (MISCELLANEOUS) ×3
SOLUTION PREP PVP 2OZ (MISCELLANEOUS) ×1 IMPLANT
STAPLER SKIN PROX 35W (STAPLE) ×3 IMPLANT
STEM HIP 127 DEG (Stem) ×2 IMPLANT
STEM HIP ACCOLADE SZ5 37X145 (Stem) IMPLANT
SUT DVC 2 QUILL PDO  T11 36X36 (SUTURE) ×4
SUT DVC 2 QUILL PDO T11 36X36 (SUTURE) ×2 IMPLANT
SUT QUILL PDO 0 36 36 VIOLET (SUTURE) ×5 IMPLANT
SUT TICRON 2-0 30IN 311381 (SUTURE) ×13 IMPLANT
SYR 10ML LL (SYRINGE) ×3 IMPLANT
SYR 30ML LL (SYRINGE) ×5 IMPLANT
SYR 50ML LL SCALE MARK (SYRINGE) ×3 IMPLANT
TAPE MICROFOAM 4IN (TAPE) ×3 IMPLANT
TIP FAN IRRIG PULSAVAC PLUS (DISPOSABLE) ×1 IMPLANT
TUBE SUCT KAM VAC (TUBING) ×1 IMPLANT
WATER STERILE IRR 1000ML POUR (IV SOLUTION) ×3 IMPLANT

## 2019-06-28 NOTE — Op Note (Signed)
06/28/2019  6:29 PM  PATIENT:  Lisa [name]   MRN: [id number]  PRE-OPERATIVE DIAGNOSIS:  Displaced Subcapital fracture left hip   POST-OPERATIVE DIAGNOSIS: Same  PROCEDURE: Left   hip hemiarthroplasty with Stryker Accolade prosthesis  PREOPERATIVE INDICATIONS:  Lisa [name] is an 77 y.o. female who was admitted 06/26/2019 with a diagnosis of displaced subcapital fracture of the hip and elected for surgical management.  The risks benefits and alternatives were discussed with the patient including but not limited to the risks of nonoperative treatment, versus surgical intervention including infection, bleeding, nerve injury, periprosthetic fracture, the need for revision surgery, dislocation, leg length discrepancy, blood clots, cardiopulmonary complications, morbidity, mortality, among others, and they were willing to proceed.  Predicted outcome is good, although there will be at least a six to nine month expected recovery.     SURGEON:  Earnestine Leys, MD  ASST: Tessa Lerner, PA-C    ANESTHESIA: Spinal    COMPLICATIONS:  None.   EBL: 50 cc    COMPONENTS:  Stryker Accolade Femoral Fracture stem size #5,   and a size   48 mm  fracture head unipolar hip ball with    +4 mm  neck length.    PROCEDURE IN DETAIL: The patient was met in the holding area and identified.  The appropriate hip  was marked at the operative site. The patient was then transported to the OR and  placed under general anesthesia.  At that point, the patient was  placed in the lateral decubitus position with the operative side up and  secured to the operating room table and all bony prominences padded.     The operative lower extremity was prepped from the iliac crest to the toes.  Sterile draping was performed.  Time out was performed prior to incision.      A routine posterolateral approach was utilized via sharp dissection  carried down to the subcutaneous tissue.  Gross bleeders were Bovie  coagulated.  The  iliotibial band was identified and incised  along the length of the skin incision.  Self-retaining retractors were  inserted.  With the hip internally rotated, the short external rotators  were identified. The piriformis was tagged and the hip capsule released in a T-type fashion.  The femoral neck was exposed, and I resected the femoral neck using the appropriate jig. This was performed at approximately a thumb's breadth above the lesser trochanter.    I then exposed the deep acetabulum, cleared out any tissue including the ligamentum teres.    I then prepared the proximal femur using the cookie-cutter, the lateralizing reamer, and then sequentially broached.  A trial stem   was  utilized along with a unipolar head and neck.  I reduced the hip and it was found to have excellent stability with functional range of motion. Leg lengths were equal.  The trial components were then removed.   The same size Accolade femoral stem was then inserted and was very stable.  The Unitrax head and neck as trialed were inserted as well.     The hip was then reduced and taken through functional range of motion and found to have excellent stability. Leg lengths were restored.     I closed the T in the capsule with #2 Ticron as well as the short external rotators. A hemovac was inserted.    I then irrigated the hip copiously again with pulse lavage, and repaired the fascia with #2 Quill and the subcutaneous layer with #  0 Quill. Sponge and needle counts were correct. Dry sterile Aquacell was applied.   The patient was then awakened and returned to PACU in stable and satisfactory condition. There were no complications.  Park Breed, MD Orthopedic Surgeon [phone]   06/28/2019 6:29 PM

## 2019-06-28 NOTE — Anesthesia Preprocedure Evaluation (Addendum)
Anesthesia Evaluation  Patient identified by MRN, date of birth, ID band Patient awake    Reviewed: Allergy & Precautions, H&P , NPO status , reviewed documented beta blocker date and time   Airway Mallampati: II  TM Distance: <3 FB Neck ROM: limited    Dental  (+) Caps   Pulmonary shortness of breath, asthma ,    Pulmonary exam normal        Cardiovascular Normal cardiovascular exam+ Valvular Problems/Murmurs   2013 ECHO - Nml LV/RV, mild MR, no sig valvular issues   Neuro/Psych  Headaches, TIA Neuromuscular disease    GI/Hepatic   Endo/Other    Renal/GU      Musculoskeletal  (+) Arthritis ,   Abdominal   Peds  Hematology   Anesthesia Other Findings Past Medical History: No date: Arthritis No date: Asthma No date: Cancer [facility])     Comment:  anal No date: Carotid stenosis 05/22/2013: Colon polyp No date: Foot mass No date: Glaucoma (increased eye pressure) No date: Headache No date: Heart murmur No date: Hypercholesterolemia No date: Hyperlipidemia No date: Irregular heartbeat No date: Localized primary osteoarthritis of lower leg No date: Macular degeneration No date: Mitral valve prolapse No date: Neck pain No date: Osteopenia No date: Sarcoidosis No date: Sarcoidosis No date: Shortness of breath dyspnea     Comment:  with exertion No date: TIA (transient ischemic attack)  Past Surgical History: No date: ABDOMINAL HYSTERECTOMY 08/18/09: cataract surgery     Comment:  left eye 05/22/2013: COLONOSCOPY 09/15/2015: COLONOSCOPY WITH PROPOFOL; N/A     Comment:  Procedure: COLONOSCOPY WITH PROPOFOL;  Surgeon: Hulen Luster, MD;  Location: [facility] ENDOSCOPY;  Service:               Gastroenterology;  Laterality: N/A; 04/17/2019: COLONOSCOPY WITH PROPOFOL; N/A     Comment:  Procedure: COLONOSCOPY WITH PROPOFOL;  Surgeon: Lucilla Lame, MD;  Location: [facility] ENDOSCOPY;  Service:                Endoscopy;  Laterality: N/A; No date: DILATION AND CURETTAGE OF UTERUS 04/17/2019: ESOPHAGOGASTRODUODENOSCOPY (EGD) WITH PROPOFOL; N/A     Comment:  Procedure: ESOPHAGOGASTRODUODENOSCOPY (EGD) WITH               PROPOFOL;  Surgeon: Lucilla Lame, MD;  Location: [facility]               ENDOSCOPY;  Service: Endoscopy;  Laterality: N/A; No date: EYE SURGERY; Bilateral     Comment:  cataract extraction 07/24/2015: HEMORRHOID SURGERY; N/A     Comment:  Procedure: HEMORRHOIDECTOMY/INTERNAL AND EXTERNAL;                Surgeon: Leonie Green, MD;  Location: [facility] ORS;                Service: General;  Laterality: N/A; No date: hysterectomy with ovaries intact 08/21/2015: MASS EXCISION; N/A     Comment:  Procedure: EXCISION MASS/ ANAL MASS;  Surgeon: Leonie Green, MD;  Location: [facility] ORS;  Service: General;              Laterality: N/A; No date: TONSILLECTOMY 09/16/2015: TUMOR EXCISION; N/A     Comment:  Procedure: TUMOR EXCISION RECTAL;  Surgeon: Delfina Redwood  Rochel Brome, MD;  Location: [facility] ORS;  Service: General;              Laterality: N/A;  BMI    Body Mass Index: 26.63 kg/m      Reproductive/Obstetrics                            Anesthesia Physical Anesthesia Plan  ASA: III  Anesthesia Plan: Spinal   Post-op Pain Management:    Induction: Intravenous  PONV Risk Score and Plan: 2 and Treatment may vary due to age or medical condition, TIVA and Ondansetron  Airway Management Planned: Natural Airway and Nasal Cannula  Additional Equipment:   Intra-op Plan:   Post-operative Plan:   Informed Consent: I have reviewed the patients History and Physical, chart, labs and discussed the procedure including the risks, benefits and alternatives for the proposed anesthesia with the patient or authorized representative who has indicated his/her understanding and acceptance.     Dental Advisory Given  Plan Discussed with:  CRNA and Anesthesiologist  Anesthesia Plan Comments:        Anesthesia Quick Evaluation

## 2019-06-28 NOTE — Anesthesia Post-op Follow-up Note (Signed)
Anesthesia QCDR form completed.        

## 2019-06-28 NOTE — H&P (Signed)
THE PATIENT WAS SEEN PRIOR TO SURGERY TODAY.  HISTORY, ALLERGIES, HOME MEDICATIONS AND OPERATIVE PROCEDURE WERE REVIEWED. RISKS AND BENEFITS OF SURGERY DISCUSSED WITH PATIENT AGAIN.  NO CHANGES FROM INITIAL HISTORY AND PHYSICAL NOTED.    

## 2019-06-28 NOTE — Anesthesia Procedure Notes (Signed)
Spinal  Patient location during procedure: OR Start time: 06/28/2019 4:05 PM End time: 06/28/2019 4:20 PM Staffing Resident/CRNA: Nelda Marseille, CRNA Performed: resident/CRNA  Preanesthetic Checklist Completed: patient identified, site marked, surgical consent, pre-op evaluation, timeout performed, IV checked, risks and benefits discussed and monitors and equipment checked Spinal Block Patient position: sitting Prep: Betadine Patient monitoring: heart rate, continuous pulse ox, blood pressure and cardiac monitor Approach: midline Location: L4-5 Injection technique: single-shot Needle Needle type: Whitacre and Introducer  Needle gauge: 25 G Needle length: 9 cm Assessment Sensory level: T10 Additional Notes Negative paresthesia. Negative blood return. Positive free-flowing CSF. Expiration date of kit checked and confirmed. Patient tolerated procedure well, without complications.

## 2019-06-28 NOTE — Progress Notes (Signed)
[facility] NAME: Lisa [name]    MR#:  [id number]  DATE OF BIRTH:  05/06/1941  SUBJECTIVE:  C/o right foot pain after fall. Nurse reports confusion last night  REVIEW OF SYSTEMS:    Review of Systems  Constitutional: Negative for fever, chills weight loss HENT: Negative for ear pain, nosebleeds, congestion, facial swelling, rhinorrhea, neck pain, neck stiffness and ear discharge.   Respiratory: Negative for cough, shortness of breath, wheezing  Cardiovascular: Negative for chest pain, palpitations and leg swelling.  Gastrointestinal: Negative for heartburn, abdominal pain, vomiting, diarrhea or consitpation Genitourinary: Negative for dysuria, urgency, frequency, hematuria Musculoskeletal: Negative for back pain or ++ joint pain/foot pain Neurological: Negative for dizziness, seizures, syncope, focal weakness,  numbness and headaches.  Hematological: Does not bruise/bleed easily.  Psychiatric/Behavioral: Negative for hallucinations, confusion, dysphoric mood    Tolerating Diet: NPO      DRUG ALLERGIES:   Allergies  Allergen Reactions  . Shellfish-Derived Products Swelling  . Aspirin Other (See Comments)    Pt states, "I'm a free bleeder."  . Diazepam Other (See Comments)    Affected her hearing and sight  . Dairy Aid [Lactase] Nausea And Vomiting    VITALS:  Blood pressure 139/71, pulse 87, temperature 98 F (36.7 C), resp. rate 17, height 5\' 7"  (1.702 m), weight 77.1 kg, SpO2 97 %.  PHYSICAL EXAMINATION:  Constitutional: Appears well-developed and well-nourished. No distress. HENT: Normocephalic. Marland Kitchen Oropharynx is clear and moist.  Eyes: Conjunctivae and EOM are normal. PERRLA, no scleral icterus.  Neck: Normal ROM. Neck supple. No JVD. No tracheal deviation. CVS: RRR, S1/S2 +, no murmurs, no gallops, no carotid bruit.  Pulmonary: Effort and breath sounds normal, no stridor, rhonchi, wheezes, rales.  Abdominal:  Soft. BS +,  no distension, tenderness, rebound or guarding.  Musculoskeletal: Normal range of motion. No edema and no tenderness.  Neuro: Alert. CN 2-12 grossly intact. No focal deficits. Skin: Skin is warm and dry. No rash noted. Psychiatric: Normal mood and affect.      LABORATORY PANEL:   CBC Recent Labs  Lab 06/28/19 0324  WBC 14.3*  HGB 13.3  HCT 40.2  PLT 236   ------------------------------------------------------------------------------------------------------------------  Chemistries  Recent Labs  Lab 06/27/19 0615  NA 139  K 4.5  CL 104  CO2 25  GLUCOSE 107*  BUN 23  CREATININE 0.61  CALCIUM 8.9  AST 16  ALT 26  ALKPHOS 52  BILITOT 0.9   ------------------------------------------------------------------------------------------------------------------  Cardiac Enzymes No results for input(s): TROPONINI in the last 168 hours. ------------------------------------------------------------------------------------------------------------------  RADIOLOGY:  Dg Chest 1 View  Result Date: 06/26/2019 CLINICAL DATA:  Fall, left leg pain EXAM: CHEST  1 VIEW COMPARISON:  09/13/2018 FINDINGS: Cardiomegaly. There are bilateral calcified mediastinal and hilar lymph nodes as well as calcified pulmonary nodules. There is architectural distortion of the upper lobes of the lungs. The visualized skeletal structures are unremarkable. IMPRESSION: 1.  No acute abnormality of the lungs. 2. There are bilateral calcified mediastinal and hilar lymph nodes as well as calcified pulmonary nodules. There is architectural distortion of the upper lobes of the lungs. 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 3.  Cardiomegaly. Electronically Signed   By: Eddie Candle M.D.   On: 06/26/2019 11:03   Dg Knee 2 Views Left  Result Date: 06/26/2019 CLINICAL DATA:  Fall, pain EXAM: LEFT KNEE - 1-2 VIEW COMPARISON:  None. FINDINGS:  No fracture or  dislocation of the left knee. Mild osteopenia. Joint spaces are well preserved. No knee joint effusion. Soft tissues are unremarkable. IMPRESSION: No fracture or dislocation of the left knee. Electronically Signed   By: Eddie Candle M.D.   On: 06/26/2019 11:06   Dg Foot Complete Right  Result Date: 06/28/2019 CLINICAL DATA:  78 year old female with right foot pain status post fall. EXAM: RIGHT FOOT COMPLETE - 3+ VIEW COMPARISON:  None. FINDINGS: There is no acute fracture or dislocation. The bones are osteopenic. A fixation pin is noted in the distal first metatarsal. There is degenerative changes of the tarsometatarsal joints. The soft tissues are unremarkable. Vascular calcifications noted. IMPRESSION: No acute fracture or dislocation. Electronically Signed   By: Anner Crete M.D.   On: 06/28/2019 09:35   Dg Hip Unilat With Pelvis 2-3 Views Left  Result Date: 06/26/2019 CLINICAL DATA:  Fall, left hip pain EXAM: DG HIP (WITH OR WITHOUT PELVIS) 2-3V LEFT COMPARISON:  None. FINDINGS: There is a displaced and foreshortened transcervical fracture of the left femoral neck. The pelvis and right hip are unremarkable in single frontal view. Mild osteopenia. IMPRESSION: There is a displaced and foreshortened transcervical fracture of the left femoral neck. Electronically Signed   By: Eddie Candle M.D.   On: 06/26/2019 11:05     ASSESSMENT AND PLAN:   78 year old female with primary pulmonary hypertension, PAF and sarcoidosis who presented to the emergency room after mechanical fall.   1.  Displaced transcervical fracture of left femoral neck: Plan to go to OR this am. 48 hours after last dose of Eliquis FOOT XRAY ordered this am negative for acute fracture.   2.  PAF: Currently in normal sinus rhythm off of Eliquis temporarily for surgery. Heart rate controlled  3.  Primary pulmonary hypertension on Aldactone Followed at [facility]  4.  Sarcoidosis without flare  5.  Hyperlipidemia:  Continue Crestor   Management plans discussed with the patient and she is in agreement.  CODE STATUS: full  TOTAL TIME TAKING CARE OF THIS PATIENT: 25 minutes.     POSSIBLE D/C 2 days, DEPENDING ON CLINICAL CONDITION.   Bettey Costa M.D on 06/28/2019 at 10:24 AM  Between 7am to 6pm - Pager - [phone] After 6pm go to [email or web address] - password [email or web address] [facility]  Office  [phone]  CC: Primary care physician; Clarisse Gouge, MD  Note: This dictation was prepared with Dragon dictation along with smaller phrase technology. Any transcriptional errors that result from this process are unintentional.

## 2019-06-28 NOTE — Anesthesia Procedure Notes (Signed)
Date/Time: 06/28/2019 4:53 PM Performed by: Nelda Marseille, CRNA Pre-anesthesia Checklist: Patient identified, Emergency Drugs available, Suction available, Patient being monitored and Timeout performed Oxygen Delivery Method: Simple face mask

## 2019-06-28 NOTE — Transfer of Care (Signed)
Immediate Anesthesia Transfer of Care Note  Patient: Lisa [name]  Procedure(s) Performed: ARTHROPLASTY BIPOLAR HIP (HEMIARTHROPLASTY) (Left Hip)  Patient Location: PACU  Anesthesia Type:Spinal  Level of Consciousness: drowsy  Airway & Oxygen Therapy: Patient Spontanous Breathing and Patient connected to face mask oxygen  Post-op Assessment: Report given to RN and Post -op Vital signs reviewed and stable  Post vital signs: Reviewed and stable  Last Vitals:  Vitals Value Taken Time  BP 113/67 06/28/19 1830  Temp 36.2 C 06/28/19 1830  Pulse 87 06/28/19 1830  Resp 21 06/28/19 1833  SpO2 93 % 06/28/19 1830  Vitals shown include unvalidated device data.  Last Pain:  Vitals:   06/28/19 1455  TempSrc: Temporal  PainSc: 1       Patients Stated Pain Goal: 0 ([id number])  Complications: No apparent anesthesia complications

## 2019-06-29 LAB — BASIC METABOLIC PANEL
Anion gap: 9 (ref 5–15)
BUN: 14 mg/dL (ref 8–23)
CO2: 24 mmol/L (ref 22–32)
Calcium: 7.8 mg/dL — ABNORMAL LOW (ref 8.9–10.3)
Chloride: 101 mmol/L (ref 98–111)
Creatinine, Ser: 0.58 mg/dL (ref 0.44–1.00)
GFR calc Af Amer: >60 mL/min (ref >60)
GFR calc non Af Amer: >60 mL/min (ref >60)
Glucose, Bld: 90 mg/dL (ref 70–99)
Potassium: 3.8 mmol/L (ref 3.5–5.1)
Sodium: 134 mmol/L — ABNORMAL LOW (ref 135–145)

## 2019-06-29 LAB — CBC
HCT: 34.9 % — ABNORMAL LOW (ref 36.0–46.0)
Hemoglobin: 11.6 g/dL — ABNORMAL LOW (ref 12.0–15.0)
MCH: 31.6 pg (ref 26.0–34.0)
MCHC: 33.2 g/dL (ref 30.0–36.0)
MCV: 95.1 fL (ref 80.0–100.0)
Platelets: 215 10*3/uL (ref 150–400)
RBC: 3.67 MIL/uL — ABNORMAL LOW (ref 3.87–5.11)
RDW: 12.7 % (ref 11.5–15.5)
WBC: 13.1 10*3/uL — ABNORMAL HIGH (ref 4.0–10.5)
nRBC: 0 % (ref 0.0–0.2)

## 2019-06-29 MED ORDER — APIXABAN 5 MG PO TABS
5.0000 mg | ORAL_TABLET | Freq: Two times a day (BID) | ORAL | Status: DC
  Administered 2019-06-30: 5 mg via ORAL
  Filled 2019-06-29: qty 1

## 2019-06-29 MED ORDER — ENOXAPARIN SODIUM 40 MG/0.4ML ~~LOC~~ SOLN: 40.0000 mg | Freq: Once | SUBCUTANEOUS | Status: DC

## 2019-06-29 MED ORDER — ENOXAPARIN SODIUM 30 MG/0.3ML ~~LOC~~ SOLN
30.0000 mg | Freq: Once | SUBCUTANEOUS | Status: AC
  Administered 2019-06-29: 30 mg via SUBCUTANEOUS
  Filled 2019-06-29: qty 0.3

## 2019-06-29 MED ORDER — SODIUM CHLORIDE 0.9 % IV SOLN
INTRAVENOUS | Status: DC
  Administered 2019-06-29: 12:00:00 via INTRAVENOUS

## 2019-06-29 NOTE — Care Management Important Message (Signed)
Important Message  Patient Details  Name: Lisa [name] MRN: [id number] Date of Birth: 06-13-1941   Medicare Important Message Given:  Yes     Dannette Barbara 06/29/2019, 11:09 AM

## 2019-06-29 NOTE — TOC Benefit Eligibility Note (Signed)
Transition of Care [facility]) Benefit Eligibility Note    Patient Details  Name: Lisa [name] MRN: [id number] Date of Birth: 1941/02/21   Medication/Dose: Enoxaparin 40mg  once daily for 14 days.  Covered?: Yes  Prescription Coverage Preferred Pharmacy: [facility]  Spoke with Person/Company/Phone Number:: Edwena Felty with [employer] at [phone]  Co-Pay: $69.18 estimated copay  Prior Approval: No  Deductible: (No deductible on plan.)   Dannette Barbara Phone Number: [phone] or [phone] 06/29/2019, 3:10 PM

## 2019-06-29 NOTE — Anesthesia Postprocedure Evaluation (Signed)
Anesthesia Post Note  Patient: Lisa [name]  Procedure(s) Performed: ARTHROPLASTY BIPOLAR HIP (HEMIARTHROPLASTY) (Left Hip)  Patient location during evaluation: Nursing Unit Anesthesia Type: Spinal Level of consciousness: awake and alert and oriented Pain management: pain level controlled Vital Signs Assessment: post-procedure vital signs reviewed and stable Respiratory status: spontaneous breathing, nonlabored ventilation and respiratory function stable Cardiovascular status: stable Postop Assessment: no apparent nausea or vomiting, patient able to bend at knees, able to ambulate and adequate PO intake Anesthetic complications: no     Last Vitals:  Vitals:   06/29/19 0028 06/29/19 0127  BP: 123/77 122/60  Pulse: 82 81  Resp: 19 19  Temp: 36.6 C 36.6 C  SpO2: 99% 98%    Last Pain:  Vitals:   06/29/19 0649  TempSrc:   PainSc: Yolanda Manges

## 2019-06-29 NOTE — Progress Notes (Signed)
Subjective: 1 Day Post-Op Procedure(s) (LRB): ARTHROPLASTY BIPOLAR HIP (HEMIARTHROPLASTY) (Left)   Alert and sitting up in bed.  Little pain.  Labs stable Patient reports pain as mild.  Objective:   VITALS:   Vitals:   06/29/19 0127 06/29/19 0750  BP: 122/60 112/90  Pulse: 81 89  Resp: 19 18  Temp: 97.9 F (36.6 C) 98.2 F (36.8 C)  SpO2: 98% 94%    Neurologically intact ABD soft Neurovascular intact Sensation intact distally Intact pulses distally Dorsiflexion/Plantar flexion intact Incision: no drainage  LABS Recent Labs    06/27/19 0615 06/28/19 0324 06/29/19 0258  HGB 14.1 13.3 11.6*  HCT 43.8 40.2 34.9*  WBC 14.0* 14.3* 13.1*  PLT 239 236 215    Recent Labs    06/27/19 0615 06/28/19 0324 06/29/19 0258  NA 139  --  134*  K 4.5  --  3.8  BUN 23  --  14  CREATININE 0.61 0.54 0.58  GLUCOSE 107*  --  90    Recent Labs    06/27/19 0615  INR 1.1     Assessment/Plan: 1 Day Post-Op Procedure(s) (LRB): ARTHROPLASTY BIPOLAR HIP (HEMIARTHROPLASTY) (Left)   Advance diet Up with therapy D/C IV fluids Discharge to SNF

## 2019-06-29 NOTE — Progress Notes (Signed)
Physical Therapy Treatment Patient Details Name: Lisa [name] MRN: [id number] DOB: 1941-06-05 Today's Date: 06/29/2019    History of Present Illness 78 y/o female here after fall at [place] hip fracture.  S/p hemiarthroplasty, posterior approach.    PT Comments    Pt extremely motivated to do as much as she can as she is determined to do well enough to go home.  Pt was, admittedly, able to do much more than this morning session but safety with ambulation was highly suspect during this afternoon's effort.  She was able to walk ~15 ft with constant cuing, occasional mod assist (secondary to buckling and generally needing heavy cuing to stay upright, use walker appropriately, breath, etc)  Pt's HR rose quickly and by the end of the effort she was at 145 bpm with shortness of breath (low 90s on 2L) and severe fatigue.  Pt, at this point, is not safe to return home but remains highly motivated to do as much as she can to keep improving.   Follow Up Recommendations  SNF(despite great effort/motivation, per session still needs ST )     Equipment Recommendations       Recommendations for Other Services       Precautions / Restrictions Precautions Precautions: Fall;Posterior Hip Precaution Booklet Issued: Yes (comment) Restrictions LLE Weight Bearing: Partial weight bearing    Mobility  Bed Mobility Overal bed mobility: Needs Assistance Bed Mobility: Supine to Sit     Supine to sit: Mod assist     General bed mobility comments: Pt again showed determination in trying to get to EOB, but ultimately ended up needing considerable assist  Transfers Overall transfer level: Needs assistance Equipment used: Rolling walker (2 wheeled) Transfers: Sit to/from Stand Sit to Stand: Min assist;Mod assist         General transfer comment: Pt again with some issue on first attempt at standing w/o assist ("don't help, I want to try") but ultimately needed assist to initiate  getting up and heavier assist in getting hips forward/torso upright in walker  Ambulation/Gait Ambulation/Gait assistance: Min assist Gait Distance (Feet): 15 Feet Assistive device: Rolling walker (2 wheeled)       General Gait Details: Pt with very slow, unsteady and extremely labored effort to get to the doorway.  She had multiple bouts of knee buckling (occasionally needing assist to arrest) had difficulty maintaining proper posture/positioning in walker to use UEs appropriately, was short of breath most of the time (sats stayed in the 90s on 2L) and was very fatigued during and post ambulation.  HR was ~140 at the 10 ft mark but pt was highly determined to make it to the door despite considerable difficulty and HR was 145 with severe fatigue at reaching the door and PT encouraging her strongly to sit.   Stairs             Wheelchair Mobility    Modified Rankin (Stroke Patients Only)       Balance Overall balance assessment: Needs assistance Sitting-balance support: Bilateral upper extremity supported Sitting balance-Leahy Scale: Good       Standing balance-Leahy Scale: Poor Standing balance comment: static standing was reasonably good, during ambulation buckling, leaning forward and heavy UE reliance                            Cognition Arousal/Alertness: Lethargic Behavior During Therapy: WFL for tasks assessed/performed Overall Cognitive Status: Within Functional Limits for  tasks assessed                                        Exercises Total Joint Exercises Ankle Circles/Pumps: AROM;10 reps Quad Sets: Strengthening;10 reps Hip ABduction/ADduction: AROM;10 reps;AAROM Long Arc Quad: AROM;Strengthening;10 reps Knee Flexion: AROM;Strengthening;10 reps    General Comments        Pertinent Vitals/Pain Pain Score: 5     Home Living                      Prior Function            PT Goals (current goals can now be  found in the care plan section) Progress towards PT goals: Progressing toward goals    Frequency    BID      PT Plan Current plan remains appropriate    Co-evaluation              AM-PAC PT "6 Clicks" Mobility   Outcome Measure  Help needed turning from your back to your side while in a flat bed without using bedrails?: A Little Help needed moving from lying on your back to sitting on the side of a flat bed without using bedrails?: A Lot Help needed moving to and from a bed to a chair (including a wheelchair)?: A Lot Help needed standing up from a chair using your arms (e.g., wheelchair or bedside chair)?: A Lot Help needed to walk in hospital room?: A Lot Help needed climbing 3-5 steps with a railing? : Total 6 Click Score: 12    End of Session Equipment Utilized During Treatment: Gait belt Activity Tolerance: Patient limited by pain;Patient limited by fatigue Patient left: with call bell/phone within reach;with bed alarm set Nurse Communication: Mobility status PT Visit Diagnosis: Muscle weakness (generalized) (M62.81);Difficulty in walking, not elsewhere classified (R26.2);Pain Pain - Right/Left: Left Pain - part of body: Hip     Time: [id number] PT Time Calculation (min) (ACUTE ONLY): 31 min  Charges:  $Gait Training: 8-22 mins $Therapeutic Exercise: 8-22 mins                     Kreg Shropshire, DPT 06/29/2019, 5:05 PM

## 2019-06-29 NOTE — Progress Notes (Deleted)
Per Dr. Sabra Heck still give enoxaparin 30 mg despite eligible for dose adjustment to 40 mg based on Scr and weight

## 2019-06-29 NOTE — Progress Notes (Signed)
[place] at [place] NAME: Lisa [name]    MR#:  [id number]  DATE OF BIRTH:  [birth year]  SUBJECTIVE:  Patient post op doing ok this am Not c/o foot pain this am  REVIEW OF SYSTEMS:    Review of Systems  Constitutional: Negative for fever, chills weight loss HENT: Negative for ear pain, nosebleeds, congestion, facial swelling, rhinorrhea, neck pain, neck stiffness and ear discharge.   Respiratory: Negative for cough, shortness of breath, wheezing  Cardiovascular: Negative for chest pain, palpitations and leg swelling.  Gastrointestinal: Negative for heartburn, abdominal pain, vomiting, diarrhea or consitpation Genitourinary: Negative for dysuria, urgency, frequency, hematuria Musculoskeletal: Negative for back pain or ++ joint pain/foot pain Neurological: Negative for dizziness, seizures, syncope, focal weakness,  numbness and headaches.  Hematological: Does not bruise/bleed easily.  Psychiatric/Behavioral: Negative for hallucinations, confusion, dysphoric mood    Tolerating Diet: yes     DRUG ALLERGIES:   Allergies  Allergen Reactions  . Shellfish-Derived Products Swelling  . Aspirin Other (See Comments)    Pt states, "I'm a free bleeder."  . Diazepam Other (See Comments)    Affected her hearing and sight  . Dairy Aid [Lactase] Nausea And Vomiting    VITALS:  Blood pressure 112/90, pulse 89, temperature 98.2 F (36.8 C), temperature source Oral, resp. rate 18, height 5\' 7"  (1.702 m), weight 77.1 kg, SpO2 94 %.  PHYSICAL EXAMINATION:  Constitutional: Appears well-developed and well-nourished. No distress. HENT: Normocephalic. Marland Kitchen Oropharynx is clear and moist.  Eyes: Conjunctivae and EOM are normal. PERRLA, no scleral icterus.  Neck: Normal ROM. Neck supple. No JVD. No tracheal deviation. CVS: RRR, S1/S2 +, no murmurs, no gallops, no carotid bruit.  Pulmonary: Effort and breath sounds normal, no stridor, rhonchi, wheezes,  rales.  Abdominal: Soft. BS +,  no distension, tenderness, rebound or guarding.  Musculoskeletal: . No edema and no tenderness.  Neuro: Alert. CN 2-12 grossly intact. No focal deficits. Skin: Skin is warm and dry. No rash noted. Psychiatric: Normal mood and affect.      LABORATORY PANEL:   CBC Recent Labs  Lab 06/29/19 0258  WBC 13.1*  HGB 11.6*  HCT 34.9*  PLT 215   ------------------------------------------------------------------------------------------------------------------  Chemistries  Recent Labs  Lab 06/27/19 0615  06/29/19 0258  NA 139  --  134*  K 4.5  --  3.8  CL 104  --  101  CO2 25  --  24  GLUCOSE 107*  --  90  BUN 23  --  14  CREATININE 0.61   < > 0.58  CALCIUM 8.9  --  7.8*  AST 16  --   --   ALT 26  --   --   ALKPHOS 52  --   --   BILITOT 0.9  --   --    < > = values in this interval not displayed.   ------------------------------------------------------------------------------------------------------------------  Cardiac Enzymes No results for input(s): TROPONINI in the last 168 hours. ------------------------------------------------------------------------------------------------------------------  RADIOLOGY:  Dg Foot Complete Right  Result Date: 06/28/2019 CLINICAL DATA:  78 year old female with right foot pain status post fall. EXAM: RIGHT FOOT COMPLETE - 3+ VIEW COMPARISON:  None. FINDINGS: There is no acute fracture or dislocation. The bones are osteopenic. A fixation pin is noted in the distal first metatarsal. There is degenerative changes of the tarsometatarsal joints. The soft tissues are unremarkable. Vascular calcifications noted. IMPRESSION: No acute fracture or dislocation. Electronically Signed   By: Milas Hock  Radparvar M.D.   On: 06/28/2019 09:35   Dg Hip Port Unilat With Pelvis 1v Left  Result Date: 06/28/2019 CLINICAL DATA:  Status post left hip hemiarthroplasty EXAM: DG HIP (WITH OR WITHOUT PELVIS) 1V PORT LEFT COMPARISON:   06/26/2019 FINDINGS: Interval left hip replacement with intact hardware and normal alignment. Gas within the soft tissues consistent with recent surgery. Cutaneous staples over the left hip. Pubic symphysis and rami appear intact. IMPRESSION: Interval left hip replacement with expected postsurgical changes Electronically Signed   By: Donavan Foil M.D.   On: 06/28/2019 19:47     ASSESSMENT AND PLAN:   78 year old female with primary pulmonary hypertension, PAF and sarcoidosis who presented to the emergency room after mechanical fall.   1.  Displaced transcervical fracture of left femoral neck: She is postoperative day #1 left hip hemiarthroplasty DVT prophylaxis as per Dr. Sabra Heck  FOOT XRAY negative for acute fracture.   2.  PAF: Patient is restarted on Eliquis Heart rate controlled  3.  Primary pulmonary hypertension on Aldactone Followed at [facility]  4.  Sarcoidosis without flare  5.  Hyperlipidemia: Continue Crestor   Physical therapy consultation for discharge planning  Management plans discussed with the patient and she is in agreement.  CODE STATUS: full  TOTAL TIME TAKING CARE OF THIS PATIENT: 25 minutes.     POSSIBLE D/C tomorrow DEPENDING ON CLINICAL CONDITION.   Bettey Costa M.D on 06/29/2019 at 10:38 AM  Between 7am to 6pm - Pager - [phone] After 6pm go to [email or web address] - password [email or web address] [facility]  Office  [phone]  CC: Primary care physician; Clarisse Gouge, MD  Note: This dictation was prepared with Dragon dictation along with smaller phrase technology. Any transcriptional errors that result from this process are unintentional.

## 2019-06-29 NOTE — Evaluation (Signed)
Physical Therapy Evaluation Patient Details Name: Lisa [name] MRN: [id number] DOB: [birth year] Today's Date: 06/29/2019   History of Present Illness  78 y/o female here after fall at [place] hip fracture.  S/p hemiarthroplasty, posterior approach.  Clinical Impression  Pt sleepy w/o the session (even fell asleep a few times during supine exercises) but was pleasant and very motivated to do all she can.  She showed good effort but was limited with all functional acts secondary to pain and when we stood up she had lightheadedness that forced pt to sit down very abruptly (needing PT assist to insure she landed on the bed).  Pt was very active and independent prior to the hip fx and is motivated to go home (daughter there 24/7) but knows she will need to do better with PT here in the hospital for this to be a safe proposition.     Follow Up Recommendations SNF(per progress, pt very motivated to go home)    Equipment Recommendations  Rolling walker with 5" wheels    Recommendations for Other Services       Precautions / Restrictions Precautions Precautions: Fall;Posterior Hip Restrictions Weight Bearing Restrictions: Yes LLE Weight Bearing: Partial weight bearing      Mobility  Bed Mobility Overal bed mobility: Needs Assistance Bed Mobility: Supine to Sit;Sit to Supine     Supine to sit: Mod assist Sit to supine: Mod assist   General bed mobility comments: Pt was able to very minimally shift hips, ultimatley needed considerable assist to attain sitting, more assist to get back into bed and to scoot up in bed  Transfers Overall transfer level: Needs assistance Equipment used: Rolling walker (2 wheeled) Transfers: Sit to/from Stand Sit to Stand: Min assist         General transfer comment: Pt was unable to rise to standing on first attempt w/o phyiscal assist, after additional cuing and with some assist she was able to attain standing with heavy UE  use  Ambulation/Gait Ambulation/Gait assistance: Min assist Gait Distance (Feet): 2 Feet Assistive device: Rolling walker (2 wheeled)       General Gait Details: Pt was able to take a few very small, hesitant steps and then states "I need to sit, I feel like I'm going to pass out."  Deferred further ambulation attempt this AM.  Stairs            Wheelchair Mobility    Modified Rankin (Stroke Patients Only)       Balance Overall balance assessment: Needs assistance Sitting-balance support: Bilateral upper extremity supported Sitting balance-Leahy Scale: Good       Standing balance-Leahy Scale: Fair(poor tolerance)                               Pertinent Vitals/Pain Pain Assessment: 0-10 Pain Score: 6  Pain Location: pain increases with any mobility    Home Living Family/patient expects to be discharged to:: Private residence Living Arrangements: Children(daughter) Available Help at Discharge: Family Type of Home: Apartment Home Access: Level entry       Home Equipment: None      Prior Function Level of Independence: Independent         Comments: Pt able to do all she needs, drives, runs errands, etc     Hand Dominance        Extremity/Trunk Assessment   Upper Extremity Assessment Upper Extremity Assessment: Generalized weakness;Overall [facility] for tasks  assessed    Lower Extremity Assessment Lower Extremity Assessment: (R grossly 4 to 4-/5, L with expected post-op weakness)       Communication   Communication: No difficulties  Cognition Arousal/Alertness: Lethargic;Suspect due to medications Behavior During Therapy: WFL for tasks assessed/performed Overall Cognitive Status: Within Functional Limits for tasks assessed                                        General Comments General comments (skin integrity, edema, etc.): post standing BP taken back in supine: 153/84    Exercises Total Joint Exercises Ankle  Circles/Pumps: AROM;10 reps Quad Sets: Strengthening;10 reps Short Arc Quad: AAROM;10 reps Heel Slides: AAROM;10 reps Hip ABduction/ADduction: AROM;10 reps;AAROM Straight Leg Raises: PROM;5 reps(too much pain)   Assessment/Plan    PT Assessment Patient needs continued PT services  PT Problem List Decreased strength;Decreased range of motion;Decreased activity tolerance;Decreased balance;Decreased mobility;Decreased coordination;Decreased knowledge of use of DME;Decreased safety awareness;Pain       PT Treatment Interventions DME instruction;Stair training;Gait training;Functional mobility training;Therapeutic activities;Therapeutic exercise;Balance training;Neuromuscular re-education;Patient/family education    PT Goals (Current goals can be found in the Care Plan section)  Acute Rehab PT Goals Patient Stated Goal: go home PT Goal Formulation: With patient Time For Goal Achievement: 07/13/19 Potential to Achieve Goals: Fair    Frequency BID   Barriers to discharge        Co-evaluation               AM-PAC PT "6 Clicks" Mobility  Outcome Measure Help needed turning from your back to your side while in a flat bed without using bedrails?: A Little Help needed moving from lying on your back to sitting on the side of a flat bed without using bedrails?: A Lot Help needed moving to and from a bed to a chair (including a wheelchair)?: A Lot Help needed standing up from a chair using your arms (e.g., wheelchair or bedside chair)?: A Lot Help needed to walk in hospital room?: Total Help needed climbing 3-5 steps with a railing? : Total 6 Click Score: 11    End of Session Equipment Utilized During Treatment: Gait belt Activity Tolerance: Patient limited by pain;Patient limited by fatigue Patient left: with call bell/phone within reach;with bed alarm set Nurse Communication: Mobility status PT Visit Diagnosis: Muscle weakness (generalized) (M62.81);Difficulty in walking, not  elsewhere classified (R26.2);Pain Pain - Right/Left: Left Pain - part of body: Hip    Time: [id number] PT Time Calculation (min) (ACUTE ONLY): 42 min   Charges:   PT Evaluation $PT Eval Low Complexity: 1 Low PT Treatments $Therapeutic Exercise: 8-22 mins $Therapeutic Activity: 8-22 mins        Kreg Shropshire, DPT 06/29/2019, 1:30 PM

## 2019-06-30 LAB — CBC
HCT: 34.3 % — ABNORMAL LOW (ref 36.0–46.0)
Hemoglobin: 11.7 g/dL — ABNORMAL LOW (ref 12.0–15.0)
MCH: 32.1 pg (ref 26.0–34.0)
MCHC: 34.1 g/dL (ref 30.0–36.0)
MCV: 94 fL (ref 80.0–100.0)
Platelets: 221 10*3/uL (ref 150–400)
RBC: 3.65 MIL/uL — ABNORMAL LOW (ref 3.87–5.11)
RDW: 12.4 % (ref 11.5–15.5)
WBC: 14.8 10*3/uL — ABNORMAL HIGH (ref 4.0–10.5)
nRBC: 0 % (ref 0.0–0.2)

## 2019-06-30 LAB — BASIC METABOLIC PANEL
Anion gap: 10 (ref 5–15)
BUN: 9 mg/dL (ref 8–23)
CO2: 29 mmol/L (ref 22–32)
Calcium: 8 mg/dL — ABNORMAL LOW (ref 8.9–10.3)
Chloride: 98 mmol/L (ref 98–111)
Creatinine, Ser: 0.5 mg/dL (ref 0.44–1.00)
GFR calc Af Amer: >60 mL/min (ref >60)
GFR calc non Af Amer: >60 mL/min (ref >60)
Glucose, Bld: 118 mg/dL — ABNORMAL HIGH (ref 70–99)
Potassium: 3.7 mmol/L (ref 3.5–5.1)
Sodium: 137 mmol/L (ref 135–145)

## 2019-06-30 LAB — SARS CORONAVIRUS 2 BY RT PCR (HOSPITAL ORDER, PERFORMED IN ~~LOC~~ HOSPITAL LAB): SARS Coronavirus 2: NEGATIVE

## 2019-06-30 MED ORDER — OXYCODONE HCL 5 MG PO TABS: 5.0000 mg | ORAL_TABLET | ORAL | 0 refills | Status: DC | PRN

## 2019-06-30 MED ORDER — FERROUS SULFATE 325 (65 FE) MG PO TABS: 325.0000 mg | ORAL_TABLET | Freq: Every day | ORAL | 3 refills | Status: AC

## 2019-06-30 NOTE — Progress Notes (Signed)
[place] at [place] NAME: Lisa [name]    MR#:  [id number]  DATE OF BIRTH:  08-01-1941  SUBJECTIVE:  Patient without any issues overnight.  Physical therapy has recommended skilled nursing facility upon discharge.  Foot pain has improved. Denies hip pain.  REVIEW OF SYSTEMS:    Review of Systems  Constitutional: Negative for fever, chills weight loss HENT: Negative for ear pain, nosebleeds, congestion, facial swelling, rhinorrhea, neck pain, neck stiffness and ear discharge.   Respiratory: Negative for cough, shortness of breath, wheezing  Cardiovascular: Negative for chest pain, palpitations and leg swelling.  Gastrointestinal: Negative for heartburn, abdominal pain, vomiting, diarrhea or consitpation Genitourinary: Negative for dysuria, urgency, frequency, hematuria Musculoskeletal: Negative for back pain or  Neurological: Negative for dizziness, seizures, syncope, focal weakness,  numbness and headaches.  Hematological: Does not bruise/bleed easily.  Psychiatric/Behavioral: Negative for hallucinations, confusion, dysphoric mood    Tolerating Diet: yes     DRUG ALLERGIES:   Allergies  Allergen Reactions  . Shellfish-Derived Products Swelling  . Aspirin Other (See Comments)    Pt states, "I'm a free bleeder."  . Diazepam Other (See Comments)    Affected her hearing and sight  . Dairy Aid [Lactase] Nausea And Vomiting    VITALS:  Blood pressure 125/63, pulse 99, temperature 99 F (37.2 C), temperature source Oral, resp. rate 17, height 5\' 7"  (1.702 m), weight 77.1 kg, SpO2 94 %.  PHYSICAL EXAMINATION:  Constitutional: Appears well-developed and well-nourished. No distress. HENT: Normocephalic. Marland Kitchen Oropharynx is clear and moist.  Eyes: Conjunctivae and EOM are normal. PERRLA, no scleral icterus.  Neck: Normal ROM. Neck supple. No JVD. No tracheal deviation. CVS: RRR, S1/S2 +, no murmurs, no gallops, no carotid bruit.   Pulmonary: Effort and breath sounds normal, no stridor, rhonchi, wheezes, rales.  Abdominal: Soft. BS +,  no distension, tenderness, rebound or guarding.  Musculoskeletal: . No edema and no tenderness.  Neuro: Alert. CN 2-12 grossly intact. No focal deficits. Skin: Skin is warm and dry. No rash noted. Psychiatric: Normal mood and affect.      LABORATORY PANEL:   CBC Recent Labs  Lab 06/30/19 0358  WBC 14.8*  HGB 11.7*  HCT 34.3*  PLT 221   ------------------------------------------------------------------------------------------------------------------  Chemistries  Recent Labs  Lab 06/27/19 0615  06/30/19 0358  NA 139   < > 137  K 4.5   < > 3.7  CL 104   < > 98  CO2 25   < > 29  GLUCOSE 107*   < > 118*  BUN 23   < > 9  CREATININE 0.61   < > 0.50  CALCIUM 8.9   < > 8.0*  AST 16  --   --   ALT 26  --   --   ALKPHOS 52  --   --   BILITOT 0.9  --   --    < > = values in this interval not displayed.   ------------------------------------------------------------------------------------------------------------------  Cardiac Enzymes No results for input(s): TROPONINI in the last 168 hours. ------------------------------------------------------------------------------------------------------------------  RADIOLOGY:  Dg Hip Port Unilat With Pelvis 1v Left  Result Date: 06/28/2019 CLINICAL DATA:  Status post left hip hemiarthroplasty EXAM: DG HIP (WITH OR WITHOUT PELVIS) 1V PORT LEFT COMPARISON:  06/26/2019 FINDINGS: Interval left hip replacement with intact hardware and normal alignment. Gas within the soft tissues consistent with recent surgery. Cutaneous staples over the left hip. Pubic symphysis and rami appear intact. IMPRESSION: Interval  left hip replacement with expected postsurgical changes Electronically Signed   By: Donavan Foil M.D.   On: 06/28/2019 19:47     ASSESSMENT AND PLAN:   78 year old female with primary pulmonary hypertension, PAF and sarcoidosis  who presented to the emergency room after mechanical fall.   1.  Displaced transcervical fracture of left femoral neck: She is postoperative day #2  left hip hemiarthroplasty DVT prophylaxis with Eliquis. Patient will follow-up with Dr. Sabra Heck in 2 weeks. Weightbearing as tolerated.  Her foot pain has improved and there is no acute fracture on x-ray.   2.  PAF: Patient is on Eliquis Heart rate has been controlled  3.  Primary pulmonary hypertension on Aldactone Follows at [facility]  4.  Sarcoidosis without flare  5.  Hyperlipidemia: Continue Crestor   Physical therapy is recommended skilled nursing facility upon discharge.  Case discussed with clinical Education officer, museum. Management plans discussed with the patient and she is in agreement.  CODE STATUS: full  TOTAL TIME TAKING CARE OF THIS PATIENT: 25 minutes.     POSSIBLE D/C today depending on insurance approval. Bettey Costa M.D on 06/30/2019 at 9:58 AM  Between 7am to 6pm - Pager - [phone] After 6pm go to [email or web address] - password [email or web address] [place] [facility]  Office  [phone]  CC: Primary care physician; Clarisse Gouge, MD  Note: This dictation was prepared with Dragon dictation along with smaller phrase technology. Any transcriptional errors that result from this process are unintentional.

## 2019-06-30 NOTE — Progress Notes (Signed)
Physical Therapy Treatment Patient Details Name: Lisa [name] MRN: [id number] DOB: 10/03/1941 Today's Date: 06/30/2019    History of Present Illness 78 y/o female here after fall at [facility] hip fracture.  S/p hemiarthroplasty, posterior approach, PWB.    PT Comments    Pt in bed upon arrival, somewhat drowsy appearing which she reports, has limited recollection or working with PT yesterday, no awareness of hip precautions or WB restrictions. Pt cognitively struggling with following cues for hand placement, technique, and safety. Need assist to perform bed exercises, modA to EOB, mod-maxA to rise from elevated EOB with RW. AMB is labored, slow, and with poor gross motor control of LLE. After pivot to chair pt with HR 127bpm. Several STS transfers practiced from EOB with SpO2 dropping to 86% on room air, but pt remaining WNL SpO2 on 1L/min. Pt still limited by weakness overall, unsafe to progress AMB at this time.   Follow Up Recommendations  SNF     Equipment Recommendations  Rolling walker with 5" wheels    Recommendations for Other Services       Precautions / Restrictions Precautions Precautions: Fall;Posterior Hip Precaution Booklet Issued: Yes (comment) Precaution Comments: Pt struggles with concepts and practice Restrictions Weight Bearing Restrictions: Yes LLE Weight Bearing: Partial weight bearing    Mobility  Bed Mobility Overal bed mobility: Needs Assistance Bed Mobility: Supine to Sit     Supine to sit: Mod assist     General bed mobility comments: Pt again showed determination in trying to get to EOB, but ultimately ended up needing considerable assist  Transfers Overall transfer level: Needs assistance Equipment used: Rolling walker (2 wheeled) Transfers: Sit to/from [employer] Transfers Sit to Stand: Max assist;Mod assist Stand pivot transfers: Mod assist(requires several minutes with Left kne ebuckling)       General transfer  comment: poor control of LLE with dynamic valgus upon rising. VC to avoid.  Ambulation/Gait Ambulation/Gait assistance: (unable to attempt, great difficulty with SPT to chair, and HR elevated.)               Stairs             Wheelchair Mobility    Modified Rankin (Stroke Patients Only)       Balance Overall balance assessment: Needs assistance Sitting-balance support: Feet supported;No upper extremity supported Sitting balance-Leahy Scale: Good     Standing balance support: Bilateral upper extremity supported;During functional activity Standing balance-Leahy Scale: Poor Standing balance comment: buckling in LLE while stepping.                            Cognition Arousal/Alertness: Lethargic Behavior During Therapy: WFL for tasks assessed/performed Overall Cognitive Status: Within Functional Limits for tasks assessed                                        Exercises Total Joint Exercises Heel Slides: AAROM;15 reps;Supine Hip ABduction/ADduction: AROM;AAROM;15 reps;Supine    General Comments        Pertinent Vitals/Pain Pain Assessment: 0-10 Pain Score: 1  Pain Location: pain increases with any mobility Pain Descriptors / Indicators: Aching Pain Intervention(s): Limited activity within patient's tolerance;Monitored during session;Premedicated before session;Repositioned    Home Living                      Prior  Function            PT Goals (current goals can now be found in the care plan section) Acute Rehab PT Goals Patient Stated Goal: go home PT Goal Formulation: With patient Time For Goal Achievement: 07/13/19 Potential to Achieve Goals: Fair Progress towards PT goals: Not progressing toward goals - comment    Frequency    BID      PT Plan Current plan remains appropriate    Co-evaluation              AM-PAC PT "6 Clicks" Mobility   Outcome Measure  Help needed turning from your  back to your side while in a flat bed without using bedrails?: A Lot Help needed moving from lying on your back to sitting on the side of a flat bed without using bedrails?: A Lot Help needed moving to and from a bed to a chair (including a wheelchair)?: A Lot Help needed standing up from a chair using your arms (e.g., wheelchair or bedside chair)?: A Lot Help needed to walk in hospital room?: Total Help needed climbing 3-5 steps with a railing? : Total 6 Click Score: 10    End of Session Equipment Utilized During Treatment: Gait belt Activity Tolerance: Patient limited by pain;Patient limited by fatigue;Treatment limited secondary to medical complications (Comment)(tachycardia @ 127bpm) Patient left: in chair;with call bell/phone within reach;with chair alarm set;with SCD's reapplied Nurse Communication: Mobility status PT Visit Diagnosis: Muscle weakness (generalized) (M62.81);Difficulty in walking, not elsewhere classified (R26.2);Pain Pain - Right/Left: Left Pain - part of body: Hip     Time: 1153-1227 PT Time Calculation (min) (ACUTE ONLY): 34 min  Charges:  $Gait Training: 8-22 mins $Therapeutic Exercise: 8-22 mins                     1:38 PM, 06/30/19 Etta Grandchild, PT, DPT Physical Therapist - [facility]  [phone] ([place])    Billyjack Trompeter C 06/30/2019, 1:34 PM

## 2019-06-30 NOTE — Progress Notes (Signed)
Patient being transferred to [facility] healthcare facility today. All personal belongings with patient. Patient offers no c/o of s/sx of distress.  AVS in patient packet for Rehab staff/faciility.

## 2019-06-30 NOTE — Progress Notes (Signed)
Attempted to call report to [facility] where patient is being transferred to for continued rehab services. Patient and family aware of transfer. No answer at facility. Will provided number for staff to call once patient arrives for report.

## 2019-06-30 NOTE — Discharge Summary (Signed)
[facility] [place] NAME: Lisa [name]    MR#:  [id number]  DATE OF BIRTH:  1941-09-16  DATE OF ADMISSION:  06/26/2019 ADMITTING PHYSICIAN: Nicholes Mango, MD  DATE OF DISCHARGE: 06/30/2019  PRIMARY CARE PHYSICIAN: Clarisse Gouge, MD    ADMISSION DIAGNOSIS:  Pre-op exam [Z01.818] Closed fracture of left hip, initial encounter ([place]) [S72.002A]  DISCHARGE DIAGNOSIS:  Active Problems:   Hip fracture ([place])   SECONDARY DIAGNOSIS:   Past Medical History:  Diagnosis Date  . Arthritis   . Asthma   . Cancer (HCC)    anal  . Carotid stenosis   . Colon polyp 05/22/2013  . Foot mass   . Glaucoma (increased eye pressure)   . Headache   . Heart murmur   . Hypercholesterolemia   . Hyperlipidemia   . Irregular heartbeat   . Localized primary osteoarthritis of lower leg   . Macular degeneration   . Mitral valve prolapse   . Neck pain   . Osteopenia   . Sarcoidosis   . Sarcoidosis   . Shortness of breath dyspnea    with exertion  . TIA (transient ischemic attack)     HOSPITAL COURSE:   78 year old female with primary pulmonary hypertension, PAF and sarcoidosis who presented to the emergency room after mechanical fall.   1.  Displaced transcervical fracture of left femoral neck: She is postoperative day #2  left hip hemiarthroplasty DVT prophylaxis with Eliquis. Patient will follow-up with Dr. Sabra Heck in 2 weeks. Weightbearing as tolerated.  Her foot pain has improved and there is no acute fracture on x-ray.   2.  PAF: Patient is on Eliquis Heart rate has been controlled  3.  Primary pulmonary hypertension on Aldactone Follows at [facility]  4.  Sarcoidosis without flare  5.  Hyperlipidemia: Continue Crestor   DISCHARGE CONDITIONS AND DIET:  Stable Regular diet  CONSULTS OBTAINED:    DRUG ALLERGIES:   Allergies  Allergen Reactions  . Shellfish-Derived Products Swelling  . Aspirin Other (See Comments)    Pt states,  "I'm a free bleeder."  . Diazepam Other (See Comments)    Affected her hearing and sight  . Dairy Aid [Lactase] Nausea And Vomiting    DISCHARGE MEDICATIONS:   Allergies as of 06/30/2019      Reactions   Shellfish-derived Products Swelling   Aspirin Other (See Comments)   Pt states, "I'm a free bleeder."   Diazepam Other (See Comments)   Affected her hearing and sight   Dairy Aid [lactase] Nausea And Vomiting      Medication List    TAKE these medications   albuterol 108 (90 Base) MCG/ACT inhaler Commonly known as: ProAir HFA Inhale 2 puffs into the lungs every 6 (six) hours as needed for wheezing or shortness of breath.   Alphagan P 0.15 % ophthalmic solution Generic drug: brimonidine Place 2 drops into the left eye 2 (two) times daily.   beclomethasone 80 MCG/ACT inhaler Commonly known as: QVAR Inhale 1 puff into the lungs 2 (two) times a day.   budesonide 3 MG 24 hr capsule Commonly known as: ENTOCORT EC Take 9 mg by mouth daily.   cholecalciferol 1000 units tablet Commonly known as: VITAMIN D Take 1,000 Units by mouth daily.   Eliquis 5 MG Tabs tablet Generic drug: apixaban Take 5 mg by mouth 2 (two) times daily.   ferrous sulfate 325 (65 FE) MG tablet Take 1 tablet (325 mg total) by  mouth daily with breakfast.   furosemide 40 MG tablet Commonly known as: LASIX Take 40 mg by mouth daily.   lidocaine 5 % Commonly known as: LIDODERM Place 1 patch onto the skin daily as needed. Remove & Discard patch within 12 hours or as directed by MD What changed: Another medication with the same name was removed. Continue taking this medication, and follow the directions you see here.   magnesium oxide 400 MG tablet Commonly known as: MAG-OX Take 400 mg by mouth daily.   multivitamin with minerals Tabs tablet Take 1 tablet by mouth daily. Centrum Silver   oxyCODONE 5 MG immediate release tablet Commonly known as: Oxy IR/ROXICODONE Take 1 tablet (5 mg total) by  mouth every 4 (four) hours as needed for moderate pain or breakthrough pain.   pantoprazole 40 MG tablet Commonly known as: Protonix Take 1 tablet (40 mg total) by mouth daily.   potassium chloride 10 MEQ tablet Commonly known as: K-DUR Take 10-20 mEq by mouth 2 (two) times daily.   PRESERVISION AREDS PO Take 1 capsule by mouth daily.   rosuvastatin 5 MG tablet Commonly known as: CRESTOR Take 5 mg by mouth every morning.   spironolactone 50 MG tablet Commonly known as: ALDACTONE Take 50 mg by mouth daily.   zolpidem 5 MG tablet Commonly known as: AMBIEN Take 5 mg by mouth at bedtime.         Today   CHIEF COMPLAINT:  Doing well this am No issues overnight   VITAL SIGNS:  Blood pressure 125/63, pulse 99, temperature 99 F (37.2 C), temperature source Oral, resp. rate 17, height 5\' 7"  (1.702 m), weight 77.1 kg, SpO2 94 %.   REVIEW OF SYSTEMS:  Review of Systems  Constitutional: Negative.  Negative for chills, fever and malaise/fatigue.  HENT: Negative.  Negative for ear discharge, ear pain, hearing loss, nosebleeds and sore throat.   Eyes: Negative.  Negative for blurred vision and pain.  Respiratory: Negative.  Negative for cough, hemoptysis, shortness of breath and wheezing.   Cardiovascular: Negative.  Negative for chest pain, palpitations and leg swelling.  Gastrointestinal: Negative.  Negative for abdominal pain, blood in stool, diarrhea, nausea and vomiting.  Genitourinary: Negative.  Negative for dysuria.  Musculoskeletal: Negative.  Negative for back pain.  Skin: Negative.   Neurological: Negative for dizziness, tremors, speech change, focal weakness, seizures and headaches.  Endo/Heme/Allergies: Negative.  Does not bruise/bleed easily.  Psychiatric/Behavioral: Negative.  Negative for depression, hallucinations and suicidal ideas.     PHYSICAL EXAMINATION:  GENERAL:  78 y.o.-year-old patient lying in the bed with no acute distress.  NECK:  Supple,  no jugular venous distention. No thyroid enlargement, no tenderness.  LUNGS: Normal breath sounds bilaterally, no wheezing, rales,rhonchi  No use of accessory muscles of respiration.  CARDIOVASCULAR: S1, S2 normal. No murmurs, rubs, or gallops.  ABDOMEN: Soft, non-tender, non-distended. Bowel sounds present. No organomegaly or mass.  EXTREMITIES: No pedal edema, cyanosis, or clubbing.  PSYCHIATRIC: The patient is alert and oriented x 3.  SKIN: No obvious rash, lesion, or ulcer.   DATA REVIEW:   CBC Recent Labs  Lab 06/30/19 0358  WBC 14.8*  HGB 11.7*  HCT 34.3*  PLT 221    Chemistries  Recent Labs  Lab 06/27/19 0615  06/30/19 0358  NA 139   < > 137  K 4.5   < > 3.7  CL 104   < > 98  CO2 25   < > 29  GLUCOSE  107*   < > 118*  BUN 23   < > 9  CREATININE 0.61   < > 0.50  CALCIUM 8.9   < > 8.0*  AST 16  --   --   ALT 26  --   --   ALKPHOS 52  --   --   BILITOT 0.9  --   --    < > = values in this interval not displayed.    Cardiac Enzymes No results for input(s): TROPONINI in the last 168 hours.  Microbiology Results  @MICRORSLT48 @  RADIOLOGY:  Dg Hip Port Unilat With Pelvis 1v Left  Result Date: 06/28/2019 CLINICAL DATA:  Status post left hip hemiarthroplasty EXAM: DG HIP (WITH OR WITHOUT PELVIS) 1V PORT LEFT COMPARISON:  06/26/2019 FINDINGS: Interval left hip replacement with intact hardware and normal alignment. Gas within the soft tissues consistent with recent surgery. Cutaneous staples over the left hip. Pubic symphysis and rami appear intact. IMPRESSION: Interval left hip replacement with expected postsurgical changes Electronically Signed   By: Donavan Foil M.D.   On: 06/28/2019 19:47      Allergies as of 06/30/2019      Reactions   Shellfish-derived Products Swelling   Aspirin Other (See Comments)   Pt states, "I'm a free bleeder."   Diazepam Other (See Comments)   Affected her hearing and sight   Dairy Aid [lactase] Nausea And Vomiting       Medication List    TAKE these medications   albuterol 108 (90 Base) MCG/ACT inhaler Commonly known as: ProAir HFA Inhale 2 puffs into the lungs every 6 (six) hours as needed for wheezing or shortness of breath.   Alphagan P 0.15 % ophthalmic solution Generic drug: brimonidine Place 2 drops into the left eye 2 (two) times daily.   beclomethasone 80 MCG/ACT inhaler Commonly known as: QVAR Inhale 1 puff into the lungs 2 (two) times a day.   budesonide 3 MG 24 hr capsule Commonly known as: ENTOCORT EC Take 9 mg by mouth daily.   cholecalciferol 1000 units tablet Commonly known as: VITAMIN D Take 1,000 Units by mouth daily.   Eliquis 5 MG Tabs tablet Generic drug: apixaban Take 5 mg by mouth 2 (two) times daily.   ferrous sulfate 325 (65 FE) MG tablet Take 1 tablet (325 mg total) by mouth daily with breakfast.   furosemide 40 MG tablet Commonly known as: LASIX Take 40 mg by mouth daily.   lidocaine 5 % Commonly known as: LIDODERM Place 1 patch onto the skin daily as needed. Remove & Discard patch within 12 hours or as directed by MD What changed: Another medication with the same name was removed. Continue taking this medication, and follow the directions you see here.   magnesium oxide 400 MG tablet Commonly known as: MAG-OX Take 400 mg by mouth daily.   multivitamin with minerals Tabs tablet Take 1 tablet by mouth daily. Centrum Silver   oxyCODONE 5 MG immediate release tablet Commonly known as: Oxy IR/ROXICODONE Take 1 tablet (5 mg total) by mouth every 4 (four) hours as needed for moderate pain or breakthrough pain.   pantoprazole 40 MG tablet Commonly known as: Protonix Take 1 tablet (40 mg total) by mouth daily.   potassium chloride 10 MEQ tablet Commonly known as: K-DUR Take 10-20 mEq by mouth 2 (two) times daily.   PRESERVISION AREDS PO Take 1 capsule by mouth daily.   rosuvastatin 5 MG tablet Commonly known as: CRESTOR Take 5  mg by mouth every  morning.   spironolactone 50 MG tablet Commonly known as: ALDACTONE Take 50 mg by mouth daily.   zolpidem 5 MG tablet Commonly known as: AMBIEN Take 5 mg by mouth at bedtime.         Management plans discussed with the patient and she is in agreement. Stable for discharge   Patient should follow up with ortho  CODE STATUS:     Code Status Orders  (From admission, onward)         Start     Ordered   06/26/19 1408  Full code  Continuous     06/26/19 1407        Code Status History    Date Active Date Inactive Code Status Order ID Comments User Context   04/16/2019 1322 04/18/2019 1721 Full Code [id number]  Loletha Grayer, MD ED   04/06/2017 0955 04/06/2017 1933 Full Code [id number]  Saundra Shelling, MD Inpatient   Advance Care Planning Activity    Advance Directive Documentation     Most Recent Value  Type of Advance Directive  Healthcare Power of Attorney  Pre-existing out of facility DNR order (yellow form or pink MOST form)  -  "MOST" Form in Place?  -      TOTAL TIME TAKING CARE OF THIS PATIENT: 39 minutes.    Note: This dictation was prepared with Dragon dictation along with smaller phrase technology. Any transcriptional errors that result from this process are unintentional.  Bettey Costa M.D on 06/30/2019 at 10:00 AM  Between 7am to 6pm - Pager - [phone] After 6pm go to [email or web address] - password [email or web address] [facility]  Office  [phone]  CC: Primary care physician; Clarisse Gouge, MD

## 2019-06-30 NOTE — TOC Transition Note (Signed)
Transition of Care [facility]) - CM/SW Discharge Note   Patient Details  Name: Lisa [name] MRN: [id number] Date of Birth: 29-Mar-1941  [facility] CM/SW Contact:  Shelbie Hutching, RN Phone Number: 06/30/2019, 12:37 PM   Clinical Narrative:    Patient has chosen a bed at [facility] and will discharge today.  Patient will go to room 30 B and bedside RN will call report to [phone].  [facility] will transport patient at discharge.  Patient has notified family that she is discharging to [facility].    Final next level of care: Skilled Nursing Facility Barriers to Discharge: Barriers Resolved   Patient Goals and CMS Choice Patient states their goals for this hospitalization and ongoing recovery are:: To go home. CMS Medicare.gov Compare Post Acute Care list provided to:: Patient Choice offered to / list presented to : Patient  Discharge Placement              Patient chooses bed at: [facility] Patient to be transferred to facility by: [facility] Name of family member notified: Patient notified family Patient and family notified of of transfer: 06/30/19  Discharge Plan and Services In-house Referral: Clinical Social Work Discharge Planning Services: CM Consult                                 Social Determinants of Health ([place]) Interventions     Readmission Risk Interventions No flowsheet data found.

## 2019-07-03 LAB — SURGICAL PATHOLOGY

## 2019-11-19 ENCOUNTER — Other Ambulatory Visit: Payer: BLUE CROSS/BLUE SHIELD

## 2021-01-28 ENCOUNTER — Other Ambulatory Visit: Payer: Self-pay

## 2021-01-28 ENCOUNTER — Emergency Department: Payer: Medicare Other

## 2021-01-28 ENCOUNTER — Emergency Department
Admission: EM | Admit: 2021-01-28 | Discharge: 2021-01-28 | Disposition: A | Discharge status: Home / Self Care | Payer: Medicare Other | Attending: Emergency Medicine | Admitting: Emergency Medicine

## 2021-01-28 DIAGNOSIS — J45909 Unspecified asthma, uncomplicated: Secondary | ICD-10-CM | POA: Insufficient documentation

## 2021-01-28 DIAGNOSIS — R1032 Left lower quadrant pain: Secondary | ICD-10-CM | POA: Insufficient documentation

## 2021-01-28 DIAGNOSIS — Z85048 Personal history of other malignant neoplasm of rectum, rectosigmoid junction, and anus: Secondary | ICD-10-CM | POA: Insufficient documentation

## 2021-01-28 DIAGNOSIS — Z7901 Long term (current) use of anticoagulants: Secondary | ICD-10-CM | POA: Insufficient documentation

## 2021-01-28 DIAGNOSIS — Z96642 Presence of left artificial hip joint: Secondary | ICD-10-CM | POA: Diagnosis not present

## 2021-01-28 LAB — URINALYSIS, COMPLETE (UACMP) WITH MICROSCOPIC
Bilirubin Urine: NEGATIVE
Glucose, UA: NEGATIVE mg/dL
Ketones, ur: NEGATIVE mg/dL
Leukocytes,Ua: NEGATIVE
Nitrite: NEGATIVE
Protein, ur: NEGATIVE mg/dL
Specific Gravity, Urine: 1.008 (ref 1.005–1.030)
pH: 6 (ref 5.0–8.0)

## 2021-01-28 LAB — CBC WITH DIFFERENTIAL/PLATELET
Abs Immature Granulocytes: 0.08 10*3/uL — ABNORMAL HIGH (ref 0.00–0.07)
Basophils Absolute: 0.1 10*3/uL (ref 0.0–0.1)
Basophils Relative: 1 %
Eosinophils Absolute: 0 10*3/uL (ref 0.0–0.5)
Eosinophils Relative: 0 %
HCT: 41.2 % (ref 36.0–46.0)
Hemoglobin: 13.8 g/dL (ref 12.0–15.0)
Immature Granulocytes: 1 %
Lymphocytes Relative: 7 %
Lymphs Abs: 0.8 10*3/uL (ref 0.7–4.0)
MCH: 32.6 pg (ref 26.0–34.0)
MCHC: 33.5 g/dL (ref 30.0–36.0)
MCV: 97.4 fL (ref 80.0–100.0)
Monocytes Absolute: 1.2 10*3/uL — ABNORMAL HIGH (ref 0.1–1.0)
Monocytes Relative: 9 %
Neutro Abs: 10.5 10*3/uL — ABNORMAL HIGH (ref 1.7–7.7)
Neutrophils Relative %: 82 %
Platelets: 320 10*3/uL (ref 150–400)
RBC: 4.23 MIL/uL (ref 3.87–5.11)
RDW: 13.3 % (ref 11.5–15.5)
WBC: 12.7 10*3/uL — ABNORMAL HIGH (ref 4.0–10.5)
nRBC: 0 % (ref 0.0–0.2)

## 2021-01-28 LAB — COMPREHENSIVE METABOLIC PANEL
ALT: 24 U/L (ref 0–44)
AST: 23 U/L (ref 15–41)
Albumin: 3.9 g/dL (ref 3.5–5.0)
Alkaline Phosphatase: 66 U/L (ref 38–126)
Anion gap: 11 (ref 5–15)
BUN: 16 mg/dL (ref 8–23)
CO2: 27 mmol/L (ref 22–32)
Calcium: 9.6 mg/dL (ref 8.9–10.3)
Chloride: 100 mmol/L (ref 98–111)
Creatinine, Ser: 0.88 mg/dL (ref 0.44–1.00)
GFR, Estimated: >60 mL/min (ref >60)
Glucose, Bld: 116 mg/dL — ABNORMAL HIGH (ref 70–99)
Potassium: 3.5 mmol/L (ref 3.5–5.1)
Sodium: 138 mmol/L (ref 135–145)
Total Bilirubin: 0.8 mg/dL (ref 0.3–1.2)
Total Protein: 7.3 g/dL (ref 6.5–8.1)

## 2021-01-28 LAB — LACTIC ACID, PLASMA
Lactic Acid, Venous: 2.4 mmol/L (ref 0.5–1.9)
Lactic Acid, Venous: 1.1 mmol/L (ref 0.5–1.9)

## 2021-01-28 MED ORDER — HYDROCODONE-ACETAMINOPHEN 5-325 MG PO TABS: 1.0000 | ORAL_TABLET | Freq: Four times a day (QID) | ORAL | 0 refills | Status: AC | PRN

## 2021-01-28 MED ORDER — MORPHINE SULFATE (PF) 4 MG/ML IV SOLN
4.0000 mg | Freq: Once | INTRAVENOUS | Status: AC
Start: 2021-01-28 — End: 2021-01-28
  Administered 2021-01-28: 4 mg via INTRAVENOUS
  Filled 2021-01-28: qty 1

## 2021-01-28 MED ORDER — ONDANSETRON HCL 4 MG/2ML IJ SOLN
4.0000 mg | Freq: Once | INTRAMUSCULAR | Status: AC
  Administered 2021-01-28: 4 mg via INTRAVENOUS
  Filled 2021-01-28: qty 2

## 2021-01-28 MED ORDER — IOHEXOL 300 MG/ML  SOLN
100.0000 mL | Freq: Once | INTRAMUSCULAR | Status: AC | PRN
  Administered 2021-01-28: 100 mL via INTRAVENOUS
  Filled 2021-01-28: qty 100

## 2021-01-28 MED ORDER — SODIUM CHLORIDE 0.9 % IV BOLUS
1000.0000 mL | Freq: Once | INTRAVENOUS | Status: AC
  Administered 2021-01-28: 1000 mL via INTRAVENOUS

## 2021-01-28 MED ORDER — ONDANSETRON 4 MG PO TBDP: 4.0000 mg | ORAL_TABLET | Freq: Three times a day (TID) | ORAL | 0 refills | Status: DC | PRN

## 2021-01-28 NOTE — ED Notes (Signed)
Patient transported to CT 

## 2021-01-28 NOTE — ED Provider Notes (Signed)
[facility] Emergency Department Provider Note  ____________________________________________   Event Date/Time   First MD Initiated Contact with Patient 01/28/21 1400     (approximate)  I have reviewed the triage vital signs and the nursing notes.   HISTORY  Chief Complaint Hip Pain    HPI PRABHJOT MADDUX is a 80 y.o. female presents emergency department with left llq pain  Symptoms started 3 days ago.   Patient had steroid injection to her left knee yesterday.  States the lower abdominal pain has increased today.  Patient has history of anal cancer and colon polyps.  She did not take chemo for these.   Past Medical History:  Diagnosis Date  . Arthritis   . Asthma   . Cancer (HCC)    anal  . Carotid stenosis   . Colon polyp 05/22/2013  . Foot mass   . Glaucoma (increased eye pressure)   . Headache   . Heart murmur   . Hypercholesterolemia   . Hyperlipidemia   . Irregular heartbeat   . Localized primary osteoarthritis of lower leg   . Macular degeneration   . Mitral valve prolapse   . Neck pain   . Osteopenia   . Sarcoidosis   . Sarcoidosis   . Shortness of breath dyspnea    with exertion  . TIA (transient ischemic attack)     Patient Active Problem List   Diagnosis Date Noted  . Hip fracture ([place]) 06/26/2019  . Blood in stool   . Gastritis without bleeding   . Rectal polyp   . Polyp of transverse colon   . GI bleed 04/16/2019  . SOB (shortness of breath) 11/01/2018  . Change in bowel habits 06/07/2017  . External hemorrhoid 06/07/2017  . History of laparoscopic cholecystectomy 06/07/2017  . Cholecystitis, chronic 05/26/2017  . History of rectal or anal cancer 05/26/2017  . History of TIA (transient ischemic attack) 05/26/2017  . Pulmonary HTN ([place]) 05/26/2017  . PAC (premature atrial contraction) 09/15/2016  . Asthma without status asthmaticus 09/25/2015  . Cancer of anal margin 08/20/2015  . Chest pain 07/23/2015  . Chronic  rhinitis 06/04/2015  . Near syncope 09/12/2014  . Primary osteoarthritis of both knees 08/16/2014  . Muscle strain 05/17/2014  . Cephalalgia 04/10/2014  . LBP (low back pain) 04/10/2014  . Cervical pain 04/10/2014  . Decreased potassium in the blood 03/25/2014  . Arthritis of knee, degenerative 01/25/2014  . 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 Open angle with borderline findings, low risk 07/04/2013  . Foot mass 06/05/2013  . Arthritis of foot, degenerative 06/05/2013  . Carotid artery narrowing 03/30/2013  . Cardiac murmur 03/07/2013  . Persons encountering health services in other specified circumstances 10/26/2012  . Referral of patient 10/26/2012  . Airway hyperreactivity 08/25/2012  . Hypercholesterolemia 08/25/2012  . Degeneration macular 08/25/2012  . Carotid stenosis, bilateral 07/21/2012  . Idiopathic localized osteoarthropathy 02/17/2012  . Asthma, mild persistent 02/14/2012  . URI (upper respiratory infection) 11/15/2011  . ALLERGY, FOOD 05/20/2010  . PULMONARY NODULE, LEFT UPPER LOBE 08/06/2008  . Disease of lung 08/06/2008  . Respiratory insufficiency 08/06/2008  . Sarcoidosis with airway involvement  08/22/2007    Past Surgical History:  Procedure Laterality Date  . ABDOMINAL HYSTERECTOMY    . cataract surgery  08/18/09   left eye  . COLONOSCOPY  05/22/2013  . COLONOSCOPY WITH PROPOFOL N/A 09/15/2015   Procedure: COLONOSCOPY WITH PROPOFOL;  Surgeon: Hulen Luster, MD;  Location: [facility] ENDOSCOPY;  Service: Gastroenterology;  Laterality: N/A;  . COLONOSCOPY WITH PROPOFOL N/A 04/17/2019   Procedure: COLONOSCOPY WITH PROPOFOL;  Surgeon: Lucilla Lame, MD;  Location: [facility] ENDOSCOPY;  Service: Endoscopy;  Laterality: N/A;  . DILATION AND CURETTAGE OF UTERUS    . ESOPHAGOGASTRODUODENOSCOPY (EGD) WITH PROPOFOL N/A 04/17/2019   Procedure: ESOPHAGOGASTRODUODENOSCOPY (EGD) WITH PROPOFOL;  Surgeon: Lucilla Lame, MD;  Location: [facility] ENDOSCOPY;   Service: Endoscopy;  Laterality: N/A;  . EYE SURGERY Bilateral    cataract extraction  . HEMORRHOID SURGERY N/A 07/24/2015   Procedure: HEMORRHOIDECTOMY/INTERNAL AND EXTERNAL;  Surgeon: Leonie Green, MD;  Location: [facility] ORS;  Service: General;  Laterality: N/A;  . HIP ARTHROPLASTY Left 06/28/2019   Procedure: ARTHROPLASTY BIPOLAR HIP (HEMIARTHROPLASTY);  Surgeon: Earnestine Leys, MD;  Location: [facility] ORS;  Service: Orthopedics;  Laterality: Left;  . hysterectomy with ovaries intact    . MASS EXCISION N/A 08/21/2015   Procedure: EXCISION MASS/ ANAL MASS;  Surgeon: Leonie Green, MD;  Location: [facility] ORS;  Service: General;  Laterality: N/A;  . TONSILLECTOMY    . TUMOR EXCISION N/A 09/16/2015   Procedure: TUMOR EXCISION RECTAL;  Surgeon: Leonie Green, MD;  Location: [facility] ORS;  Service: General;  Laterality: N/A;    Prior to Admission medications   Medication Sig Start Date End Date Taking? Authorizing Provider  HYDROcodone-acetaminophen (NORCO/VICODIN) 5-325 MG tablet Take 1 tablet by mouth every 6 (six) hours as needed for moderate pain. 01/28/21  Yes Eaden Hettinger, Linden Dolin, PA-C  ondansetron (ZOFRAN-ODT) 4 MG disintegrating tablet Take 1 tablet (4 mg total) by mouth every 8 (eight) hours as needed. 01/28/21  Yes Marylee Belzer, Linden Dolin, PA-C  albuterol (PROAIR HFA) 108 (90 Base) MCG/ACT inhaler Inhale 2 puffs into the lungs every 6 (six) hours as needed for wheezing or shortness of breath. 06/10/16   Tanda Rockers, MD  ALPHAGAN P 0.15 % ophthalmic solution Place 2 drops into the left eye 2 (two) times daily.  06/23/12   [provider]  apixaban (ELIQUIS) 5 MG TABS tablet Take 5 mg by mouth 2 (two) times daily.    [provider]  beclomethasone (QVAR) 80 MCG/ACT inhaler Inhale 1 puff into the lungs 2 (two) times a day. 04/05/19   [provider]  budesonide (ENTOCORT EC) 3 MG 24 hr capsule Take 9 mg by mouth daily.    [provider]  cholecalciferol (VITAMIN D)  1000 UNITS tablet Take 1,000 Units by mouth daily.     [provider]  ferrous sulfate 325 (65 FE) MG tablet Take 1 tablet (325 mg total) by mouth daily with breakfast. 06/30/19   Bettey Costa, MD  furosemide (LASIX) 40 MG tablet Take 40 mg by mouth daily.     [provider]  lidocaine (LIDODERM) 5 % Place 1 patch onto the skin daily as needed. Remove & Discard patch within 12 hours or as directed by MD    [provider]  magnesium oxide (MAG-OX) 400 MG tablet Take 400 mg by mouth daily. 08/06/18   [provider]  Multiple Vitamin (MULTIVITAMIN WITH MINERALS) TABS Take 1 tablet by mouth daily. Centrum Silver    [provider]  Multiple Vitamins-Minerals (PRESERVISION AREDS PO) Take 1 capsule by mouth daily.     [provider]  pantoprazole (PROTONIX) 40 MG tablet Take 1 tablet (40 mg total) by mouth daily. 04/18/19 04/17/20  Epifanio Lesches, MD  potassium chloride (K-DUR) 10 MEQ tablet Take 10-20 mEq by mouth  2 (two) times daily.     [provider]  rosuvastatin (CRESTOR) 5 MG tablet Take 5 mg by mouth every morning.     [provider]  spironolactone (ALDACTONE) 50 MG tablet Take 50 mg by mouth daily.  08/28/18   [provider]  zolpidem (AMBIEN) 5 MG tablet Take 5 mg by mouth at bedtime. 06/21/19   [provider]    Allergies Shellfish-derived products, Aspirin, Diazepam, and Dairy aid [lactase]  Family History  Problem Relation Age of Onset  . Cancer Mother   . CAD Father     Social History Social History   Tobacco Use  . Smoking status: Never Smoker  . Smokeless tobacco: Never Used  Vaping Use  . Vaping Use: Never used  Substance Use Topics  . Alcohol use: No  . Drug use: No    Review of Systems  Constitutional: No fever/chills Eyes: No visual changes. ENT: No sore throat. Respiratory: Denies cough Cardiovascular: Denies chest pain Gastrointestinal: Positive abdominal pain,  left flank Genitourinary: Negative for dysuria. Musculoskeletal: Negative for back pain. Skin: Negative for rash. Psychiatric: no mood changes,     ____________________________________________   PHYSICAL EXAM:  VITAL SIGNS: ED Triage Vitals  Enc Vitals Group     BP 01/28/21 1341 (!) 148/78     Pulse Rate 01/28/21 1341 88     Resp 01/28/21 1341 19     Temp 01/28/21 1341 98.3 F (36.8 C)     Temp Source 01/28/21 1341 Oral     SpO2 01/28/21 1341 97 %     Weight 01/28/21 1339 175 lb (79.4 kg)     Height 01/28/21 1339 5\' 7"  (1.702 m)     Head Circumference --      Peak Flow --      Pain Score 01/28/21 1339 9     Pain Loc --      Pain Edu? --      Excl. in [place]? --     Constitutional: Alert and oriented. Well appearing and in no acute distress. Eyes: Conjunctivae are normal.  Head: Atraumatic. Nose: No congestion/rhinnorhea. Mouth/Throat: Mucous membranes are moist.   Neck:  supple no lymphadenopathy noted Cardiovascular: Normal rate, regular rhythm. Heart sounds are normal Respiratory: Normal respiratory effort.  No retractions, lungs c t a  Abd: soft tender in the left lower quadrant bs normal all 4 quad GU: deferred Musculoskeletal: Patient has difficulty moving the left knee, difficulty moving from sitting on the edge of the bed to a supine position on the bed neurologic:  Normal speech and language.  Skin:  Skin is warm, dry and intact. No rash noted. Psychiatric: Mood and affect are normal. Speech and behavior are normal.  ____________________________________________   LABS (all labs ordered are listed, but only abnormal results are displayed)  Labs Reviewed  COMPREHENSIVE METABOLIC PANEL - Abnormal; Notable for the following components:      Result Value   Glucose, Bld 116 (*)    All other components within normal limits  CBC WITH DIFFERENTIAL/PLATELET - Abnormal; Notable for the following components:   WBC 12.7 (*)    Neutro Abs 10.5 (*)    Monocytes  Absolute 1.2 (*)    Abs Immature Granulocytes 0.08 (*)    All other components within normal limits  LACTIC ACID, PLASMA - Abnormal; Notable for the following components:   Lactic Acid, Venous 2.4 (*)    All other components within normal limits  URINALYSIS, COMPLETE (UACMP) WITH  MICROSCOPIC - Abnormal; Notable for the following components:   Color, Urine YELLOW (*)    APPearance CLEAR (*)    Hgb urine dipstick SMALL (*)    Bacteria, UA RARE (*)    All other components within normal limits  LACTIC ACID, PLASMA   ____________________________________________   ____________________________________________  RADIOLOGY  CT abdomen/pelvis with IV contrast  ____________________________________________   PROCEDURES  Procedure(s) performed: No  Procedures    ____________________________________________   INITIAL IMPRESSION / ASSESSMENT AND PLAN / ED COURSE  Pertinent labs & imaging results that were available during my care of the patient were reviewed by me and considered in my medical decision making (see chart for details).   Patient is 80 year old female presents with abdominal pain.  See HPI physical exam shows patient appears stable at this time  DDx: Diverticulitis, diverticulosis, colon mass, kidney stone, shingles, sepsis  CBC, metabolic panel, lactic acid, UA CT abdomen/pelvis IV contrast   CBC has mild elevation in WBC of 12.7, comprehensive metabolic panel is normal, urinalysis is basically normal, however lactic acid came back as elevated at 2.4.  We did start fluids.  At this time I doubt that she is actually septic.  Vitals are normal and CBC is not that elevated.  Will wait until imaging to consider antibiotics.  CT was reviewed by me, confirmed by radiology.  CT did not show any cause for infection.  Patient's lactic acid returned to normal after 1 L of normal saline.  Feel that she will be safe to be discharged.  Strict precautions given to return if  worsening.  Patient states she feels much better after the pain medication.  I did advise her that a small amount of pain medication will be prescribed to use it cautiously.  She states she understands.  And she will return if worsening.  She discharged stable condition  DAMIRA KEM was evaluated in Emergency Department on 01/28/2021 for the symptoms described in the history of present illness. She was evaluated in the context of the global COVID-19 pandemic, which necessitated consideration that the patient might be at risk for infection with the SARS-CoV-2 virus that causes COVID-19. Institutional protocols and algorithms that pertain to the evaluation of patients at risk for COVID-19 are in a state of rapid change based on information released by regulatory bodies including the CDC and federal and state organizations. These policies and algorithms were followed during the patient's care in the ED.    As part of my medical decision making, I reviewed the following data within the [facility] notes reviewed and incorporated, Labs reviewed , Old chart reviewed, Radiograph reviewed , Notes from prior ED visits and [place] Controlled Substance Database  ____________________________________________   FINAL CLINICAL IMPRESSION(S) / ED DIAGNOSES  Final diagnoses:  LLQ pain      NEW MEDICATIONS STARTED DURING THIS VISIT:  Discharge Medication List as of 01/28/2021  5:40 PM    START taking these medications   Details  HYDROcodone-acetaminophen (NORCO/VICODIN) 5-325 MG tablet Take 1 tablet by mouth every 6 (six) hours as needed for moderate pain., Starting Wed 01/28/2021, Normal    ondansetron (ZOFRAN-ODT) 4 MG disintegrating tablet Take 1 tablet (4 mg total) by mouth every 8 (eight) hours as needed., Starting Wed 01/28/2021, Normal         Note:  This document was prepared using Dragon voice recognition software and may include unintentional dictation errors.    Versie Starks, PA-C 01/28/21 [phone]  Duffy Bruce, MD 02/02/21 2337

## 2021-01-28 NOTE — ED Notes (Signed)
Critical Value  Lactic Acid   PA Ashok Cordia advised

## 2021-01-28 NOTE — ED Notes (Signed)
Discharge instructions reviewed with pt. Pt calm , collective , denied pain or sob. Pt understood discharge instructions . Pt taken to the lobby, pt advised she called daughter for transport .

## 2021-01-28 NOTE — ED Triage Notes (Addendum)
Pt to ER via POV with complaints of L sided hip pain that started three days ago. Denies injury. Difficulty with ambulation without assistance Reports history of surgery on affected side. States she had steroid injections in her R knee this week.

## 2021-01-28 NOTE — Discharge Instructions (Addendum)
Take the pain medication as needed.  Zofran for nausea as needed Return emergency department if worsening Follow-up with your regular doctor as needed

## 2021-07-07 ENCOUNTER — Other Ambulatory Visit: Payer: Self-pay | Admitting: Orthopedic Surgery

## 2021-07-07 DIAGNOSIS — G8929 Other chronic pain: Secondary | ICD-10-CM

## 2021-07-07 DIAGNOSIS — M5442 Lumbago with sciatica, left side: Secondary | ICD-10-CM

## 2021-07-20 ENCOUNTER — Ambulatory Visit
Admission: RE | Admit: 2021-07-20 | Discharge: 2021-07-20 | Disposition: A | Discharge status: Home / Self Care | Payer: Medicare Other | Source: Ambulatory Visit | Attending: Orthopedic Surgery | Admitting: Orthopedic Surgery

## 2021-07-20 DIAGNOSIS — M5442 Lumbago with sciatica, left side: Secondary | ICD-10-CM | POA: Diagnosis present

## 2021-07-20 DIAGNOSIS — G8929 Other chronic pain: Secondary | ICD-10-CM | POA: Diagnosis present

## 2022-12-22 ENCOUNTER — Emergency Department: Payer: Medicare Other

## 2022-12-22 ENCOUNTER — Emergency Department
Admission: EM | Admit: 2022-12-22 | Discharge: 2022-12-22 | Disposition: A | Discharge status: Home / Self Care | Payer: Medicare Other | Attending: Emergency Medicine | Admitting: Emergency Medicine

## 2022-12-22 ENCOUNTER — Other Ambulatory Visit: Payer: Self-pay

## 2022-12-22 DIAGNOSIS — H538 Other visual disturbances: Secondary | ICD-10-CM | POA: Diagnosis not present

## 2022-12-22 DIAGNOSIS — R519 Headache, unspecified: Secondary | ICD-10-CM | POA: Diagnosis present

## 2022-12-22 LAB — COMPREHENSIVE METABOLIC PANEL
ALT: 14 U/L (ref 0–44)
AST: 18 U/L (ref 15–41)
Albumin: 3.8 g/dL (ref 3.5–5.0)
Alkaline Phosphatase: 71 U/L (ref 38–126)
Anion gap: 7 (ref 5–15)
BUN: 21 mg/dL (ref 8–23)
CO2: 28 mmol/L (ref 22–32)
Calcium: 9.4 mg/dL (ref 8.9–10.3)
Chloride: 100 mmol/L (ref 98–111)
Creatinine, Ser: 0.8 mg/dL (ref 0.44–1.00)
GFR, Estimated: >60 mL/min (ref >60)
Glucose, Bld: 99 mg/dL (ref 70–99)
Potassium: 3.9 mmol/L (ref 3.5–5.1)
Sodium: 135 mmol/L (ref 135–145)
Total Bilirubin: 0.8 mg/dL (ref 0.3–1.2)
Total Protein: 6.6 g/dL (ref 6.5–8.1)

## 2022-12-22 LAB — CBC WITH DIFFERENTIAL/PLATELET
Abs Immature Granulocytes: 0.02 10*3/uL (ref 0.00–0.07)
Basophils Absolute: 0.1 10*3/uL (ref 0.0–0.1)
Basophils Relative: 1 %
Eosinophils Absolute: 0.1 10*3/uL (ref 0.0–0.5)
Eosinophils Relative: 1 %
HCT: 36.2 % (ref 36.0–46.0)
Hemoglobin: 11.9 g/dL — ABNORMAL LOW (ref 12.0–15.0)
Immature Granulocytes: 0 %
Lymphocytes Relative: 25 %
Lymphs Abs: 1.2 10*3/uL (ref 0.7–4.0)
MCH: 31.7 pg (ref 26.0–34.0)
MCHC: 32.9 g/dL (ref 30.0–36.0)
MCV: 96.5 fL (ref 80.0–100.0)
Monocytes Absolute: 0.6 10*3/uL (ref 0.1–1.0)
Monocytes Relative: 13 %
Neutro Abs: 2.9 10*3/uL (ref 1.7–7.7)
Neutrophils Relative %: 60 %
Platelets: 280 10*3/uL (ref 150–400)
RBC: 3.75 MIL/uL — ABNORMAL LOW (ref 3.87–5.11)
RDW: 12 % (ref 11.5–15.5)
WBC: 4.8 10*3/uL (ref 4.0–10.5)
nRBC: 0 % (ref 0.0–0.2)

## 2022-12-22 LAB — CBG MONITORING, ED: Glucose-Capillary: 96 mg/dL (ref 70–99)

## 2022-12-22 NOTE — ED Provider Notes (Signed)
[facility] Provider Note    Event Date/Time   First MD Initiated Contact with Patient 12/22/22 [phone]     (approximate)   History   Headache   HPI  Lisa [name] is a 82 y.o. female with a history of pulmonary hypertension, PAF, sarcoidosis, hyperlipidemia, and stroke who presents with headache and blurred vision which occurred this morning and have now resolved.  The patient states that she initially felt a pain on the left side of her head and the temple and then going back towards her ear.  She noted her vision was blurry at that time.  The symptoms lasted for approximately an hour and have now resolved.  The patient states that she had a prior stroke several years ago in which she had a similar headache but then also had left-sided weakness.  She has not been having any weakness or numbness during this episode.  I reviewed the medical records.  The patient was most recently admitted in September 2020 with a hip fracture.  She has no recent ED visits.   Physical Exam   Triage Vital Signs: ED Triage Vitals  Enc Vitals Group     BP 12/22/22 0801 127/77     Pulse Rate 12/22/22 0801 70     Resp 12/22/22 0801 18     Temp 12/22/22 0801 98.2 F (36.8 C)     Temp Source 12/22/22 0801 Oral     SpO2 12/22/22 0801 99 %     Weight 12/22/22 0802 175 lb 0.7 oz (79.4 kg)     Height 12/22/22 0802 '5\' 7"'$  (1.702 m)     Head Circumference --      Peak Flow --      Pain Score 12/22/22 0802 2     Pain Loc --      Pain Edu? --      Excl. in [place]? --     Most recent vital signs: Vitals:   12/22/22 0808 12/22/22 0810  BP: 111/61   Pulse:  64  Resp: 18 18  Temp:    SpO2:  100%     General: Awake, no distress.  CV:  Good peripheral perfusion.  Resp:  Normal effort.  Abd:  No distention.  Other:  EOMI.  PERRLA.  No facial droop.  Cranial nerves III through XII grossly intact.  5/5 motor strength and intact sensation of bilateral upper and lower extremities.  No  pronator drift.  No ataxia on finger-to-nose.  No temporal area tenderness.   ED Results / Procedures / Treatments   Labs (all labs ordered are listed, but only abnormal results are displayed) Labs Reviewed  CBC WITH DIFFERENTIAL/PLATELET - Abnormal; Notable for the following components:      Result Value   RBC 3.75 (*)    Hemoglobin 11.9 (*)    All other components within normal limits  COMPREHENSIVE METABOLIC PANEL  CBG MONITORING, ED     EKG  ED ECG REPORT I, Arta Silence, the attending physician, personally viewed and interpreted this ECG.  Date: 12/22/2022 EKG Time: 0808 Rate: 79 Rhythm: normal sinus rhythm (in correctly read by machine as atrial fibrillation) QRS Axis: normal Intervals: Nonspecific IVCD ST/T Wave abnormalities: normal Narrative Interpretation: no evidence of acute ischemia    RADIOLOGY  CT head: I independently viewed and interpreted the images; there is no ICH.  Radiology report indicates no acute abnormality  MR brain:   IMPRESSION:  1. No evidence of acute infarct  or hemorrhage.  2. Abnormal T2/FLAIR hyperintense signal in the splenium of the  corpus callosum, which is nonspecific but favored to represent  findings related to chronic microvascular ischemic change.   PROCEDURES:  Critical Care performed: No  Procedures   MEDICATIONS ORDERED IN ED: Medications - No data to display   IMPRESSION / MDM / [place] / ED COURSE  I reviewed the triage vital signs and the nursing notes.  82 year old female with PMH as noted above presents with acute onset of blurred vision and headache this morning which have now resolved.  The patient has no other acute symptoms.  Neurologic exam is normal.  Differential diagnosis includes, but is not limited to, near syncope, tension headache, migraine, TIA, CVA.  Given the resolved symptoms and lack of any neurologic deficits there is no indication for code stroke activation.  We will  obtain CT head, lab workup, and reassess.  Patient's presentation is most consistent with acute presentation with potential threat to life or bodily function.  The patient is on the cardiac monitor to evaluate for evidence of arrhythmia and/or significant heart rate changes.  ----------------------------------------- 3:58 PM on 12/22/2022 -----------------------------------------  CT head was negative for acute findings.  I followed this with an MRI of the brain which is also negative for any evidence of acute stroke.  The patient has remained asymptomatic throughout her 6-hour ED stay.  Lab workup is unremarkable with normal electrolytes and no leukocytosis or anemia.  At this time there is no indication of acute CVA, TIA, or other concerning acute pathology.  The patient feels well and is eager to go home.  I counseled her on the results of the workup.  I gave her strict return precautions and she expresses understanding.   FINAL CLINICAL IMPRESSION(S) / ED DIAGNOSES   Final diagnoses:  Acute nonintractable headache, unspecified headache type  Blurred vision     Rx / DC Orders   ED Discharge Orders     None        Note:  This document was prepared using Dragon voice recognition software and may include unintentional dictation errors.    Arta Silence, MD 12/22/22 [phone]

## 2022-12-22 NOTE — ED Triage Notes (Signed)
Pt here with a right side head pain and blurred vision that started 2 days ago. Pt states her head feels weak, pt says she feels the same as when she had her stroke before.

## 2022-12-22 NOTE — Discharge Instructions (Signed)
At this time, there is no sign of a stroke or TIA.  Your symptoms may be due to a type of migraine headache.  You may take over-the-counter Tylenol as needed.  Return to the ER for new, worsening, or persistent severe headache, recurrent or worsening vision changes, difficulty speaking, difficulty with walking or coordination, weakness or numbness in the arms or legs, or any other new or worsening symptoms that concern you.  Follow-up with your regular doctor.

## 2023-06-25 ENCOUNTER — Other Ambulatory Visit: Payer: Self-pay

## 2023-06-25 ENCOUNTER — Emergency Department
Admission: EM | Admit: 2023-06-25 | Discharge: 2023-06-25 | Disposition: A | Discharge status: Home / Self Care | Payer: Medicare Other | Attending: Emergency Medicine | Admitting: Emergency Medicine

## 2023-06-25 DIAGNOSIS — I4891 Unspecified atrial fibrillation: Secondary | ICD-10-CM | POA: Diagnosis not present

## 2023-06-25 DIAGNOSIS — Z7901 Long term (current) use of anticoagulants: Secondary | ICD-10-CM | POA: Insufficient documentation

## 2023-06-25 DIAGNOSIS — K1370 Unspecified lesions of oral mucosa: Secondary | ICD-10-CM | POA: Diagnosis present

## 2023-06-25 NOTE — Discharge Instructions (Signed)
As we discussed if the lesion does not resolve within the next 2 to 3 days please call the number provided for [facility] dermatology Tuesday morning to arrange a follow-up appointment as soon as possible for further evaluation and possible biopsy of this area.  Return to the emergency department for any symptom personally concerning to yourself.

## 2023-06-25 NOTE — ED Provider Notes (Signed)
   [facility] Provider Note    Event Date/Time   First MD Initiated Contact with Patient 06/25/23 2150     (approximate)  History   Chief Complaint: Mouth Lesions  HPI  Lisa [name] is a 82 y.o. female with a past medical history of arthritis, hyperlipidemia, TIA, presents to the emergency department for lesion that she noted in the mouth.  Patient is on Eliquis for atrial fibrillation, states today she noted a bump/lesion in her mouth and feels like it has moved since she first noticed it.  No pain to the area.  Physical Exam   Triage Vital Signs: ED Triage Vitals [06/25/23 1826]  Encounter Vitals Group     BP (!) 141/92     Systolic BP Percentile      Diastolic BP Percentile      Pulse Rate 84     Resp 18     Temp 98.1 F (36.7 C)     Temp Source Oral     SpO2 98 %     Weight      Height      Head Circumference      Peak Flow      Pain Score 6     Pain Loc      Pain Education      Exclude from Growth Chart     Most recent vital signs: Vitals:   06/25/23 1826  BP: (!) 141/92  Pulse: 84  Resp: 18  Temp: 98.1 F (36.7 C)  SpO2: 98%    General: Awake, no distress.  CV:  Good peripheral perfusion.   Resp:  Normal effort.  Abd:  No distention.  Other:  Patient does have a small approximately 1 cm in diameter area to her left lower lip/inner mucous membrane that is palpable not painful.  Concern for either small hematoma versus small mass.  Does not appear mobile.  Less likely sialolithiasis   ED Results / Procedures / Treatments    MEDICATIONS ORDERED IN ED: Medications - No data to display   IMPRESSION / MDM / ASSESSMENT AND PLAN / ED COURSE  I reviewed the triage vital signs and the nursing notes.  Patient's presentation is most consistent with acute illness / injury with system symptoms.  Patient presents emergency department for evaluation of a lesion to her left lower lip/mouth.  On examination it is nontender somewhat  purple discoloration possible hematoma less likely stone, possible mass or tumor.  Discussed with the patient the need to follow-up with dermatology for a biopsy.  Patient agreeable to plan of care.  FINAL CLINICAL IMPRESSION(S) / ED DIAGNOSES   Mouth lesion   Note:  This document was prepared using Dragon voice recognition software and may include unintentional dictation errors.   Minna Antis, MD 06/25/23 2206

## 2023-06-25 NOTE — ED Triage Notes (Signed)
Pt to ED via POV from home. Pt reports inside of top left side of lip noticed a sore that has progressed to inside of bottom lip. Pt reports is on blood thinners. Pt also report it feels like there are ulcers under her tongue. Pt denies any other symptoms.

## 2024-05-10 ENCOUNTER — Other Ambulatory Visit: Payer: Self-pay

## 2024-05-10 ENCOUNTER — Emergency Department
Admission: EM | Admit: 2024-05-10 | Discharge: 2024-05-10 | Disposition: A | Discharge status: Home / Self Care | Attending: Emergency Medicine | Admitting: Emergency Medicine

## 2024-05-10 ENCOUNTER — Encounter: Payer: Self-pay | Admitting: Emergency Medicine

## 2024-05-10 ENCOUNTER — Emergency Department

## 2024-05-10 DIAGNOSIS — Z96642 Presence of left artificial hip joint: Secondary | ICD-10-CM | POA: Insufficient documentation

## 2024-05-10 DIAGNOSIS — J45909 Unspecified asthma, uncomplicated: Secondary | ICD-10-CM | POA: Diagnosis not present

## 2024-05-10 DIAGNOSIS — Z8673 Personal history of transient ischemic attack (TIA), and cerebral infarction without residual deficits: Secondary | ICD-10-CM | POA: Diagnosis not present

## 2024-05-10 DIAGNOSIS — I509 Heart failure, unspecified: Secondary | ICD-10-CM | POA: Diagnosis not present

## 2024-05-10 DIAGNOSIS — Z85048 Personal history of other malignant neoplasm of rectum, rectosigmoid junction, and anus: Secondary | ICD-10-CM | POA: Diagnosis not present

## 2024-05-10 DIAGNOSIS — R2243 Localized swelling, mass and lump, lower limb, bilateral: Secondary | ICD-10-CM | POA: Diagnosis present

## 2024-05-10 LAB — CBC WITH DIFFERENTIAL/PLATELET
Abs Immature Granulocytes: 0.05 K/uL (ref 0.00–0.07)
Basophils Absolute: 0.1 K/uL (ref 0.0–0.1)
Basophils Relative: 1 %
Eosinophils Absolute: 0 K/uL (ref 0.0–0.5)
Eosinophils Relative: 0 %
HCT: 38 % (ref 36.0–46.0)
Hemoglobin: 12.4 g/dL (ref 12.0–15.0)
Immature Granulocytes: 1 %
Lymphocytes Relative: 13 %
Lymphs Abs: 1.2 K/uL (ref 0.7–4.0)
MCH: 32 pg (ref 26.0–34.0)
MCHC: 32.6 g/dL (ref 30.0–36.0)
MCV: 98.2 fL (ref 80.0–100.0)
Monocytes Absolute: 0.8 K/uL (ref 0.1–1.0)
Monocytes Relative: 8 %
Neutro Abs: 7.2 K/uL (ref 1.7–7.7)
Neutrophils Relative %: 77 %
Platelets: 326 K/uL (ref 150–400)
RBC: 3.87 MIL/uL (ref 3.87–5.11)
RDW: 13 % (ref 11.5–15.5)
WBC: 9.3 K/uL (ref 4.0–10.5)
nRBC: 0 % (ref 0.0–0.2)

## 2024-05-10 LAB — HEPATIC FUNCTION PANEL
ALT: 19 U/L (ref 0–44)
AST: 21 U/L (ref 15–41)
Albumin: 3.8 g/dL (ref 3.5–5.0)
Alkaline Phosphatase: 56 U/L (ref 38–126)
Bilirubin, Direct: <0.1 mg/dL (ref 0.0–0.2)
Total Bilirubin: 0.4 mg/dL (ref 0.0–1.2)
Total Protein: 6.9 g/dL (ref 6.5–8.1)

## 2024-05-10 LAB — BASIC METABOLIC PANEL WITH GFR
Anion gap: 10 (ref 5–15)
BUN: 25 mg/dL — ABNORMAL HIGH (ref 8–23)
CO2: 23 mmol/L (ref 22–32)
Calcium: 9.5 mg/dL (ref 8.9–10.3)
Chloride: 105 mmol/L (ref 98–111)
Creatinine, Ser: 0.63 mg/dL (ref 0.44–1.00)
GFR, Estimated: >60 mL/min (ref >60)
Glucose, Bld: 96 mg/dL (ref 70–99)
Potassium: 4.3 mmol/L (ref 3.5–5.1)
Sodium: 138 mmol/L (ref 135–145)

## 2024-05-10 LAB — URINALYSIS, ROUTINE W REFLEX MICROSCOPIC
Bilirubin Urine: NEGATIVE
Glucose, UA: NEGATIVE mg/dL
Hgb urine dipstick: NEGATIVE
Ketones, ur: NEGATIVE mg/dL
Nitrite: POSITIVE — AB
Protein, ur: NEGATIVE mg/dL
Specific Gravity, Urine: 1.024 (ref 1.005–1.030)
pH: 5 (ref 5.0–8.0)

## 2024-05-10 LAB — BRAIN NATRIURETIC PEPTIDE: B Natriuretic Peptide: 23.8 pg/mL (ref 0.0–100.0)

## 2024-05-10 MED ORDER — FUROSEMIDE 40 MG PO TABS
40.0000 mg | ORAL_TABLET | Freq: Once | ORAL | Status: AC
  Administered 2024-05-10: 40 mg via ORAL
  Filled 2024-05-10: qty 1

## 2024-05-10 MED ORDER — FUROSEMIDE 40 MG PO TABS: 40.0000 mg | ORAL_TABLET | Freq: Every day | ORAL | 0 refills | Status: AC

## 2024-05-10 NOTE — ED Provider Notes (Signed)
 [facility] Provider Note    Event Date/Time   First MD Initiated Contact with Patient 05/10/24 1939     (approximate)   History   Chief Complaint: Leg Swelling   HPI  Lisa [name] is a 83 y.o. female with a history of sarcoidosis, CHF on spironolactone  and Lasix  who comes ED complaining of swelling in bilateral ankles for the past few days.  She recently moved residences, has been out of her Lasix  for the past 6 days.  She drinks a lot of water daily by her own account.  Denies chest pain shortness of breath orthopnea.  No fever.        Past Medical History:  Diagnosis Date   Arthritis    Asthma    Cancer (HCC)    anal   Carotid stenosis    Colon polyp 05/22/2013   Foot mass    Glaucoma (increased eye pressure)    Headache    Heart murmur    Hypercholesterolemia    Hyperlipidemia    Irregular heartbeat    Localized primary osteoarthritis of lower leg    Macular degeneration    Mitral valve prolapse    Neck pain    Osteopenia    Sarcoidosis    Sarcoidosis    Shortness of breath dyspnea    with exertion   TIA (transient ischemic attack)     Current Outpatient Rx   Order #: [id number] Class: Historical Med   Order #: [id number] Class: Normal   Order #: [id number] Class: Historical Med   Order #: [id number] Class: Historical Med   Order #: [id number] Class: Historical Med   Order #: [id number] Class: Historical Med   Order #: [id number] Class: Historical Med   Order #: [id number] Class: Print   Order #: [id number] Class: Historical Med   Order #: [id number] Class: Normal   Order #: [id number] Class: Normal   Order #: [id number] Class: Historical Med   Order #: [id number] Class: Historical Med   Order #: [id number] Class: Historical Med   Order #: [id number] Class: Historical Med   Order #: [id number] Class: Normal   Order #: [id number] Class: Print   Order #: [id number] Class: Historical Med   Order #: [id number] Class: Historical Med   Order #: [id number] Class:  Historical Med   Order #: [id number] Class: Historical Med    Past Surgical History:  Procedure Laterality Date   ABDOMINAL HYSTERECTOMY     cataract surgery  08/18/09   left eye   COLONOSCOPY  05/22/2013   COLONOSCOPY WITH PROPOFOL  N/A 09/15/2015   Procedure: COLONOSCOPY WITH PROPOFOL ;  Surgeon: Deward CINDERELLA Piedmont, MD;  Location: [facility] ENDOSCOPY;  Service: Gastroenterology;  Laterality: N/A;   COLONOSCOPY WITH PROPOFOL  N/A 04/17/2019   Procedure: COLONOSCOPY WITH PROPOFOL ;  Surgeon: Jinny Carmine, MD;  Location: [facility] ENDOSCOPY;  Service: Endoscopy;  Laterality: N/A;   DILATION AND CURETTAGE OF UTERUS     ESOPHAGOGASTRODUODENOSCOPY (EGD) WITH PROPOFOL  N/A 04/17/2019   Procedure: ESOPHAGOGASTRODUODENOSCOPY (EGD) WITH PROPOFOL ;  Surgeon: Jinny Carmine, MD;  Location: [facility] ENDOSCOPY;  Service: Endoscopy;  Laterality: N/A;   EYE SURGERY Bilateral    cataract extraction   HEMORRHOID SURGERY N/A 07/24/2015   Procedure: HEMORRHOIDECTOMY/INTERNAL AND EXTERNAL;  Surgeon: Larinda Unknown Sharps, MD;  Location: [facility] ORS;  Service: General;  Laterality: N/A;   HIP ARTHROPLASTY Left 06/28/2019   Procedure: ARTHROPLASTY BIPOLAR HIP (HEMIARTHROPLASTY);  Surgeon: Cleotilde Barrio, MD;  Location: [facility] ORS;  Service: Orthopedics;  Laterality: Left;   hysterectomy with ovaries intact     MASS EXCISION N/A 08/21/2015   Procedure: EXCISION MASS/ ANAL  MASS;  Surgeon: Larinda Unknown Sharps, MD;  Location: [facility] ORS;  Service: General;  Laterality: N/A;   TONSILLECTOMY     TUMOR EXCISION N/A 09/16/2015   Procedure: TUMOR EXCISION RECTAL;  Surgeon: Larinda Unknown Sharps, MD;  Location: [facility] ORS;  Service: General;  Laterality: N/A;    Physical Exam   Triage Vital Signs: ED Triage Vitals [05/10/24 1429]  Encounter Vitals Group     BP 105/82     Girls Systolic BP Percentile      Girls Diastolic BP Percentile      Boys Systolic BP Percentile      Boys Diastolic BP Percentile      Pulse Rate 85     Resp 17     Temp 99.2 F (37.3  C)     Temp Source Oral     SpO2 95 %     Weight 135 lb (61.2 kg)     Height 5' 7 (1.702 m)     Head Circumference      Peak Flow      Pain Score 0     Pain Loc      Pain Education      Exclude from Growth Chart     Most recent vital signs: Vitals:   05/10/24 2030 05/10/24 2101  BP:  124/83  Pulse: 69 75  Resp:  18  Temp:  98.1 F (36.7 C)  SpO2: 100% 100%    General: Awake, no distress.  CV:  Good peripheral perfusion.  Regular rate rhythm Resp:  Normal effort.  Clear to auscultation bilaterally Abd:  No distention.  Soft nontender Other:  1+ pitting edema right foot, 2+ pitting edema left foot.  No inflammatory changes   ED Results / Procedures / Treatments   Labs (all labs ordered are listed, but only abnormal results are displayed) Labs Reviewed  BASIC METABOLIC PANEL WITH GFR - Abnormal; Notable for the following components:      Result Value   BUN 25 (*)    All other components within normal limits  URINALYSIS, ROUTINE W REFLEX MICROSCOPIC - Abnormal; Notable for the following components:   Color, Urine YELLOW (*)    APPearance HAZY (*)    Nitrite POSITIVE (*)    Leukocytes,Ua MODERATE (*)    Bacteria, UA FEW (*)    All other components within normal limits  CBC WITH DIFFERENTIAL/PLATELET  BRAIN NATRIURETIC PEPTIDE  HEPATIC FUNCTION PANEL     EKG    RADIOLOGY Ultrasound lower extremities interpreted by me negative for DVT.  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  DDx: Electrolyte derangement, anemia, AKI, DVT, peripheral edema from diuretic nonuse  Patient's presentation is most consistent with acute presentation with potential threat to life or  bodily function.  Patient presents with bilateral foot swelling in the context of being out of Lasix  for the past 6 days.  No other acute symptoms.  Vital signs and the rest of the exam are reassuring.  Ultrasound negative for DVT.  Labs unremarkable.  Urinalysis suggestive of UTI but patient has no dysuria or other urinary symptoms.  Stable for discharge   Clinical Course as of 05/10/24 2345  Thu May 10, 2024  2030 Lasix  to [facility] [address] - out x 6 days [PS]    Clinical Course User Index [PS] Viviann Pastor, MD     FINAL CLINICAL IMPRESSION(S) / ED DIAGNOSES   Final diagnoses:  Chronic congestive heart failure, unspecified heart failure type [facility])     Rx / DC Orders   ED Discharge Orders          Ordered    furosemide  (LASIX ) 40 MG tablet  Daily        05/10/24 2059             Note:  This document was prepared using Dragon voice recognition software and may include unintentional dictation errors.   Viviann Pastor, MD 05/10/24 [phone]

## 2024-05-10 NOTE — ED Triage Notes (Signed)
 Patient to ED via POV for bilateral feet swelling x3 days. Pt reports that she has been unable to take her blood thinner x1 week. PT states hx of blood clots. Denies SOB.

## 2024-07-05 ENCOUNTER — Emergency Department (HOSPITAL_COMMUNITY)
Admission: EM | Admit: 2024-07-05 | Discharge: 2024-07-06 | Disposition: A | Discharge status: Home / Self Care | Attending: Emergency Medicine | Admitting: Emergency Medicine

## 2024-07-05 ENCOUNTER — Other Ambulatory Visit: Payer: Self-pay

## 2024-07-05 ENCOUNTER — Encounter (HOSPITAL_COMMUNITY): Payer: Self-pay | Admitting: Radiology

## 2024-07-05 DIAGNOSIS — Z7901 Long term (current) use of anticoagulants: Secondary | ICD-10-CM | POA: Insufficient documentation

## 2024-07-05 DIAGNOSIS — R1032 Left lower quadrant pain: Secondary | ICD-10-CM | POA: Diagnosis not present

## 2024-07-05 DIAGNOSIS — R197 Diarrhea, unspecified: Secondary | ICD-10-CM | POA: Insufficient documentation

## 2024-07-05 DIAGNOSIS — D72829 Elevated white blood cell count, unspecified: Secondary | ICD-10-CM | POA: Diagnosis not present

## 2024-07-05 DIAGNOSIS — E871 Hypo-osmolality and hyponatremia: Secondary | ICD-10-CM | POA: Diagnosis not present

## 2024-07-05 LAB — URINALYSIS, ROUTINE W REFLEX MICROSCOPIC
Bilirubin Urine: NEGATIVE
Glucose, UA: NEGATIVE mg/dL
Hgb urine dipstick: NEGATIVE
Ketones, ur: 5 mg/dL — AB
Leukocytes,Ua: NEGATIVE
Nitrite: NEGATIVE
Protein, ur: NEGATIVE mg/dL
Specific Gravity, Urine: 1.02 (ref 1.005–1.030)
pH: 5 (ref 5.0–8.0)

## 2024-07-05 LAB — COMPREHENSIVE METABOLIC PANEL WITH GFR
ALT: 19 U/L (ref 0–44)
AST: 19 U/L (ref 15–41)
Albumin: 3.8 g/dL (ref 3.5–5.0)
Alkaline Phosphatase: 57 U/L (ref 38–126)
Anion gap: 13 (ref 5–15)
BUN: 19 mg/dL (ref 8–23)
CO2: 24 mmol/L (ref 22–32)
Calcium: 9.7 mg/dL (ref 8.9–10.3)
Chloride: 95 mmol/L — ABNORMAL LOW (ref 98–111)
Creatinine, Ser: 0.82 mg/dL (ref 0.44–1.00)
GFR, Estimated: >60 mL/min (ref >60)
Glucose, Bld: 101 mg/dL — ABNORMAL HIGH (ref 70–99)
Potassium: 4.1 mmol/L (ref 3.5–5.1)
Sodium: 132 mmol/L — ABNORMAL LOW (ref 135–145)
Total Bilirubin: 0.9 mg/dL (ref 0.0–1.2)
Total Protein: 6.2 g/dL — ABNORMAL LOW (ref 6.5–8.1)

## 2024-07-05 LAB — CBC
HCT: 39.5 % (ref 36.0–46.0)
Hemoglobin: 12.9 g/dL (ref 12.0–15.0)
MCH: 31.9 pg (ref 26.0–34.0)
MCHC: 32.7 g/dL (ref 30.0–36.0)
MCV: 97.8 fL (ref 80.0–100.0)
Platelets: 347 K/uL (ref 150–400)
RBC: 4.04 MIL/uL (ref 3.87–5.11)
RDW: 12.5 % (ref 11.5–15.5)
WBC: 11.6 K/uL — ABNORMAL HIGH (ref 4.0–10.5)
nRBC: 0 % (ref 0.0–0.2)

## 2024-07-05 LAB — LIPASE, BLOOD: Lipase: 25 U/L (ref 11–51)

## 2024-07-05 NOTE — ED Triage Notes (Signed)
 Abdominal pain with vomiting that started 3 days ago. Today patient is having vomiting and diarrhea. It is causing her to have a pain in her side and she can't remember which side now.

## 2024-07-06 ENCOUNTER — Emergency Department (HOSPITAL_COMMUNITY)

## 2024-07-06 DIAGNOSIS — R197 Diarrhea, unspecified: Secondary | ICD-10-CM | POA: Diagnosis not present

## 2024-07-06 MED ORDER — ONDANSETRON 4 MG PO TBDP: ORAL_TABLET | ORAL | 0 refills | Status: AC

## 2024-07-06 MED ORDER — LOPERAMIDE HCL 2 MG PO CAPS: 2.0000 mg | ORAL_CAPSULE | Freq: Four times a day (QID) | ORAL | 0 refills | Status: AC | PRN

## 2024-07-06 MED ORDER — SODIUM CHLORIDE 0.9 % IV BOLUS
1000.0000 mL | Freq: Once | INTRAVENOUS | Status: AC
  Administered 2024-07-06: 1000 mL via INTRAVENOUS

## 2024-07-06 MED ORDER — IOHEXOL 350 MG/ML SOLN
75.0000 mL | Freq: Once | INTRAVENOUS | Status: AC | PRN
  Administered 2024-07-06: 75 mL via INTRAVENOUS

## 2024-07-06 MED ORDER — ONDANSETRON HCL 4 MG/2ML IJ SOLN
4.0000 mg | Freq: Once | INTRAMUSCULAR | Status: AC
  Administered 2024-07-06: 4 mg via INTRAVENOUS
  Filled 2024-07-06: qty 2

## 2024-07-06 NOTE — ED Provider Notes (Signed)
 [place] EMERGENCY DEPARTMENT AT [facility] Provider Note   CSN: [id number] Arrival date & time: 07/05/24  1700     Patient presents with: Abdominal Pain and Diarrhea   Lisa [name] is a 83 y.o. female.   Presents to the emergency department for evaluation of nausea, vomiting, diarrhea.  Patient reports that symptoms began 3 days ago.  She has having pain in the left lower abdomen.  Vomiting has improved but she is still experiencing frequent diarrhea.  She reports that if she eats or drinks anything it goes right through her.       Prior to Admission medications   Medication Sig Start Date End Date Taking? Authorizing Provider  loperamide  (IMODIUM ) 2 MG capsule Take 1 capsule (2 mg total) by mouth 4 (four) times daily as needed for diarrhea or loose stools. 07/06/24  Yes Jabez Molner, Lonni PARAS, MD  ondansetron  (ZOFRAN -ODT) 4 MG disintegrating tablet 4mg  ODT q4 hours prn nausea/vomit 07/06/24  Yes Yameli Delamater, Lonni PARAS, MD  albuterol  (PROAIR  HFA) 108 (90 Base) MCG/ACT inhaler Inhale 2 puffs into the lungs every 6 (six) hours as needed for wheezing or shortness of breath. 06/10/16   Darlean Ozell NOVAK, MD  ALPHAGAN  P 0.15 % ophthalmic solution Place 2 drops into the left eye 2 (two) times daily.  06/23/12   [provider]  apixaban  (ELIQUIS ) 5 MG TABS tablet Take 5 mg by mouth 2 (two) times daily.    [provider]  beclomethasone (QVAR ) 80 MCG/ACT inhaler Inhale 1 puff into the lungs 2 (two) times a day. 04/05/19   [provider]  budesonide  (ENTOCORT EC ) 3 MG 24 hr capsule Take 9 mg by mouth daily.    [provider]  cholecalciferol  (VITAMIN D ) 1000 UNITS tablet Take 1,000 Units by mouth daily.     [provider]  ferrous sulfate  325 (65 FE) MG tablet Take 1 tablet (325 mg total) by mouth daily with breakfast. 06/30/19   Barbette Cea, MD  furosemide  (LASIX ) 20 MG tablet Take 20 mg by mouth daily.  08/23/16 07/28/17  [provider]  furosemide  (LASIX ) 20 MG tablet Take 20 mg by mouth daily. 05/10/24 05/10/25    furosemide  (LASIX ) 40 MG tablet Take 1 tablet (40 mg total) by mouth daily. 05/10/24   Viviann Pastor, MD  HYDROcodone -acetaminophen  (NORCO/VICODIN) 5-325 MG tablet Take 1 tablet by mouth every 6 (six) hours as needed for moderate pain. 01/28/21   Fisher, Devere ORN, PA-C  lidocaine  (LIDODERM ) 5 % Place 1 patch onto the skin daily as needed. Remove & Discard patch within 12 hours or as directed by MD    [provider]  magnesium  oxide (MAG-OX) 400 MG tablet Take 400 mg by mouth daily. 08/06/18   [provider]  Multiple Vitamin (MULTIVITAMIN WITH MINERALS) TABS Take 1 tablet by mouth daily. Centrum Silver    [provider]  Multiple Vitamins-Minerals (PRESERVISION AREDS PO) Take 1 capsule by mouth daily.     [provider]  pantoprazole  (PROTONIX ) 40 MG tablet Take 1 tablet (40 mg total) by mouth daily. 04/18/19 04/17/20  Almeda Bernard, MD  potassium chloride  (K-DUR) 10 MEQ tablet Take 10-20 mEq by mouth 2 (two) times daily.     [provider]  rosuvastatin  (CRESTOR ) 5 MG tablet Take 5 mg by mouth every morning.     [provider]  spironolactone  (ALDACTONE ) 50 MG tablet Take 50 mg by mouth daily.  08/28/18   [provider]  zolpidem  (AMBIEN ) 5 MG tablet Take 5 mg by mouth at bedtime. 06/21/19   [provider]    Allergies: Shellfish-derived products, Aspirin , Diazepam, and Dairy aid [tilactase]    Review of Systems  Updated Vital Signs BP 113/64   Pulse 64   Temp 98.1 F (36.7 C) (Oral)   Resp 20   Ht 5' 7 (1.702 m)   Wt 61.2 kg   SpO2 100%   BMI 21.14 kg/m   Physical Exam Vitals and nursing note reviewed.  Constitutional:      General: She is not in acute distress.    Appearance: She is well-developed.  HENT:     Head: Normocephalic and atraumatic.     Mouth/Throat:     Mouth: Mucous membranes are moist.   Eyes:     General: Vision grossly intact. Gaze aligned appropriately.     Extraocular Movements: Extraocular movements intact.     Conjunctiva/sclera: Conjunctivae normal.  Cardiovascular:     Rate and Rhythm: Normal rate and regular rhythm.     Pulses: Normal pulses.     Heart sounds: Normal heart sounds, S1 normal and S2 normal. No murmur heard.    No friction rub. No gallop.  Pulmonary:     Effort: Pulmonary effort is normal. No respiratory distress.     Breath sounds: Normal breath sounds.  Abdominal:     General: Bowel sounds are normal.     Palpations: Abdomen is soft.     Tenderness: There is abdominal tenderness in the left lower quadrant. There is no guarding or rebound.     Hernia: No hernia is present.  Musculoskeletal:        General: No swelling.     Cervical back: Full passive range of motion without pain, normal range of motion and neck supple. No spinous process tenderness or muscular tenderness. Normal range of motion.     Right lower leg: No edema.     Left lower leg: No edema.  Skin:    General: Skin is warm and dry.     Capillary Refill: Capillary refill takes less than 2 seconds.     Findings: No ecchymosis, erythema, rash or wound.  Neurological:     General: No focal deficit present.     Mental Status: She is alert and oriented to person, place, and time.     GCS: GCS eye subscore is 4. GCS verbal subscore is 5. GCS motor subscore is 6.     Cranial Nerves: Cranial nerves 2-12 are intact.     Sensory: Sensation is intact.     Motor: Motor function is intact.     Coordination: Coordination is intact.  Psychiatric:        Attention and Perception: Attention normal.        Mood and Affect: Mood normal.        Speech: Speech normal.        Behavior: Behavior normal.     (all labs ordered are listed, but only abnormal results are displayed) Labs Reviewed  COMPREHENSIVE METABOLIC PANEL WITH GFR - Abnormal; Notable for the following components:       Result Value   Sodium 132 (*)    Chloride 95 (*)    Glucose, Bld 101 (*)    Total Protein 6.2 (*)    All other components within normal limits  CBC - Abnormal; Notable for the following components:   WBC 11.6 (*)    All other components within  normal limits  URINALYSIS, ROUTINE W REFLEX MICROSCOPIC - Abnormal; Notable for the following components:   APPearance HAZY (*)    Ketones, ur 5 (*)    All other components within normal limits  LIPASE, BLOOD    EKG: None  Radiology: CT ABDOMEN PELVIS W CONTRAST Result Date: 07/06/2024 CLINICAL DATA:  83 year old female with abdominal pain and vomiting onset 3 days ago. Diarrhea. EXAM: CT ABDOMEN AND PELVIS WITH CONTRAST TECHNIQUE: Multidetector CT imaging of the abdomen and pelvis was performed using the standard protocol following bolus administration of intravenous contrast. RADIATION DOSE REDUCTION: This exam was performed according to the departmental dose-optimization program which includes automated exposure control, adjustment of the mA and/or kV according to patient size and/or use of iterative reconstruction technique. CONTRAST:  75mL OMNIPAQUE  IOHEXOL  350 MG/ML SOLN COMPARISON:  CT Abdomen and Pelvis 01/28/2021. FINDINGS: Lower chest: Mild respiratory motion. Chronic calcified posterior mediastinal lymph nodes appear to be postinflammatory. Chronic right lower lung posterior pleural thickening is stable since 2022. Visible mediastinum and lung bases otherwise negative. Hepatobiliary: Chronically absent gallbladder. Liver appears stable since 2022 with mild intra-and extrahepatic bile duct enlargement which is likely postoperative. Pancreas: Within normal limits. Spleen: Diminutive, negative. Adrenals/Urinary Tract: Negative adrenal glands. Nonobstructed kidneys with symmetric renal enhancement and contrast excretion. Normal proximal ureters. No nephrolithiasis identified. Urinary bladder is within normal limits. Chronic pelvic phleboliths.  Stomach/Bowel: Redundant large bowel contains fluid throughout the abdomen and pelvis. Widespread large bowel diverticulosis, most pronounced in the sigmoid colon. No discrete large bowel inflammation identified. Cecum is decompressed and appendix is decompressed and normal on coronal image 48. Nondilated intermittently fluid containing small bowel loops in the abdomen and pelvis. Both small and large bowel loops demonstrate possible generalized mucosal hyperenhancement (coronal image 62). Small gastric hiatal hernia. Decompressed stomach. Second portion duodenal diverticulum with no active inflammation. No pneumoperitoneum. No free fluid. No discrete mesenteric inflammation. Vascular/Lymphatic: Aortoiliac calcified atherosclerosis. Major arterial structures remain patent. Normal caliber abdominal aorta. Portal venous system appears patent. No lymphadenopathy identified. Reproductive: Chronically absent uterus, diminutive or absent ovaries. Other: No pelvis free fluid. Musculoskeletal: Previously augmented T11 and T12 compression fractures are partially visible. Chronic L2 and L3 superior endplate compression is stable since 2022. L1, L4 and L5 appears stable and intact with chronic degeneration. Underlying osteopenia. Chronic left hip arthroplasty. No acute osseous abnormality identified. IMPRESSION: 1. Fluid throughout the large bowel compatible with Diarrhea. And questionable generalized small and large bowel mucosal hyperenhancement such as with acute Enteritis/Enterocolitis. Extensive large bowel diverticulosis but no focal bowel inflammation. Normal appendix. 2. No other acute or inflammatory process identified in the abdomen or pelvis. 3. Aortic Atherosclerosis (ICD10-I70.0). Chronic postoperative changes, including interval lower thoracic vertebral body augmentation. Electronically Signed   By: VEAR Hurst M.D.   On: 07/06/2024 06:19     Procedures   Medications Ordered in the ED  sodium chloride  0.9 %  bolus 1,000 mL (1,000 mLs Intravenous New Bag/Given 07/06/24 0433)  ondansetron  (ZOFRAN ) injection 4 mg (4 mg Intravenous Given 07/06/24 0434)  iohexol  (OMNIPAQUE ) 350 MG/ML injection 75 mL (75 mLs Intravenous Contrast Given 07/06/24 0453)                                    Medical Decision Making Amount and/or Complexity of Data Reviewed Labs: ordered. Decision-making details documented in ED Course. Radiology: ordered and independent interpretation performed. Decision-making details documented in ED Course.  Risk Prescription drug management.   Differential Diagnosis considered includes, but not limited to: Appendicitis; colitis; diverticulitis; bowel obstruction; hernia; cystitis; nephrolithiasis; pyelonephritis.   Presents to the emergency department for evaluation of abdominal discomfort with diarrhea.  Patient reports symptoms for 3 days.  She had nausea and vomiting initially now watery diarrhea.  No bleeding.  Examination reveals some mild left lower quadrant tenderness, no guarding or rebound.  Lab work was unremarkable.  Vitals are unremarkable.  CT scan was performed to further evaluate.  No evidence of colitis or diverticulitis.  Fluid-filled colon consistent with stated history of diarrhea.  Patient given IV fluids here in the ED, treat with antidiarrheals.  Workup reassuring, does not require hospitalization.     Final diagnoses:  Diarrhea of presumed infectious origin    ED Discharge Orders          Ordered    loperamide  (IMODIUM ) 2 MG capsule  4 times daily PRN        07/06/24 0631    ondansetron  (ZOFRAN -ODT) 4 MG disintegrating tablet        07/06/24 0631               Haze Lonni PARAS, MD 07/06/24 [phone]

## 2024-07-06 NOTE — ED Notes (Signed)
 Patient transported to CT

## 2024-08-11 ENCOUNTER — Emergency Department
Admission: EM | Admit: 2024-08-11 | Discharge: 2024-08-11 | Disposition: A | Discharge status: Home / Self Care | Attending: Emergency Medicine | Admitting: Emergency Medicine

## 2024-08-11 ENCOUNTER — Emergency Department

## 2024-08-11 ENCOUNTER — Other Ambulatory Visit: Payer: Self-pay

## 2024-08-11 DIAGNOSIS — J45909 Unspecified asthma, uncomplicated: Secondary | ICD-10-CM | POA: Insufficient documentation

## 2024-08-11 DIAGNOSIS — I509 Heart failure, unspecified: Secondary | ICD-10-CM | POA: Insufficient documentation

## 2024-08-11 DIAGNOSIS — R079 Chest pain, unspecified: Secondary | ICD-10-CM | POA: Insufficient documentation

## 2024-08-11 LAB — BASIC METABOLIC PANEL WITH GFR
Anion gap: 8 (ref 5–15)
Anion gap: 11 (ref 5–15)
BUN: 48 mg/dL — ABNORMAL HIGH (ref 8–23)
BUN: 44 mg/dL — ABNORMAL HIGH (ref 8–23)
CO2: 23 mmol/L (ref 22–32)
CO2: 23 mmol/L (ref 22–32)
Calcium: 8.6 mg/dL — ABNORMAL LOW (ref 8.9–10.3)
Calcium: 8.4 mg/dL — ABNORMAL LOW (ref 8.9–10.3)
Chloride: 101 mmol/L (ref 98–111)
Chloride: 98 mmol/L (ref 98–111)
Creatinine, Ser: 1 mg/dL (ref 0.44–1.00)
Creatinine, Ser: 0.85 mg/dL (ref 0.44–1.00)
GFR, Estimated: 55 mL/min — ABNORMAL LOW (ref >60)
GFR, Estimated: >60 mL/min (ref >60)
Glucose, Bld: 88 mg/dL (ref 70–99)
Glucose, Bld: 87 mg/dL (ref 70–99)
Potassium: 6.2 mmol/L — ABNORMAL HIGH (ref 3.5–5.1)
Potassium: 4.1 mmol/L (ref 3.5–5.1)
Sodium: 132 mmol/L — ABNORMAL LOW (ref 135–145)
Sodium: 132 mmol/L — ABNORMAL LOW (ref 135–145)

## 2024-08-11 LAB — CBC
HCT: 36.1 % (ref 36.0–46.0)
Hemoglobin: 12 g/dL (ref 12.0–15.0)
MCH: 32.7 pg (ref 26.0–34.0)
MCHC: 33.2 g/dL (ref 30.0–36.0)
MCV: 98.4 fL (ref 80.0–100.0)
Platelets: 258 K/uL (ref 150–400)
RBC: 3.67 MIL/uL — ABNORMAL LOW (ref 3.87–5.11)
RDW: 12.7 % (ref 11.5–15.5)
WBC: 9.7 K/uL (ref 4.0–10.5)
nRBC: 0 % (ref 0.0–0.2)

## 2024-08-11 LAB — PROTIME-INR
INR: 0.9 (ref 0.8–1.2)
Prothrombin Time: 12.3 s (ref 11.4–15.2)

## 2024-08-11 LAB — TROPONIN I (HIGH SENSITIVITY)
Troponin I (High Sensitivity): 7 ng/L (ref <18)
Troponin I (High Sensitivity): 6 ng/L (ref <18)

## 2024-08-11 NOTE — ED Provider Notes (Signed)
 [facility] Provider Note    Event Date/Time   First MD Initiated Contact with Patient 08/11/24 [phone]     (approximate)  History   Chief Complaint: Chest Pain  HPI  LORMA HEATER is a 83 y.o. female with a past medical history of asthma, arthritis, CHF, presents to the emergency department for chest pain and an accidental medication overdose.  According to the patient she picked up a prescription for furosemide  but she was already taking Lasix .  Patient did not know they were the same medication and yesterday took a double dose (80 mg) instead of her normal 40 mg.  Patient states she called poison control and as she was urinating a significant amount yesterday they recommended that she come to the hospital.  Patient states she did not go to the hospital because she had a funeral to attend to.  However overnight she began experiencing some discomfort in the chest.  Patient states a dull discomfort in the center of the chest and because she was supposed to come to the hospital yesterday but did not and given the chest discomfort she thought she better come get checked out.  Patient states the chest pain has largely resolved.  Denies any shortness of breath at any point.  Physical Exam   Triage Vital Signs: ED Triage Vitals  Encounter Vitals Group     BP 08/11/24 0435 132/79     Girls Systolic BP Percentile --      Girls Diastolic BP Percentile --      Boys Systolic BP Percentile --      Boys Diastolic BP Percentile --      Pulse Rate 08/11/24 0426 65     Resp 08/11/24 0426 16     Temp 08/11/24 0426 97.7 F (36.5 C)     Temp Source 08/11/24 0426 Oral     SpO2 08/11/24 0426 99 %     Weight 08/11/24 0430 135 lb (61.2 kg)     Height 08/11/24 0430 5' 7 (1.702 m)     Head Circumference --      Peak Flow --      Pain Score 08/11/24 0430 8     Pain Loc --      Pain Education --      Exclude from Growth Chart --     Most recent vital signs: Vitals:    08/11/24 0426 08/11/24 0435  BP:  132/79  Pulse: 65   Resp: 16   Temp: 97.7 F (36.5 C)   SpO2: 99%     General: Awake, no distress.  CV:  Good peripheral perfusion.  Regular rate and rhythm  Resp:  Normal effort.  Equal breath sounds bilaterally.  Abd:  No distention.  Soft, nontender.  No rebound or guarding.  ED Results / Procedures / Treatments   EKG  EKG viewed and interpreted by myself shows a normal sinus rhythm at 68 bpm with a narrow QRS, left axis deviation, largely normal intervals, nonspecific ST changes  RADIOLOGY  I have reviewed interpret the chest x-ray images.  No obvious consolidation on my evaluation. Radiology has read the x-ray is negative for acute process.   MEDICATIONS ORDERED IN ED: Medications - No data to display   IMPRESSION / MDM / ASSESSMENT AND PLAN / ED COURSE  I reviewed the triage vital signs and the nursing notes.  Patient's presentation is most consistent with acute presentation with potential threat to life or bodily function.  Patient presents to the emergency department with concerns over a medication overdose (80 mg of Lasix  instead of 40 mg) yesterday as well as some chest discomfort overnight.  Overall the patient appears well, no distress.  Patient states she thinks she just got herself worried due to the medication incident.  Patient denies any chest pain currently.  Denies any shortness of breath nausea or diaphoresis.  Reassuring vital signs.  Reassuring physical exam.  Given the patient's chest pain we will check labs including cardiac enzymes x 2.  Given the patient's accidental Lasix  overmedication we will check labs including a chemistry to evaluate for dehydration or renal dysfunction.  Patient agreeable to plan of care and workup.   Patient's workup shows a reassuring CBC, reassuring troponin.  Chest x-ray is negative for acute abnormality.  Patient's chemistry has resulted showing hyperkalemia although labs states there was  hemolysis present.  We will recheck a chemistry as well as a repeat troponin.  Patient agreeable to plan.  Repeat troponin remains negative.  Patient's chemistry is reassuring with a normal potassium on recheck.  FINAL CLINICAL IMPRESSION(S) / ED DIAGNOSES   Chest pain    Note:  This document was prepared using Dragon voice recognition software and may include unintentional dictation errors.   Dorothyann Drivers, MD 08/11/24 [phone]

## 2024-08-11 NOTE — ED Notes (Signed)
 Answered call light, pt advised she is finished using the bathroom, connected pt back up to the monitor. No further needs at this time.

## 2024-08-11 NOTE — ED Triage Notes (Signed)
 Patient brought in by daughter for mid sternal chest pain x90 min, denies SoB and nausea. Pt also reports she was instructed to come to ED yesterday by Poison Control after accidentally taking lasix  80mg  instead of 40mg  yesterday x2. AxOx4, ambulatory with cane.
